# Patient Record
Sex: Female | Born: 1945
Health system: Southern US, Community
[De-identification: ages and names within clinical notes are randomized; demographics above are authoritative.]

## PROBLEM LIST (undated history)

## (undated) DIAGNOSIS — M5126 Other intervertebral disc displacement, lumbar region: Secondary | ICD-10-CM

## (undated) DIAGNOSIS — I10 Essential (primary) hypertension: Secondary | ICD-10-CM

## (undated) DIAGNOSIS — Z860100 Personal history of colon polyps, unspecified: Secondary | ICD-10-CM

## (undated) DIAGNOSIS — F419 Anxiety disorder, unspecified: Secondary | ICD-10-CM

## (undated) DIAGNOSIS — D739 Disease of spleen, unspecified: Secondary | ICD-10-CM

## (undated) DIAGNOSIS — J189 Pneumonia, unspecified organism: Secondary | ICD-10-CM

## (undated) DIAGNOSIS — C4371 Malignant melanoma of right lower limb, including hip: Secondary | ICD-10-CM

## (undated) DIAGNOSIS — E785 Hyperlipidemia, unspecified: Secondary | ICD-10-CM

## (undated) DIAGNOSIS — K635 Polyp of colon: Secondary | ICD-10-CM

## (undated) DIAGNOSIS — Z85820 Personal history of malignant melanoma of skin: Secondary | ICD-10-CM

## (undated) DIAGNOSIS — J309 Allergic rhinitis, unspecified: Secondary | ICD-10-CM

## (undated) DIAGNOSIS — B0050 Herpesviral ocular disease, unspecified: Secondary | ICD-10-CM

## (undated) DIAGNOSIS — K219 Gastro-esophageal reflux disease without esophagitis: Secondary | ICD-10-CM

## (undated) DIAGNOSIS — M8580 Other specified disorders of bone density and structure, unspecified site: Secondary | ICD-10-CM

## (undated) DIAGNOSIS — M1990 Unspecified osteoarthritis, unspecified site: Secondary | ICD-10-CM

## (undated) DIAGNOSIS — I6529 Occlusion and stenosis of unspecified carotid artery: Secondary | ICD-10-CM

## (undated) DIAGNOSIS — K760 Fatty (change of) liver, not elsewhere classified: Secondary | ICD-10-CM

## (undated) DIAGNOSIS — R739 Hyperglycemia, unspecified: Secondary | ICD-10-CM

## (undated) DIAGNOSIS — Z8719 Personal history of other diseases of the digestive system: Secondary | ICD-10-CM

## (undated) HISTORY — DX: Personal history of colonic polyps: Z86.010

## (undated) HISTORY — DX: Allergic rhinitis, unspecified: J30.9

## (undated) HISTORY — DX: Hyperglycemia, unspecified: R73.9

## (undated) HISTORY — DX: Polyp of colon: K63.5

## (undated) HISTORY — DX: Disease of spleen, unspecified: D73.9

## (undated) HISTORY — PX: CARPAL TUNNEL RELEASE: SHX101

## (undated) HISTORY — DX: Malignant melanoma of right lower limb, including hip: C43.71

## (undated) HISTORY — DX: Gastro-esophageal reflux disease without esophagitis: K21.9

## (undated) HISTORY — DX: Other intervertebral disc displacement, lumbar region: M51.26

## (undated) HISTORY — DX: Other specified disorders of bone density and structure, unspecified site: M85.80

## (undated) HISTORY — DX: Personal history of colon polyps, unspecified: Z86.0100

## (undated) HISTORY — PX: NECK SURGERY: SHX720

## (undated) HISTORY — DX: Occlusion and stenosis of unspecified carotid artery: I65.29

## (undated) HISTORY — DX: Fatty (change of) liver, not elsewhere classified: K76.0

## (undated) HISTORY — DX: Personal history of malignant melanoma of skin: Z85.820

## (undated) HISTORY — DX: Hyperlipidemia, unspecified: E78.5

## (undated) HISTORY — PX: TONSILLECTOMY: SUR1361

---

## 1983-10-12 HISTORY — PX: ABDOMINAL HYSTERECTOMY: SHX81

## 1998-03-26 ENCOUNTER — Other Ambulatory Visit: Admission: RE | Admit: 1998-03-26 | Discharge: 1998-03-26 | Payer: Self-pay | Admitting: Obstetrics and Gynecology

## 1999-05-28 ENCOUNTER — Other Ambulatory Visit: Admission: RE | Admit: 1999-05-28 | Discharge: 1999-05-28 | Payer: Self-pay | Admitting: Obstetrics and Gynecology

## 1999-09-18 ENCOUNTER — Encounter: Admission: RE | Admit: 1999-09-18 | Discharge: 1999-09-18 | Payer: Self-pay | Admitting: Obstetrics and Gynecology

## 1999-09-18 ENCOUNTER — Encounter: Payer: Self-pay | Admitting: Obstetrics and Gynecology

## 2000-07-22 ENCOUNTER — Encounter (INDEPENDENT_AMBULATORY_CARE_PROVIDER_SITE_OTHER): Payer: Self-pay | Admitting: Specialist

## 2000-07-22 ENCOUNTER — Other Ambulatory Visit: Admission: RE | Admit: 2000-07-22 | Discharge: 2000-07-22 | Payer: Self-pay | Admitting: Gastroenterology

## 2000-09-20 ENCOUNTER — Encounter: Payer: Self-pay | Admitting: Obstetrics and Gynecology

## 2000-09-20 ENCOUNTER — Encounter: Admission: RE | Admit: 2000-09-20 | Discharge: 2000-09-20 | Payer: Self-pay | Admitting: Obstetrics and Gynecology

## 2000-09-22 ENCOUNTER — Other Ambulatory Visit: Admission: RE | Admit: 2000-09-22 | Discharge: 2000-09-22 | Payer: Self-pay | Admitting: Family Medicine

## 2001-09-21 ENCOUNTER — Encounter: Admission: RE | Admit: 2001-09-21 | Discharge: 2001-09-21 | Payer: Self-pay | Admitting: Family Medicine

## 2001-09-21 ENCOUNTER — Encounter: Payer: Self-pay | Admitting: Family Medicine

## 2001-09-26 ENCOUNTER — Other Ambulatory Visit: Admission: RE | Admit: 2001-09-26 | Discharge: 2001-09-26 | Payer: Self-pay | Admitting: Family Medicine

## 2002-09-25 ENCOUNTER — Encounter: Admission: RE | Admit: 2002-09-25 | Discharge: 2002-09-25 | Payer: Self-pay | Admitting: Family Medicine

## 2002-09-25 ENCOUNTER — Encounter: Payer: Self-pay | Admitting: Family Medicine

## 2003-10-01 ENCOUNTER — Encounter: Admission: RE | Admit: 2003-10-01 | Discharge: 2003-10-01 | Payer: Self-pay | Admitting: Obstetrics and Gynecology

## 2003-11-21 ENCOUNTER — Encounter: Admission: RE | Admit: 2003-11-21 | Discharge: 2003-11-21 | Payer: Self-pay | Admitting: Gastroenterology

## 2004-09-11 ENCOUNTER — Ambulatory Visit: Payer: Self-pay | Admitting: Family Medicine

## 2004-09-21 ENCOUNTER — Ambulatory Visit: Payer: Self-pay | Admitting: Family Medicine

## 2004-09-28 ENCOUNTER — Ambulatory Visit: Payer: Self-pay | Admitting: Family Medicine

## 2004-12-16 ENCOUNTER — Ambulatory Visit: Payer: Self-pay | Admitting: Family Medicine

## 2005-01-06 ENCOUNTER — Ambulatory Visit (HOSPITAL_COMMUNITY): Admission: RE | Admit: 2005-01-06 | Discharge: 2005-01-07 | Payer: Self-pay | Admitting: Orthopaedic Surgery

## 2005-03-09 ENCOUNTER — Ambulatory Visit: Payer: Self-pay | Admitting: Gastroenterology

## 2005-03-11 ENCOUNTER — Ambulatory Visit: Payer: Self-pay | Admitting: Gastroenterology

## 2005-10-11 HISTORY — PX: CHOLECYSTECTOMY: SHX55

## 2007-01-06 ENCOUNTER — Ambulatory Visit: Payer: Self-pay | Admitting: Family Medicine

## 2007-01-06 LAB — CONVERTED CEMR LAB
Albumin: 4.7 g/dL (ref 3.5–5.2)
Alkaline Phosphatase: 57 units/L (ref 39–117)
Calcium: 10.1 mg/dL (ref 8.4–10.5)
Chloride: 100 meq/L (ref 96–112)
Eosinophils Absolute: 0.1 10*3/uL (ref 0.0–0.7)
HCT: 40.6 % (ref 36.0–46.0)
HDL: 56 mg/dL (ref >39)
Hemoglobin: 14.1 g/dL (ref 12.0–15.0)
LDL Cholesterol: 213 mg/dL — ABNORMAL HIGH (ref 0–99)
Lymphocytes Relative: 42 % (ref 12–46)
Lymphs Abs: 2.6 10*3/uL (ref 0.7–3.3)
Monocytes Absolute: 0.4 10*3/uL (ref 0.2–0.7)
Monocytes Relative: 7 % (ref 3–11)
Neutrophils Relative %: 49 % (ref 43–77)
TSH: 2.114 microintl units/mL (ref 0.350–5.50)
Total Bilirubin: 0.4 mg/dL (ref 0.3–1.2)
Total Protein: 7.7 g/dL (ref 6.0–8.3)
Triglycerides: 224 mg/dL — ABNORMAL HIGH (ref <150)
VLDL: 45 mg/dL — ABNORMAL HIGH (ref 0–40)

## 2007-01-14 ENCOUNTER — Emergency Department (HOSPITAL_COMMUNITY): Admission: EM | Admit: 2007-01-14 | Discharge: 2007-01-14 | Payer: Self-pay | Admitting: Emergency Medicine

## 2007-01-16 ENCOUNTER — Ambulatory Visit: Payer: Self-pay | Admitting: Family Medicine

## 2007-01-19 ENCOUNTER — Encounter: Admission: RE | Admit: 2007-01-19 | Discharge: 2007-01-19 | Payer: Self-pay | Admitting: Family Medicine

## 2007-01-20 ENCOUNTER — Ambulatory Visit: Payer: Self-pay | Admitting: Family Medicine

## 2007-02-07 ENCOUNTER — Encounter: Payer: Self-pay | Admitting: Family Medicine

## 2007-02-20 ENCOUNTER — Encounter: Admission: RE | Admit: 2007-02-20 | Discharge: 2007-02-20 | Payer: Self-pay | Admitting: General Surgery

## 2007-02-23 ENCOUNTER — Encounter: Payer: Self-pay | Admitting: Family Medicine

## 2007-02-23 DIAGNOSIS — K219 Gastro-esophageal reflux disease without esophagitis: Secondary | ICD-10-CM | POA: Insufficient documentation

## 2007-02-23 DIAGNOSIS — I10 Essential (primary) hypertension: Secondary | ICD-10-CM | POA: Insufficient documentation

## 2007-02-23 DIAGNOSIS — M1990 Unspecified osteoarthritis, unspecified site: Secondary | ICD-10-CM | POA: Insufficient documentation

## 2007-03-03 ENCOUNTER — Telehealth (INDEPENDENT_AMBULATORY_CARE_PROVIDER_SITE_OTHER): Payer: Self-pay | Admitting: *Deleted

## 2007-03-08 ENCOUNTER — Ambulatory Visit: Payer: Self-pay | Admitting: Gastroenterology

## 2007-05-01 ENCOUNTER — Emergency Department (HOSPITAL_COMMUNITY): Admission: EM | Admit: 2007-05-01 | Discharge: 2007-05-01 | Payer: Self-pay | Admitting: Family Medicine

## 2007-06-06 ENCOUNTER — Ambulatory Visit (HOSPITAL_COMMUNITY): Admission: RE | Admit: 2007-06-06 | Discharge: 2007-06-06 | Payer: Self-pay | Admitting: General Surgery

## 2007-06-06 ENCOUNTER — Encounter: Payer: Self-pay | Admitting: Family Medicine

## 2007-06-06 ENCOUNTER — Encounter (INDEPENDENT_AMBULATORY_CARE_PROVIDER_SITE_OTHER): Payer: Self-pay | Admitting: General Surgery

## 2007-07-11 ENCOUNTER — Encounter: Payer: Self-pay | Admitting: Family Medicine

## 2007-11-02 ENCOUNTER — Ambulatory Visit: Payer: Self-pay | Admitting: Family Medicine

## 2007-11-02 DIAGNOSIS — R519 Headache, unspecified: Secondary | ICD-10-CM | POA: Insufficient documentation

## 2007-11-02 DIAGNOSIS — R51 Headache: Secondary | ICD-10-CM | POA: Insufficient documentation

## 2007-11-08 ENCOUNTER — Ambulatory Visit: Payer: Self-pay | Admitting: Family Medicine

## 2007-11-14 LAB — CONVERTED CEMR LAB
BUN: 12 mg/dL (ref 6–23)
Chloride: 104 meq/L (ref 96–112)
Creatinine, Ser: 0.5 mg/dL (ref 0.4–1.2)
GFR calc non Af Amer: 133 mL/min
Glucose, Bld: 101 mg/dL — ABNORMAL HIGH (ref 70–99)
Triglycerides: 153 mg/dL — ABNORMAL HIGH (ref 0–149)
VLDL: 31 mg/dL (ref 0–40)

## 2007-12-04 ENCOUNTER — Ambulatory Visit: Payer: Self-pay | Admitting: Family Medicine

## 2008-01-02 ENCOUNTER — Ambulatory Visit: Payer: Self-pay | Admitting: Family Medicine

## 2008-01-02 DIAGNOSIS — R300 Dysuria: Secondary | ICD-10-CM | POA: Insufficient documentation

## 2008-01-02 LAB — CONVERTED CEMR LAB
Bacteria, UA: 0
Bilirubin Urine: NEGATIVE
Blood in Urine, dipstick: NEGATIVE
Casts: 0 /lpf
Epithelial cells, urine: 1 /lpf
Glucose, Urine, Semiquant: NEGATIVE
Ketones, urine, test strip: NEGATIVE
Mucus, UA: 0
Specific Gravity, Urine: 1.02
Urine crystals, microscopic: 0 /hpf

## 2008-02-06 ENCOUNTER — Telehealth: Payer: Self-pay | Admitting: Family Medicine

## 2008-03-06 ENCOUNTER — Ambulatory Visit: Payer: Self-pay | Admitting: Family Medicine

## 2008-03-06 ENCOUNTER — Telehealth (INDEPENDENT_AMBULATORY_CARE_PROVIDER_SITE_OTHER): Payer: Self-pay | Admitting: *Deleted

## 2008-03-11 ENCOUNTER — Encounter: Payer: Self-pay | Admitting: Family Medicine

## 2008-03-11 LAB — CONVERTED CEMR LAB
AST: 33 units/L (ref 0–37)
Albumin: 4 g/dL (ref 3.5–5.2)
BUN: 15 mg/dL (ref 6–23)
Basophils Absolute: 0.1 10*3/uL (ref 0.0–0.1)
Basophils Relative: 0.8 % (ref 0.0–1.0)
CO2: 30 meq/L (ref 19–32)
Chloride: 105 meq/L (ref 96–112)
Direct LDL: 192.4 mg/dL
Eosinophils Absolute: 0.2 10*3/uL (ref 0.0–0.7)
GFR calc Af Amer: 109 mL/min
HCT: 39.3 % (ref 36.0–46.0)
HDL: 43.7 mg/dL (ref >39.0)
Hemoglobin: 13.3 g/dL (ref 12.0–15.0)
MCV: 92.3 fL (ref 78.0–100.0)
Monocytes Relative: 8.6 % (ref 3.0–12.0)
Phosphorus: 4.4 mg/dL (ref 2.3–4.6)
Platelets: 180 10*3/uL (ref 150–400)
Potassium: 4.3 meq/L (ref 3.5–5.1)
RBC: 4.25 M/uL (ref 3.87–5.11)
RDW: 12.5 % (ref 11.5–14.6)
TSH: 1.54 microintl units/mL (ref 0.35–5.50)
Triglycerides: 245 mg/dL (ref 0–149)
WBC: 6.3 10*3/uL (ref 4.5–10.5)

## 2008-05-03 ENCOUNTER — Ambulatory Visit: Payer: Self-pay

## 2008-05-07 ENCOUNTER — Ambulatory Visit: Payer: Self-pay | Admitting: Family Medicine

## 2008-06-13 ENCOUNTER — Ambulatory Visit: Payer: Self-pay | Admitting: Family Medicine

## 2008-06-18 LAB — CONVERTED CEMR LAB
AST: 24 units/L (ref 0–37)
Albumin: 4.1 g/dL (ref 3.5–5.2)
CO2: 32 meq/L (ref 19–32)
Calcium: 9.5 mg/dL (ref 8.4–10.5)
Chloride: 106 meq/L (ref 96–112)
Cholesterol: 293 mg/dL (ref 0–200)
Creatinine, Ser: 0.8 mg/dL (ref 0.4–1.2)
HDL: 54.1 mg/dL (ref >39.0)
Hgb A1c MFr Bld: 6.6 % — ABNORMAL HIGH (ref 4.6–6.0)
Phosphorus: 4.7 mg/dL — ABNORMAL HIGH (ref 2.3–4.6)
Sodium: 143 meq/L (ref 135–145)
Triglycerides: 94 mg/dL (ref 0–149)
VLDL: 19 mg/dL (ref 0–40)

## 2008-06-21 ENCOUNTER — Ambulatory Visit: Payer: Self-pay | Admitting: Family Medicine

## 2008-06-21 DIAGNOSIS — E118 Type 2 diabetes mellitus with unspecified complications: Secondary | ICD-10-CM | POA: Insufficient documentation

## 2008-06-21 DIAGNOSIS — E1169 Type 2 diabetes mellitus with other specified complication: Secondary | ICD-10-CM | POA: Insufficient documentation

## 2008-06-21 DIAGNOSIS — E119 Type 2 diabetes mellitus without complications: Secondary | ICD-10-CM | POA: Insufficient documentation

## 2008-06-25 ENCOUNTER — Ambulatory Visit: Payer: Self-pay | Admitting: Family Medicine

## 2008-06-26 LAB — CONVERTED CEMR LAB
Albumin: 4.1 g/dL (ref 3.5–5.2)
BUN: 19 mg/dL (ref 6–23)
CO2: 30 meq/L (ref 19–32)
Glucose, Bld: 94 mg/dL (ref 70–99)
Sodium: 142 meq/L (ref 135–145)

## 2008-07-22 ENCOUNTER — Encounter: Admission: RE | Admit: 2008-07-22 | Discharge: 2008-07-22 | Payer: Self-pay | Admitting: Family Medicine

## 2008-08-02 ENCOUNTER — Ambulatory Visit: Payer: Self-pay | Admitting: Family Medicine

## 2008-08-06 LAB — CONVERTED CEMR LAB
AST: 37 units/L (ref 0–37)
HDL: 54.5 mg/dL (ref >39.0)
Potassium: 5.1 meq/L (ref 3.5–5.1)
Total CHOL/HDL Ratio: 4
Triglycerides: 89 mg/dL (ref 0–149)

## 2009-07-08 ENCOUNTER — Ambulatory Visit: Payer: Self-pay | Admitting: Family Medicine

## 2009-07-11 LAB — CONVERTED CEMR LAB
Albumin: 4.3 g/dL (ref 3.5–5.2)
Basophils Relative: 0.3 % (ref 0.0–3.0)
CO2: 30 meq/L (ref 19–32)
Calcium: 9.5 mg/dL (ref 8.4–10.5)
Chloride: 110 meq/L (ref 96–112)
Creatinine, Ser: 0.7 mg/dL (ref 0.4–1.2)
Direct LDL: 141.9 mg/dL
Eosinophils Absolute: 0.2 10*3/uL (ref 0.0–0.7)
Eosinophils Relative: 4 % (ref 0.0–5.0)
GFR calc non Af Amer: 89.66 mL/min (ref >60)
Glucose, Bld: 111 mg/dL — ABNORMAL HIGH (ref 70–99)
Hgb A1c MFr Bld: 6.4 % (ref 4.6–6.5)
Lymphs Abs: 2.7 10*3/uL (ref 0.7–4.0)
MCV: 90.9 fL (ref 78.0–100.0)
Monocytes Absolute: 0.5 10*3/uL (ref 0.1–1.0)
Monocytes Relative: 8.1 % (ref 3.0–12.0)
Neutro Abs: 2.6 10*3/uL (ref 1.4–7.7)
RBC: 4.46 M/uL (ref 3.87–5.11)
Sodium: 145 meq/L (ref 135–145)
TSH: 2.33 microintl units/mL (ref 0.35–5.50)
Total CHOL/HDL Ratio: 4
WBC: 6 10*3/uL (ref 4.5–10.5)

## 2009-07-23 ENCOUNTER — Ambulatory Visit: Payer: Self-pay | Admitting: Family Medicine

## 2009-07-24 ENCOUNTER — Encounter: Admission: RE | Admit: 2009-07-24 | Discharge: 2009-07-24 | Payer: Self-pay | Admitting: Family Medicine

## 2009-07-28 ENCOUNTER — Encounter (INDEPENDENT_AMBULATORY_CARE_PROVIDER_SITE_OTHER): Payer: Self-pay | Admitting: *Deleted

## 2009-07-30 ENCOUNTER — Encounter: Admission: RE | Admit: 2009-07-30 | Discharge: 2009-07-30 | Payer: Self-pay | Admitting: Family Medicine

## 2009-08-06 ENCOUNTER — Ambulatory Visit: Payer: Self-pay | Admitting: Family Medicine

## 2009-08-06 ENCOUNTER — Encounter: Payer: Self-pay | Admitting: Family Medicine

## 2009-08-06 ENCOUNTER — Other Ambulatory Visit: Admission: RE | Admit: 2009-08-06 | Discharge: 2009-08-06 | Payer: Self-pay | Admitting: Family Medicine

## 2009-08-06 DIAGNOSIS — L259 Unspecified contact dermatitis, unspecified cause: Secondary | ICD-10-CM | POA: Insufficient documentation

## 2009-08-06 LAB — CONVERTED CEMR LAB: KOH Prep: NEGATIVE

## 2009-08-12 ENCOUNTER — Encounter (INDEPENDENT_AMBULATORY_CARE_PROVIDER_SITE_OTHER): Payer: Self-pay | Admitting: *Deleted

## 2009-09-17 ENCOUNTER — Encounter (INDEPENDENT_AMBULATORY_CARE_PROVIDER_SITE_OTHER): Payer: Self-pay | Admitting: *Deleted

## 2010-07-27 ENCOUNTER — Encounter: Admission: RE | Admit: 2010-07-27 | Discharge: 2010-07-27 | Payer: Self-pay | Admitting: Family Medicine

## 2010-09-08 ENCOUNTER — Ambulatory Visit (HOSPITAL_BASED_OUTPATIENT_CLINIC_OR_DEPARTMENT_OTHER): Admission: RE | Admit: 2010-09-08 | Discharge: 2010-09-08 | Payer: Self-pay | Admitting: Orthopaedic Surgery

## 2010-09-25 ENCOUNTER — Encounter (INDEPENDENT_AMBULATORY_CARE_PROVIDER_SITE_OTHER): Payer: Self-pay | Admitting: *Deleted

## 2010-09-29 ENCOUNTER — Encounter (INDEPENDENT_AMBULATORY_CARE_PROVIDER_SITE_OTHER): Payer: Self-pay | Admitting: *Deleted

## 2010-10-02 ENCOUNTER — Ambulatory Visit: Payer: Self-pay | Admitting: Internal Medicine

## 2010-10-11 HISTORY — PX: CORNEAL TRANSPLANT: SHX108

## 2010-10-11 HISTORY — PX: COLONOSCOPY: SHX174

## 2010-10-15 ENCOUNTER — Encounter: Payer: Self-pay | Admitting: Internal Medicine

## 2010-10-15 ENCOUNTER — Ambulatory Visit
Admission: RE | Admit: 2010-10-15 | Discharge: 2010-10-15 | Payer: Self-pay | Source: Home / Self Care | Attending: Internal Medicine | Admitting: Internal Medicine

## 2010-10-20 ENCOUNTER — Encounter: Payer: Self-pay | Admitting: Internal Medicine

## 2010-11-12 NOTE — Miscellaneous (Signed)
Summary: previsit prep/rm  Clinical Lists Changes  Medications: Added new medication of MOVIPREP 100 GM  SOLR (PEG-KCL-NACL-NASULF-NA ASC-C) As per prep instructions. - Signed Rx of MOVIPREP 100 GM  SOLR (PEG-KCL-NACL-NASULF-NA ASC-C) As per prep instructions.;  #1 x 0;  Signed;  Entered by: Sherren Kerns RN;  Authorized by: Iva Boop MD, [facility];  Method used: Electronically to [facility]*, [address], [place], [place]  [address], Ph: [phone], Fax: [phone] Observations: Added new observation of ALLERGY REV: Done (10/02/2010 9:55)    Prescriptions: MOVIPREP 100 GM  SOLR (PEG-KCL-NACL-NASULF-NA ASC-C) As per prep instructions.  #1 x 0   Entered by:   Sherren Kerns RN   Authorized by:   Iva Boop MD, [facility]   Signed by:   Sherren Kerns RN on 10/02/2010   Method used:   Electronically to        [facility]* (retail)       [address]       [place], [place]  [address]       Ph: [phone]       Fax: [phone]   RxID:   [id number]

## 2010-11-12 NOTE — Letter (Signed)
Summary: Patient Notice- Polyp Results  [facility]  [address] [place], [place] [address]   Phone: [phone]  Fax: [phone]        October 20, 2010 MRN: [id number]    KATELIN KUTSCH [address] [place], [place]  [address]    Dear Ms. Virgia Land,  The polyp removed from your colon was adenomatous. This means that it was pre-cancerous or that  it had the potential to change into cancer over time.  I recommend that you have a repeat colonoscopy in 5 years to determine if you have developed any new polyps over time and screen for colorectal cancer. If you develop any new rectal bleeding, abdominal pain or significant bowel habit changes, please contact us before then.  In addition to repeating colonoscopy, changing health habits may reduce your risk of having more colon or rectal  polyps and possibly, colorectal cancer. You may lower your risk of future polyps and colorectal cancer by adopting healthy habits such as not smoking or using tobacco (if you do), being physically active, losing weight (if overweight), and eating a diet which includes fruits and vegetables and limits red meat.  Please call us if you are having persistent problems or have questions about your condition that have not been fully answered at this time.  Sincerely,  Iva Boop MD, [facility]  This letter has been electronically signed by your physician.  Appended Document: Patient Notice- Polyp Results Letter mailed

## 2010-11-12 NOTE — Letter (Signed)
Summary: [facility] Instructions  [facility]  [address] [place], [place] [address]   Phone: [phone]  Fax: [phone]       Beth Bailey    October 05, 1946    MRN: [id number]        Procedure Day Dorna Bloom:  Lenor Coffin  10/15/10     Arrival Time:  3:00PM     Procedure Time:  4:00PM     Location of Procedure:                    _ X_  [facility] (4th Floor)  PREPARATION FOR COLONOSCOPY WITH MOVIPREP   Starting 5 days prior to your procedure 10/10/10 do not eat nuts, seeds, popcorn, corn, beans, peas,  salads, or any raw vegetables.  Do not take any fiber supplements (e.g. Metamucil, Citrucel, and Benefiber).  THE DAY BEFORE YOUR PROCEDURE         DATE: 10/14/10   DAY: WEDNESDAY  1.  Drink clear liquids the entire day-NO SOLID FOOD  2.  Do not drink anything colored red or purple.  Avoid juices with pulp.  No orange juice.  3.  Drink at least 64 oz. (8 glasses) of fluid/clear liquids during the day to prevent dehydration and help the prep work efficiently.  CLEAR LIQUIDS INCLUDE: Water Jello Ice Popsicles Tea (sugar ok, no milk/cream) Powdered fruit flavored drinks Coffee (sugar ok, no milk/cream) Gatorade Juice: apple, white grape, white cranberry  Lemonade Clear bullion, consomm, broth Carbonated beverages (any kind) Strained chicken noodle soup Hard Candy                             4.  In the morning, mix first dose of MoviPrep solution:    Empty 1 Pouch A and 1 Pouch B into the disposable container    Add lukewarm drinking water to the top line of the container. Mix to dissolve    Refrigerate (mixed solution should be used within 24 hrs)  5.  Begin drinking the prep at 5:00 p.m. The MoviPrep container is divided by 4 marks.   Every 15 minutes drink the solution down to the next mark (approximately 8 oz) until the full liter is complete.   6.  Follow completed prep with 16 oz of clear liquid of your choice (Nothing red or purple).   Continue to drink clear liquids until bedtime.  7.  Before going to bed, mix second dose of MoviPrep solution:    Empty 1 Pouch A and 1 Pouch B into the disposable container    Add lukewarm drinking water to the top line of the container. Mix to dissolve    Refrigerate  THE DAY OF YOUR PROCEDURE      DATE: 10/15/10  DAY: THURSDAY  Beginning at 11:00AM (5 hours before procedure):         1. Every 15 minutes, drink the solution down to the next mark (approx 8 oz) until the full liter is complete.  2. Follow completed prep with 16 oz. of clear liquid of your choice.    3. You may drink clear liquids until 2:00PM (2 HOURS BEFORE PROCEDURE).   MEDICATION INSTRUCTIONS  Unless otherwise instructed, you should take regular prescription medications with a small sip of water   as early as possible the morning of your procedure.        OTHER INSTRUCTIONS  You will need a responsible adult at least  65 years of age to accompany you and drive you home.   This person must remain in the waiting room during your procedure.  Wear loose fitting clothing that is easily removed.  Leave jewelry and other valuables at home.  However, you may wish to bring a book to read or  an iPod/MP3 player to listen to music as you wait for your procedure to start.  Remove all body piercing jewelry and leave at home.  Total time from sign-in until discharge is approximately 2-3 hours.  You should go home directly after your procedure and rest.  You can resume normal activities the  day after your procedure.  The day of your procedure you should not:   Drive   Make legal decisions   Operate machinery   Drink alcohol   Return to work  You will receive specific instructions about eating, activities and medications before you leave.    The above instructions have been reviewed and explained to me by  Sherren Kerns RN  October 02, 2010 10:33 AM     I fully understand and can verbalize these  instructions _____________________________ Date _________

## 2010-11-12 NOTE — Procedures (Signed)
Summary: Colonoscopy  Patient: Beth Bailey Note: All result statuses are Final unless otherwise noted.  Tests: (1) Colonoscopy (COL)   COL Colonoscopy           DONE     [facility]     [address]     [place], [place]  [address]           COLONOSCOPY PROCEDURE REPORT           PATIENT:  Beth, Bailey  MR#:  [id number]     BIRTHDATE:  [birth year], 65 yrs. old  GENDER:  female     ENDOSCOPIST:  Iva Boop, MD, [facility]           PROCEDURE DATE:  10/15/2010     PROCEDURE:  Colonoscopy with snare polypectomy     ASA CLASS:  Class II     INDICATIONS:  surveillance and high-risk screening, history of     pre-cancerous (adenomatous) colon polyps, family history of colon     cancer two prior adenomas (max 12mm) in 2001 and a 3 mm polyp     destroyed in 2006           sister diagnosed with colon cancer at age 56     MEDICATIONS:   Fentanyl 75 mcg IV, Versed 8 mg           DESCRIPTION OF PROCEDURE:   After the risks benefits and     alternatives of the procedure were thoroughly explained, informed     consent was obtained.  Digital rectal exam was performed and     revealed no abnormalities.   The LB [id number]-Q180AL [id number] endoscope     was introduced through the anus and advanced to the cecum, which     was identified by both the appendix and ileocecal valve, without     limitations.  The quality of the prep was excellent, using     MoviPrep.  The instrument was then slowly withdrawn as the colon     was fully examined. Insertion: 5:42 minutes Withdrawal: 10:05     minutes     <<PROCEDUREIMAGES>>           FINDINGS:  A diminutive polyp was found in the ascending colon.     Polyp was snared without cautery. Retrieval was successful. Mild     diverticulosis was found in the sigmoid colon.  This was otherwise     a normal examination of the colon including right colon     retroflexion.   Retroflexed views in the rectum revealed internal     hemorrhoids.    The scope was  then withdrawn from the patient and     the procedure completed.           COMPLICATIONS:  None     ENDOSCOPIC IMPRESSION:     1) Diminutive polyp in the ascending colon - removed     2) Mild diverticulosis in the sigmoid colon     3) Otherwise normal examination of colon with excellent prep     4) Internal hemorrhoids in the rectum     5) Personal history of adenomatous polyps     6) Family history of colon cancer           REPEAT EXAM:  In for Colonoscopy, pending biopsy results.           Iva Boop, MD, [facility]           CC:  Laurann Montana, MD     The Patient           n.     eSIGNED:   Iva Boop at 10/15/2010 04:28 PM           Heath Gold, [id number]  Note: An exclamation mark (!) indicates a result that was not dispersed into the flowsheet. Document Creation Date: 10/15/2010 4:28 PM _______________________________________________________________________  (1) Order result status: Final Collection or observation date-time: 10/15/2010 16:18 Requested date-time:  Receipt date-time:  Reported date-time:  Referring Physician:   Ordering Physician: Stan Head [phone]) Specimen Source:  Source: Launa Grill Order Number: [id number] Lab site:   Appended Document: Colonoscopy     Procedures Next Due Date:    Colonoscopy: 10/2015

## 2010-11-12 NOTE — Letter (Signed)
Summary: Pre Visit Letter Revised  [facility]  [address] [place], [place] [address]   Phone: [phone]  Fax: [phone]        09/25/2010 MRN: [id number] [facility] [address] [place], [place]  [address]             Procedure Date:  10/15/2010   Welcome to the Gastroenterology Division at [facility].    You are scheduled to see a nurse for your pre-procedure visit on 10/02/2010 at 10:30 on the 3rd floor at [facility], [address]  We ask that you try to arrive at our office 15 minutes prior to your appointment time to allow for check-in.  Please take a minute to review the attached form.  If you answer "Yes" to one or more of the questions on the first page, we ask that you call the person listed at your earliest opportunity.  If you answer "No" to all of the questions, please complete the rest of the form and bring it to your appointment.    Your nurse visit will consist of discussing your medical and surgical history, your immediate family medical history, and your medications.   If you are unable to list all of your medications on the form, please bring the medication bottles to your appointment and we will list them.  We will need to be aware of both prescribed and over the counter drugs.  We will need to know exact dosage information as well.    Please be prepared to read and sign documents such as consent forms, a financial agreement, and acknowledgement forms.  If necessary, and with your consent, a friend or relative is welcome to sit-in on the nurse visit with you.  Please bring your insurance card so that we may make a copy of it.  If your insurance requires a referral to see a specialist, please bring your referral form from your primary care physician.  No co-pay is required for this nurse visit.     If you cannot keep your appointment, please call [phone] to cancel or reschedule prior to your appointment date.  This allows  [place] the opportunity to schedule an appointment for another patient in need of care.    Thank you for choosing [facility] for your medical needs.  We appreciate the opportunity to care for you.  Please visit [place] at our website  to learn more about our practice.  Sincerely, The Gastroenterology Division

## 2010-12-22 LAB — POCT HEMOGLOBIN-HEMACUE: Hemoglobin: 15.7 g/dL — ABNORMAL HIGH (ref 12.0–15.0)

## 2011-02-23 NOTE — Letter (Signed)
Mar 08, 2007    Adolph Pollack, M.D.  [address], [address]  [place], [place] [address]   RE:  KATENA, PETITJEAN  MRN:  [id number]  /  DOB:  March 29, 1946   Dear Tawanna Cooler,   I saw this very nice patient that we share, and I agree with you about  her gallbladder.  I am not convinced that doing the surgical procedure  will necessarily relieve all of her symptoms, and I indicated that to  her, but I think that it is probably the best thing to do at this point  in time.  I really do not think we would gain a great deal from doing  the upper endoscopy, since I did see the same polypoid-like lesion in  her stomach a number of years previous, which had evidence of a benign  gastric polyp.   I did double up on her proton pump inhibitor.  Hopefully this will be  helpful to her.   I think that she also should be seen because of the chest discomfort,  although atypical, by a cardiologist and probably have a stress test  prior to her surgical procedure.   I hope the above will be helpful to you if an upper endoscopy needs to  be done, in your mind, then I think we could always proceed with Dr.  Leone Payor or one of my other associates, doing this for her.    Sincerely,      Ulyess Mort, MD  Electronically Signed   SML/MedQ  DD: 03/08/2007  DT: 03/09/2007  Job #: [id number]

## 2011-02-23 NOTE — Assessment & Plan Note (Signed)
[facility]                         GASTROENTEROLOGY OFFICE NOTE   NAME:[name], [name]                     MRN:          [id number]  DATE:03/08/2007                            DOB:          September 13, 1946    Beth Bailey comes in with her doctor's notes that she has recently seen.  There  was an upper GI series that revealed a small hiatal hernia with some  reflux and a small polypoid lesion of the gastric body, which was  suspected to be a very small gastric polyp, which I had seen several  years before on upper endoscopy.  She was seen by Dr. Avel Peace  recently on February 07, 2007.  He described her as a 65 year old who had  some left chest discomfort that was exacerbated with deep breaths.  She  presented to the emergency department, and a myocardial infarction was  ruled out.  She presented subsequently to Dr. Royden Purl office, as she had  some chest wall pain to palpation.  She was complaining of some upper  abdominal pain and was noted to have some right upper quadrant  tenderness as well.  There was no Murphy's sign.  She was sent for an  abdominal ultrasound.  This demonstrated some cholelithiasis.  No  gallbladder wall thickening and normal gallbladder.  She did have some  hepatic cysts.  She also had some cholelithiasis with no gallbladder  wall thickening with a normal common bile duct.   She does state that she has had some epigastric pain, some nausea after  eating, GERD, and does have a history of hiatal hernia, has intermittent  diarrhea.   Her past medical history reveals she has had hypertension, hiatal  hernia, GERD, herpes, arthritis, hypercholesterolemia.  She is status  post hysterectomy and neck surgery.  Status post allergies.  Status post  arthritis.   She is allergic to PROTONIX and ZYRTEC as well as ANTIHYPERTENSIVE.   SOCIAL HISTORY:  Noncontributory.   REVIEW OF SYSTEMS:  Noncontributory except for back pain.   PHYSICAL  EXAMINATION:  VITAL SIGNS:  She weighed 133.  Blood pressure  130/72, pulse 78 and regular.  NECK:  Unremarkable.  HEART:  Unremarkable.  ABDOMEN:  Some tenderness on palpation of the epigastric region, but it  was not classic for gallbladder disease nor secondary to what I would  consider PT syndrome.  EXTREMITIES:  Unremarkable.   IMPRESSION:  1. Cholelithiasis.  2. Chest pain and epigastric pain.  Discussed radiology.  3. Questionable gastric polyp that was seen previously on endoscopy      and on recent upper gastrointestinal series, which really had not      changed much in size over the years.  4. Chest discomfort of questionable radiology, rule out      arteriosclerotic coronary vascular disease with ischemia changes.   RECOMMENDATIONS:  My recommendation is that she not have an upper  endoscopy at this time, take two PPIs daily, one in the morning and one  at dinnertime, to see if this makes any difference in her symptoms, and  consider Dr. Abbey Chatters doing a laparoscopic cholecystectomy  because of  her gallstones, even though the walls of her gallbladder are not  thickened.  I told her that there was a good chance that her symptoms  could be relieved with the laparoscopic cholecystectomy that currently  was not 100% but hopefully she might get some relief, and Dr. Abbey Chatters  was an Librarian, academic and could assess some of her abdominal  contents at the time of her laparoscopic procedure.  I did also suggest  to her that she possible see a cardiologist and get a stress test prior  to having surgery.     Ulyess Mort, MD  Electronically Signed    SML/MedQ  DD: 03/08/2007  DT: 03/09/2007  Job #: [id number]   cc:   Adolph Pollack, M.D.

## 2011-02-23 NOTE — Op Note (Signed)
NAMEHENDEL, GATLIFF NO.:  [id number]   MEDICAL RECORD NO.:  [id number]          PATIENT TYPE:  AMB   LOCATION:  DAY                          FACILITY[facility]  [facility]   PHYSICIAN:  Adolph Pollack, M.D.DATE OF BIRTH:  February 03, 1946   DATE OF PROCEDURE:  06/06/2007  DATE OF DISCHARGE:                               OPERATIVE REPORT   PREOPERATIVE DIAGNOSIS:  Symptomatic cholelithiasis.   POSTOPERATIVE DIAGNOSIS:  Symptomatic cholelithiasis.   PROCEDURE:  Laparoscopic cholecystectomy intraoperative cholangiogram,   SURGEON:  Adolph Pollack, M.D.   ASSISTANT:  Sandria Bales. Ezzard Standing, M.D.   ANESTHESIA:  General endotracheal anesthesia.   INDICATIONS:  Mrs. Montanaro is a 65 year old female who has had some  left chest discomfort, had myocardial infarction ruled out.  She is also  noted to have right upper quadrant pain and tenderness and abdominal  ultrasound demonstrated cholelithiasis.  Liver function tests were  normal.  She has some gastroesophageal reflux and history of hiatal  hernia.  Upper GI series demonstrated a small hiatal hernia with a  little bit of reflux and it suggested a possible gastric polyp.  I sent  her to DrCorinda Gubler and she was symptomatic with gallbladder disease who  wished to do the laparoscopic cholecystectomy and investigate the  gastric polyp at a later time.  Thus, she  presents for this.  We  discussed the procedure and risks preoperatively.   DESCRIPTION OF PROCEDURE:  She is seen in the holding area, brought to  the operating room, placed supine on the operating table and general  anesthetic was administered.  Her abdominal wall was then sterilely  prepped and draped.  Dilute Marcaine was infiltrated in the subumbilical  region and a small subumbilical incision was made through skin,  subcutaneous tissue, fascia and peritoneum, entering the peritoneal  cavity under direct vision.  A pursestring suture of 0 Vicryl placed  around the  fascial edges.  A Hassan trocar was introduced into the  peritoneal cavity and pneumoperitoneum was created by insufflation of  CO2 gas.   Next, laparoscope was introduced and no underlying visceral injury was  noted.  She was placed in reverse Trendelenburg position, the right side  tilted slightly up.  An 11 mm trocar was placed in the epigastric  incision and two 5 mm trocars placed in the right upper quadrant.  The  fundus of the gallbladder was grasped and retracted in the right  shoulder.  No significant inflammatory changes or adhesions were noted.  The infundibulum was grasped using dissection carefully on the  gallbladder, I mobilized the infundibulum.  I isolated the cystic duct  and anterior branch of the cystic artery.  I created a window around  both these.  The anterior branch of the cystic artery was clipped and  divided.  I then placed a clip at the cystic duct/gallbladder junction  and made a small incision in the cystic duct.  A cholangiocatheter was  passed through the anterior abdominal wall, placed into the cystic duct  and cholangiogram performed.   Under real time fluoroscopy, dilute contrast was injected into the  cystic duct which was of moderate length. The common hepatic, right and  left hepatic, and common bile ducts filled promptly.  Contrast drained  into the duodenum without obvious evidence of obstruction.  Final  reports pending the radiologist interpretation.   The cholangiocath was removed, the cystic duct was clipped three times  proximally and then divided.  The gallbladder was partially dissected  from the liver and the posterior branch of the cystic artery was  identified, clipped and divided.  The gallbladder dissected the rest of  the way from the liver using electrocautery with minimal bile spillage.  It was placed in an Endopouch bag.   I irrigated out the gallbladder fossa and bleeding was controlled with  electrocautery.  Once hemostasis  was adequate, I reinspected the area  and no bile leak or bleeding was noted.  Surgicel was placed in the  gallbladder fossa.   I copiously irrigated out perihepatic area and evacuated fluid.  The  gallbladder was removed through the subumbilical port in the Endopouch  bag and sent to pathology.  The subumbilical fascial defect was closed  under laparoscopic vision by tightening up and tying down the  pursestring suture.  The remaining trocars were removed and  pneumoperitoneum released.   The skin incisions were closed with 4-0 Monocryl subcuticular stitches  followed by Steri-Strips.  Sterile dressings.  She tolerated the  procedure well without any apparent complications and was taken to  recovery in satisfactory condition.      Adolph Pollack, M.D.  Electronically Signed     TJR/MEDQ  D:  06/06/2007  T:  06/07/2007  Job:  [id number]   cc:   Marne A. Tower, MD  [address] [place], [place] [address]   Ulyess Mort, MD  [address]  [place]  [place] [address]

## 2011-02-26 NOTE — Op Note (Signed)
[name], Bailey NO.:  [id number]   MEDICAL RECORD NO.:  [id number]          PATIENT TYPE:  OIB   LOCATION:  2899                         FACILITY:  [facility]   PHYSICIAN:  Sharolyn Douglas, M.D.        DATE OF BIRTH:  1946-05-07   DATE OF PROCEDURE:  01/06/2005  DATE OF DISCHARGE:                                 OPERATIVE REPORT   DIAGNOSIS:  Cervical spondylotic radiculopathy C5-6 and C6-7.   PROCEDURE:  1.  Anterior cervical diskectomy C5-6, C6-7.  2.  Anterior cervical arthrodesis C5-6, C6-7 and placement of two allograft      prosthesis spacers packed with local autogenous bone graft.  3.  Anterior cervical plating C5 through C7 using the spinal concepts      system.   SURGEON:  Sharolyn Douglas, M.D.   ASSISTANT:  Verlin Fester, P.A.   ANESTHESIA:  General endotracheal.   COMPLICATIONS:  None.   ESTIMATED BLOOD LOSS:  Minimal.   INDICATIONS:  The patient is a pleasant 65 year old female with persistent  severe neck and bilateral upper extremity pain felt to be secondary to  cervical spondylosis at C5-6 and C6-7. She has elected to undergo ACDF at  these levels in hopes of improving her symptoms. We reviewed the risks and  benefits, she elected to proceed.   PROCEDURE:  The patient was properly identified in the holding area, taken  to the operating room and underwent general endotracheal anesthesia without  difficulty, given prophylactic IV antibiotics. Carefully positioned with her  neck in slight extension using the [place]field head rest. 5 pounds of halter  traction applied. Neck was prepped, draped usual sterile fashion. A  transverse incision was made left side level of the cricoid cartilage.  Dissection was carried sharply through platysma. The interval between the  SCM and strap muscles medially was developed down to the prevertebral space.  The esophagus, trachea, carotid sheath were identified and protected at all  times. Intraoperative x-ray taken  confirming our location. We then elevated  the longus coli muscle out over the C5-6 and C6-7 disk spaces. There were  large anterior osteophytes which were removed with a Leksell rongeur. Deep  shadowline retractor was placed. Caspar distraction pins placed in C5, C6  and C7 vertebral bodies. Gentle distraction applied. Diskectomy was carried  back to the posterior longitudinal ligament starting at C6-7. The disk space  was severely narrowed. We were able to use the high-speed bur to take down  the osteophytes and drill out the uncovertebral joints. The posterior  longitudinal ligament was taken down and wide foraminotomies were carefully  created bilaterally. We then placed a 6 mm allograft prosthesis spacer which  was packed with local bone graft from the drill shavings. This was  countersunk 1 mm. We then performed a similar procedure at C5-6. Again the  disk space was narrowed, the disk material was degenerative. The  uncovertebral joints were hypertrophied and completely drilled out and wide  foraminotomies completed. We then placed another 6 mm spacer which had been  packed with the bone graft into the interspace and  countersunk at 1 mm. We  then placed a 38 mm anterior cervical plate from M5-H8 using six 12 mm  screws. We had good screw purchase. We ensured that the locking mechanism  engaged. The wound was irrigated. The esophagus, trachea, carotid sheath  examined. There were no apparent injuries. We placed a deep Penrose drain.  Bleeding was controlled with bipolar electrocautery and FloSeal. The  platysma closed with interrupted 2-0 Vicryl, subcutaneous layer closed with  interrupted 3-0 Vicryl, followed by a running 4-0 subcuticular Vicryl suture  on the skin edges. Benzoin, Steri-Strips placed. Sterile dressing applied.  The patient was placed into a soft cervical collar and transferred to  recovery  in stable condition.      MC/MEDQ  D:  01/06/2005  T:  01/07/2005  Job:   [id number]

## 2011-02-26 NOTE — H&P (Signed)
[name], Beth Bailey NO.:  [id number]   MEDICAL RECORD NO.:  [id number]          PATIENT TYPE:  OIB   LOCATION:                               FACILITY:  [facility]   PHYSICIAN:  Beth Bailey, M.D.        DATE OF BIRTH:  05/01/1946   DATE OF ADMISSION:  01/06/2005  DATE OF DISCHARGE:                                HISTORY & PHYSICAL   This history and physical was performed in our office on December 23, 2004.   CHIEF COMPLAINT:  Pain in my neck and left arm.   HISTORY OF PRESENT ILLNESS:  This is a 65 year old female seen by [place] for  continuing and progressive problems concerning severe neck pain and pain in  the left upper extremity. She also has numbness into her right hand as well.  She had previous carpal tunnel surgery to the left hand which was  unsuccessful. We tried her on a course of physical therapy which actually  increased her discomfort. MRI has shown degenerative changes at C4-5, C5-6,  and C6-7. Some very advanced degenerative changes are seen at C5-6 and C6-7.  As this patient is quite miserable with her current state of affairs and she  has positive findings on MRI as well as examination with her sensory  changes, it was felt she would benefit from surgical intervention and  discussing the risks and benefits of surgery it was decided to go ahead with  cervical fusion at two levels.   The patient's physician is Dr. Lucretia Bailey of the [facility]. Dr. Milinda Bailey has  cleared this patient for surgery.   ALLERGIES:  PENICILLIN.   CURRENT MEDICATIONS:  1.  Zyrtec allergy daily.  2.  Lodine 400 mg daily (will stop prior to surgery).  3.  One hormone pill.  4.  Lisinopril for her blood pressure.  5.  Protonix 40 mg one daily.   She has a history of pneumonia some 20 years ago.  Currently treated as  mentioned for hypertension. She has mild reflux as well.   PAST SURGICAL HISTORY:  Carpal tunnel release on the left and hysterectomy.   FAMILY HISTORY:  Positive  for diabetes, cancer, and arthritis.   SOCIAL HISTORY:  The patient is married. 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 She has no intake of alcohol or tobacco  products. She has one child and the caregiver after surgery will be her  husband.   REVIEW OF SYSTEMS:  CNS: No seizure disorder, paralysis, or double vision.  The patient does have radiculopathy and radiculitis in the upper extremities  as mentioned above. RESPIRATORY:  No shortness of breath, productive cough,  or hemoptysis. CARDIOVASCULAR: No chest pain, angina, or orthopnea.  GASTROINTESTINAL: No nausea, vomiting, melena, or bloody stool.  GENITOURINARY: No discharge, dysuria, or hematuria. MUSCULOSKELETAL:  Primarily in present illness.   PHYSICAL EXAMINATION:  VITAL SIGNS: Pulse 78, respirations 12, blood  pressure 122/78.  GENERAL: Alert, cooperative, and friendly 65 year old white female looking  somewhat younger than her stated age.  HEENT:  Normocephalic. PERRLA. Oropharynx is clear.  EOMs are intact.  CHEST: Clear to auscultation. No rales or rhonchi.  HEART: Regular rate and rhythm.  No murmurs are heard.  ABDOMEN: Soft, nontender. Liver and spleen not felt.  RECTAL/PELVIC/BREASTS/GENITALIA: Not done; not pertinent to the present  illness.  EXTREMITIES: Upper extremities as in present illness above and crepitus and  pain with range of motion specifically on rotation and extension.   ADMISSION DIAGNOSES:  1.  Cervical spondylitic radiculopathy.  2.  Hypertension.  3.  Reflux.   PLAN:  The patient will undergo anterior cervical diskectomy and fusion of  C5-6, C6-7 with allograft and plate.  Should she have any medical problems,  specifically with an blood pressure problems, will certainly call Dr. Roxy Bailey or one of her associates.      DLU/MEDQ  D:  12/24/2004  T:  12/24/2004  Job:  [id number]   cc:   Beth Bailey, M.D. [facility]

## 2011-07-07 ENCOUNTER — Other Ambulatory Visit: Payer: Self-pay | Admitting: Family Medicine

## 2011-07-07 DIAGNOSIS — Z1231 Encounter for screening mammogram for malignant neoplasm of breast: Secondary | ICD-10-CM

## 2011-07-23 LAB — CBC
Platelets: 192
RDW: 14.1 — ABNORMAL HIGH

## 2011-07-23 LAB — DIFFERENTIAL
Basophils Relative: 1
Eosinophils Absolute: 0.2
Monocytes Absolute: 0.4
Monocytes Relative: 8
Neutro Abs: 2.4

## 2011-07-23 LAB — COMPREHENSIVE METABOLIC PANEL
ALT: 33
Albumin: 4.2
Alkaline Phosphatase: 58
Potassium: 3.8
Sodium: 142
Total Protein: 7.1

## 2011-07-23 LAB — APTT: aPTT: 26

## 2011-07-26 LAB — CBC
HCT: 42.2
Hemoglobin: 14.2
RDW: 12.6

## 2011-07-26 LAB — DIFFERENTIAL
Basophils Absolute: 0
Eosinophils Relative: 1
Lymphocytes Relative: 25
Lymphs Abs: 1.8
Monocytes Absolute: 0.7
Monocytes Relative: 10

## 2011-08-02 ENCOUNTER — Ambulatory Visit
Admission: RE | Admit: 2011-08-02 | Discharge: 2011-08-02 | Disposition: A | Discharge status: Home / Self Care | Payer: Medicare HMO | Source: Ambulatory Visit | Attending: Family Medicine | Admitting: Family Medicine

## 2011-08-02 DIAGNOSIS — Z1231 Encounter for screening mammogram for malignant neoplasm of breast: Secondary | ICD-10-CM

## 2011-10-12 DIAGNOSIS — D7389 Other diseases of spleen: Secondary | ICD-10-CM

## 2011-10-12 HISTORY — DX: Other diseases of spleen: D73.89

## 2011-10-12 HISTORY — PX: CATARACT EXTRACTION: SUR2

## 2012-05-14 ENCOUNTER — Ambulatory Visit (INDEPENDENT_AMBULATORY_CARE_PROVIDER_SITE_OTHER): Payer: Medicare Other | Admitting: Family Medicine

## 2012-05-14 VITALS — BP 185/90 | HR 79 | Temp 98.1°F | Resp 16 | Ht 59.0 in | Wt 134.0 lb

## 2012-05-14 DIAGNOSIS — S61409A Unspecified open wound of unspecified hand, initial encounter: Secondary | ICD-10-CM

## 2012-05-14 DIAGNOSIS — W5501XA Bitten by cat, initial encounter: Secondary | ICD-10-CM

## 2012-05-14 DIAGNOSIS — S61459A Open bite of unspecified hand, initial encounter: Secondary | ICD-10-CM

## 2012-05-14 MED ORDER — CLINDAMYCIN HCL 150 MG PO CAPS: 150.0000 mg | ORAL_CAPSULE | Freq: Three times a day (TID) | ORAL | Status: AC

## 2012-05-14 NOTE — Progress Notes (Signed)
66 yo woman who was bitten by stray cat on Friday afternoon about 48 hours ago.  Her left hand has subsequently swelled, become red and hot, and is slightly painful to move.  No fever.  Left-handed.  Objective:  Red, swollen dorsal hand with 1 mm puncture site over 4th metacarpal  Assessment:  Cat scratch infection  Plan: clindamycin 1. Cat bite of hand  clindamycin (CLEOCIN) 150 MG capsule   Animal control report

## 2012-05-14 NOTE — Patient Instructions (Addendum)
Animal Bite  An animal bite can result in a scratch on the skin, deep open cut, puncture of the skin, crush injury, or tearing away of the skin or a body part. Dogs are responsible for most animal bites. Children are bitten more often than adults. An animal bite can range from very mild to more serious. A small bite from your house pet is no cause for alarm. However, some animal bites can become infected or injure a bone or other tissue. You must seek medical care if:  · The skin is broken and bleeding does not slow down or stop after 15 minutes.  · The puncture is deep and difficult to clean (such as a cat bite).  · Pain, warmth, redness, or pus develops around the wound.  · The bite is from a stray animal or rodent. There may be a risk of rabies infection.  · The bite is from a snake, raccoon, skunk, fox, coyote, or bat. There may be a risk of rabies infection.  · The person bitten has a chronic illness such as diabetes, liver disease, or cancer, or the person takes medicine that lowers the immune system.  · There is concern about the location and severity of the bite.  It is important to clean and protect an animal bite wound right away to prevent infection. Follow these steps:  · Clean the wound with plenty of water and soap.  · Apply an antibiotic cream.  · Apply gentle pressure over the wound with a clean towel or gauze to slow or stop bleeding.  · Elevate the affected area above the heart to help stop any bleeding.  · Seek medical care. Getting medical care within 8 hours of the animal bite leads to the best possible outcome.  DIAGNOSIS   Your caregiver will most likely:  · Take a detailed history of the animal and the bite injury.  · Perform a wound exam.  · Take your medical history.  Blood tests or X-rays may be performed. Sometimes, infected bite wounds are cultured and sent to a lab to identify the infectious bacteria.   TREATMENT   Medical treatment will depend on the location and type of animal bite as  well as the patient's medical history. Treatment may include:  · Wound care, such as cleaning and flushing the wound with saline solution, bandaging, and elevating the affected area.  · Antibiotics.  · Tetanus immunization.  · Rabies immunization.  · Leaving the wound open to heal. This is often done with animal bites, due to the high risk of infection. However, in certain cases, wound closure with stitches, wound adhesive, skin adhesive strips, or staples may be used.   Infected bites that are left untreated may require intravenous (IV) antibiotics and surgical treatment in the hospital.  HOME CARE INSTRUCTIONS  · Follow your caregiver's instructions for wound care.  · Take all medicines as directed.  · If your caregiver prescribes antibiotics, take them as directed. Finish them even if you start to feel better.  · Follow up with your caregiver for further exams or immunizations as directed.  You may need a tetanus shot if:  · You cannot remember when you had your last tetanus shot.  · You have never had a tetanus shot.  · The injury broke your skin.  If you get a tetanus shot, your arm may swell, get red, and feel warm to the touch. This is common and not a problem. If you need a tetanus   shot and you choose not to have one, there is a rare chance of getting tetanus. Sickness from tetanus can be serious.  SEEK MEDICAL CARE IF:  · You notice warmth, redness, soreness, swelling, pus discharge, or a bad smell coming from the wound.  · You have a red line on the skin coming from the wound.  · You have a fever, chills, or a general ill feeling.  · You have nausea or vomiting.  · You have continued or worsening pain.  · You have trouble moving the injured part.  · You have other questions or concerns.  MAKE SURE YOU:  · Understand these instructions.  · Will watch your condition.  · Will get help right away if you are not doing well or get worse.  Document Released: 06/15/2011 Document Revised: 09/16/2011 Document  Reviewed: 06/15/2011  ExitCare® Patient Information ©2012 [employer].

## 2012-06-01 ENCOUNTER — Ambulatory Visit (INDEPENDENT_AMBULATORY_CARE_PROVIDER_SITE_OTHER): Payer: Medicare Other | Admitting: Family Medicine

## 2012-06-01 VITALS — BP 128/80 | HR 67 | Temp 98.5°F | Resp 16 | Ht 59.0 in | Wt 133.0 lb

## 2012-06-01 DIAGNOSIS — M79609 Pain in unspecified limb: Secondary | ICD-10-CM

## 2012-06-01 DIAGNOSIS — IMO0002 Reserved for concepts with insufficient information to code with codable children: Secondary | ICD-10-CM

## 2012-06-01 NOTE — Progress Notes (Signed)
[facility]:  Office Visit  Chief Complaint:  Chief Complaint  Patient presents with  . Animal Bite    recheck cat bite on L hand    HPI: Beth Bailey is a 66 y.o. female who complains of  Left nail pain. Went to salon and got nail clipped and nail technician might have clipped too much. Was able to express some pus out at home, now feels better but wanted to make sure. Denies Diabetes.   Past Medical History  Diagnosis Date  . GERD (gastroesophageal reflux disease)   . Allergy    No past surgical history on file. History   Social History  . Marital Status: Married    Spouse Name: N/A    Number of Children: N/A  . Years of Education: N/A   Social History Main Topics  . Smoking status: Never Smoker   . Smokeless tobacco: None  . Alcohol Use: Yes  . Drug Use: No  . Sexually Active: None   Other Topics Concern  . None   Social History Narrative  . None   No family history on file. Allergies  Allergen Reactions  . Amoxicillin   . Atorvastatin     REACTION: myalgia  . Diclofenac Sodium     REACTION: swelling  . Erythromycin     REACTION: swelling  . Lisinopril     REACTION: high K level  . Penicillins     REACTION: hives  . Simvastatin     REACTION: muscle pain   Prior to Admission medications   Medication Sig Start Date End Date Taking? Authorizing Provider  cetirizine (ZYRTEC) 10 MG tablet Take 10 mg by mouth daily.   Yes Historical Provider, MD  Flaxseed, Linseed, (FLAX SEED OIL) 1000 MG CAPS Take by mouth.   Yes Historical Provider, MD  omeprazole (PRILOSEC) 20 MG capsule Take 20 mg by mouth daily.   Yes Historical Provider, MD  Prenatal Vit-Fe Fumarate-FA (PRENATAL MULTIVITAMIN) TABS Take 1 tablet by mouth daily.   Yes Historical Provider, MD  valACYclovir (VALTREX) 500 MG tablet Take 500 mg by mouth 2 (two) times daily.   Yes Historical Provider, MD     ROS: The patient denies fevers, chills, night sweats, unintentional  weight loss, chest pain, palpitations, wheezing, dyspnea on exertion, nausea, vomiting, abdominal pain, dysuria, hematuria, melena, numbness, weakness, or tingling.   All other systems have been reviewed and were otherwise negative with the exception of those mentioned in the HPI and as above.    PHYSICAL EXAM: Filed Vitals:   06/01/12 1206  BP: 128/80  Pulse: 67  Temp: 98.5 F (36.9 C)  Resp: 16   Filed Vitals:   06/01/12 1206  Height: 4\' 11"  (1.499 m)  Weight: 133 lb (60.328 kg)   Body mass index is 26.86 kg/(m^2).  General: Alert, no acute distress HEENT:  Normocephalic, atraumatic, oropharynx patent.  Cardiovascular:  Regular rate and rhythm, no rubs murmurs or gallops.  No Carotid bruits, radial pulse intact. No pedal edema.  Respiratory: Clear to auscultation bilaterally.  No wheezes, rales, or rhonchi.  No cyanosis, no use of accessory musculature GI: No organomegaly, abdomen is soft and non-tender, positive bowel sounds.  No masses. Skin: No rashes. Neurologic: Facial musculature symmetric. Psychiatric: Patient is appropriate throughout our interaction. Lymphatic: No cervical lymphadenopathy Musculoskeletal: Gait intact. Left finger: mild tenderness left lateral thumb edge ROM intact, sensation intact, no erythema. + radial pulse.  No pus/drainage  LABS: Results for orders placed during the hospital encounter of 09/08/10  POCT HEMOGLOBIN-HEMACUE      Component Value Range   Hemoglobin 15.7 (*) 12.0 - 15.0 g/dL     EKG/XRAY:   Primary read interpreted by Dr. Conley Rolls at [facility]   ASSESSMENT/PLAN: Encounter Diagnoses  Name Primary?  . Paronychia Yes  . Pain around nail    Left thumb nail  Medial edge mild irritation s/p going to nail salon. Improving No abx needed currently, improving  paronychia Continue with warm soaks at home 50:50 water and antibacterial soap TID-QID IF signs sxs of [place] or worsening paint hen  return for abx/draiange    Keita Demarco PHUONG, DO 06/01/2012 12:33 PM

## 2012-06-27 ENCOUNTER — Other Ambulatory Visit: Payer: Self-pay | Admitting: Family Medicine

## 2012-06-27 ENCOUNTER — Other Ambulatory Visit: Payer: Self-pay | Admitting: *Deleted

## 2012-06-27 DIAGNOSIS — Z1231 Encounter for screening mammogram for malignant neoplasm of breast: Secondary | ICD-10-CM

## 2012-08-02 ENCOUNTER — Ambulatory Visit
Admission: RE | Admit: 2012-08-02 | Discharge: 2012-08-02 | Disposition: A | Discharge status: Home / Self Care | Payer: Self-pay | Source: Ambulatory Visit | Attending: Family Medicine | Admitting: Family Medicine

## 2012-08-02 DIAGNOSIS — Z1231 Encounter for screening mammogram for malignant neoplasm of breast: Secondary | ICD-10-CM

## 2012-10-27 ENCOUNTER — Other Ambulatory Visit: Payer: Self-pay | Admitting: Family Medicine

## 2012-10-27 ENCOUNTER — Ambulatory Visit
Admission: RE | Admit: 2012-10-27 | Discharge: 2012-10-27 | Disposition: A | Discharge status: Home / Self Care | Payer: Medicare Other | Source: Ambulatory Visit | Attending: Family Medicine | Admitting: Family Medicine

## 2012-10-27 DIAGNOSIS — M79669 Pain in unspecified lower leg: Secondary | ICD-10-CM

## 2012-11-06 ENCOUNTER — Other Ambulatory Visit: Payer: Self-pay | Admitting: Family Medicine

## 2012-11-06 DIAGNOSIS — M79606 Pain in leg, unspecified: Secondary | ICD-10-CM

## 2012-11-06 DIAGNOSIS — M5410 Radiculopathy, site unspecified: Secondary | ICD-10-CM

## 2012-11-09 ENCOUNTER — Ambulatory Visit
Admission: RE | Admit: 2012-11-09 | Discharge: 2012-11-09 | Disposition: A | Discharge status: Home / Self Care | Payer: Medicare Other | Source: Ambulatory Visit | Attending: Family Medicine | Admitting: Family Medicine

## 2012-11-09 DIAGNOSIS — M79606 Pain in leg, unspecified: Secondary | ICD-10-CM

## 2012-11-09 DIAGNOSIS — M5410 Radiculopathy, site unspecified: Secondary | ICD-10-CM

## 2012-11-14 ENCOUNTER — Ambulatory Visit: Payer: Self-pay | Admitting: Family Medicine

## 2012-12-05 ENCOUNTER — Other Ambulatory Visit: Payer: Self-pay | Admitting: Neurosurgery

## 2012-12-05 ENCOUNTER — Encounter (HOSPITAL_COMMUNITY): Payer: Self-pay | Admitting: Pharmacy Technician

## 2012-12-06 ENCOUNTER — Encounter (HOSPITAL_COMMUNITY): Payer: Self-pay | Admitting: *Deleted

## 2012-12-06 MED ORDER — MUPIROCIN 2 % EX OINT
TOPICAL_OINTMENT | Freq: Once | CUTANEOUS | Status: DC
Start: 2012-12-06 — End: 2012-12-07
  Filled 2012-12-06: qty 22

## 2012-12-07 ENCOUNTER — Ambulatory Visit (HOSPITAL_COMMUNITY): Payer: Medicare Other | Admitting: Certified Registered"

## 2012-12-07 ENCOUNTER — Encounter (HOSPITAL_COMMUNITY): Payer: Self-pay | Admitting: Certified Registered"

## 2012-12-07 ENCOUNTER — Ambulatory Visit (HOSPITAL_COMMUNITY)
Admission: RE | Admit: 2012-12-07 | Discharge: 2012-12-08 | Disposition: A | Discharge status: Home / Self Care | Payer: Medicare Other | Source: Ambulatory Visit | Attending: Neurosurgery | Admitting: Neurosurgery

## 2012-12-07 ENCOUNTER — Encounter (HOSPITAL_COMMUNITY): Payer: Self-pay | Admitting: *Deleted

## 2012-12-07 ENCOUNTER — Encounter (HOSPITAL_COMMUNITY)
Admission: RE | Disposition: A | Discharge status: Home / Self Care | Payer: Self-pay | Source: Ambulatory Visit | Attending: Neurosurgery

## 2012-12-07 ENCOUNTER — Ambulatory Visit (HOSPITAL_COMMUNITY): Payer: Medicare Other

## 2012-12-07 DIAGNOSIS — I10 Essential (primary) hypertension: Secondary | ICD-10-CM | POA: Insufficient documentation

## 2012-12-07 DIAGNOSIS — M5126 Other intervertebral disc displacement, lumbar region: Secondary | ICD-10-CM | POA: Diagnosis present

## 2012-12-07 HISTORY — DX: Other intervertebral disc displacement, lumbar region: M51.26

## 2012-12-07 HISTORY — DX: Unspecified osteoarthritis, unspecified site: M19.90

## 2012-12-07 HISTORY — DX: Herpesviral ocular disease, unspecified: B00.50

## 2012-12-07 HISTORY — PX: LUMBAR LAMINECTOMY/DECOMPRESSION MICRODISCECTOMY: SHX5026

## 2012-12-07 LAB — SURGICAL PCR SCREEN
MRSA, PCR: NEGATIVE
Staphylococcus aureus: POSITIVE — AB

## 2012-12-07 LAB — CBC
HCT: 39.8 % (ref 36.0–46.0)
Hemoglobin: 14.2 g/dL (ref 12.0–15.0)
MCH: 32.3 pg (ref 26.0–34.0)
MCHC: 35.7 g/dL (ref 30.0–36.0)
RBC: 4.39 MIL/uL (ref 3.87–5.11)

## 2012-12-07 SURGERY — LUMBAR LAMINECTOMY/DECOMPRESSION MICRODISCECTOMY 1 LEVEL
Anesthesia: General | Site: Back | Laterality: Right | Wound class: Clean

## 2012-12-07 MED ORDER — POTASSIUM CHLORIDE IN NACL 20-0.9 MEQ/L-% IV SOLN
INTRAVENOUS | Status: DC
  Filled 2012-12-07 (×3): qty 1000

## 2012-12-07 MED ORDER — ZOLPIDEM TARTRATE 5 MG PO TABS: 5.0000 mg | ORAL_TABLET | Freq: Every evening | ORAL | Status: DC | PRN

## 2012-12-07 MED ORDER — NEOSTIGMINE METHYLSULFATE 1 MG/ML IJ SOLN
INTRAMUSCULAR | Status: DC | PRN
  Administered 2012-12-07: 3 mg via INTRAVENOUS

## 2012-12-07 MED ORDER — 0.9 % SODIUM CHLORIDE (POUR BTL) OPTIME
TOPICAL | Status: DC | PRN
  Administered 2012-12-07: 1000 mL

## 2012-12-07 MED ORDER — LACTATED RINGERS IV SOLN
INTRAVENOUS | Status: DC | PRN
  Administered 2012-12-07: 15:00:00 via INTRAVENOUS

## 2012-12-07 MED ORDER — SODIUM CHLORIDE 0.9 % IJ SOLN
3.0000 mL | Freq: Two times a day (BID) | INTRAMUSCULAR | Status: DC
  Administered 2012-12-07 – 2012-12-08 (×2): 3 mL via INTRAVENOUS

## 2012-12-07 MED ORDER — ROCURONIUM BROMIDE 100 MG/10ML IV SOLN
INTRAVENOUS | Status: DC | PRN
  Administered 2012-12-07: 50 mg via INTRAVENOUS

## 2012-12-07 MED ORDER — HYDROMORPHONE HCL PF 1 MG/ML IJ SOLN
INTRAMUSCULAR | Status: AC
  Filled 2012-12-07: qty 1

## 2012-12-07 MED ORDER — VALACYCLOVIR HCL 500 MG PO TABS
1000.0000 mg | ORAL_TABLET | Freq: Every day | ORAL | Status: DC
Start: 2012-12-08 — End: 2012-12-08
  Administered 2012-12-08: 1000 mg via ORAL
  Filled 2012-12-07: qty 2

## 2012-12-07 MED ORDER — LIDOCAINE HCL (CARDIAC) 20 MG/ML IV SOLN
INTRAVENOUS | Status: DC | PRN
  Administered 2012-12-07: 80 mg via INTRAVENOUS

## 2012-12-07 MED ORDER — HYPROMELLOSE (GONIOSCOPIC) 2.5 % OP SOLN
1.0000 [drp] | Freq: Three times a day (TID) | OPHTHALMIC | Status: DC | PRN
  Filled 2012-12-07: qty 15

## 2012-12-07 MED ORDER — LORATADINE 10 MG PO TABS
10.0000 mg | ORAL_TABLET | Freq: Every day | ORAL | Status: DC
  Administered 2012-12-08: 10 mg via ORAL
  Filled 2012-12-07: qty 1

## 2012-12-07 MED ORDER — THROMBIN 5000 UNITS EX SOLR
CUTANEOUS | Status: DC | PRN
  Administered 2012-12-07 (×2): 5000 [IU] via TOPICAL

## 2012-12-07 MED ORDER — PROPOFOL 10 MG/ML IV BOLUS
INTRAVENOUS | Status: DC | PRN
  Administered 2012-12-07: 130 mg via INTRAVENOUS

## 2012-12-07 MED ORDER — HEMOSTATIC AGENTS (NO CHARGE) OPTIME
TOPICAL | Status: DC | PRN
  Administered 2012-12-07: 1 via TOPICAL

## 2012-12-07 MED ORDER — LIDOCAINE-EPINEPHRINE 0.5 %-1:200000 IJ SOLN
INTRAMUSCULAR | Status: DC | PRN
  Administered 2012-12-07: 10 mL

## 2012-12-07 MED ORDER — OXYCODONE HCL 5 MG PO TABS
ORAL_TABLET | ORAL | Status: AC
  Filled 2012-12-07: qty 1

## 2012-12-07 MED ORDER — PANTOPRAZOLE SODIUM 40 MG PO TBEC
40.0000 mg | DELAYED_RELEASE_TABLET | Freq: Every day | ORAL | Status: DC
  Administered 2012-12-08: 40 mg via ORAL

## 2012-12-07 MED ORDER — SODIUM CHLORIDE 0.9 % IV SOLN
10.0000 mg | INTRAVENOUS | Status: DC | PRN
  Administered 2012-12-07: 40 ug/min via INTRAVENOUS

## 2012-12-07 MED ORDER — ARTIFICIAL TEARS OP OINT
TOPICAL_OINTMENT | OPHTHALMIC | Status: DC | PRN
  Administered 2012-12-07: 1 via OPHTHALMIC

## 2012-12-07 MED ORDER — MUPIROCIN 2 % EX OINT
TOPICAL_OINTMENT | Freq: Two times a day (BID) | CUTANEOUS | Status: DC
  Administered 2012-12-07 – 2012-12-08 (×2): via NASAL

## 2012-12-07 MED ORDER — MEPERIDINE HCL 25 MG/ML IJ SOLN: 6.2500 mg | INTRAMUSCULAR | Status: DC | PRN

## 2012-12-07 MED ORDER — HYDROMORPHONE HCL PF 1 MG/ML IJ SOLN
0.2500 mg | INTRAMUSCULAR | Status: DC | PRN
  Administered 2012-12-07 (×4): 0.5 mg via INTRAVENOUS

## 2012-12-07 MED ORDER — ACETAMINOPHEN 10 MG/ML IV SOLN
1000.0000 mg | Freq: Four times a day (QID) | INTRAVENOUS | Status: DC
  Administered 2012-12-07 – 2012-12-08 (×3): 1000 mg via INTRAVENOUS
  Filled 2012-12-07 (×4): qty 100

## 2012-12-07 MED ORDER — SENNA 8.6 MG PO TABS
1.0000 | ORAL_TABLET | Freq: Two times a day (BID) | ORAL | Status: DC
  Administered 2012-12-08: 8.6 mg via ORAL
  Filled 2012-12-07 (×3): qty 1

## 2012-12-07 MED ORDER — VANCOMYCIN HCL IN DEXTROSE 1-5 GM/200ML-% IV SOLN
INTRAVENOUS | Status: AC
  Administered 2012-12-07: 1000 mg via INTRAVENOUS
  Filled 2012-12-07: qty 200

## 2012-12-07 MED ORDER — OXYCODONE HCL 5 MG PO TABS
5.0000 mg | ORAL_TABLET | Freq: Once | ORAL | Status: AC | PRN
  Administered 2012-12-07: 5 mg via ORAL

## 2012-12-07 MED ORDER — FENTANYL CITRATE 0.05 MG/ML IJ SOLN
INTRAMUSCULAR | Status: DC | PRN
  Administered 2012-12-07 (×2): 100 ug via INTRAVENOUS
  Administered 2012-12-07: 50 ug via INTRAVENOUS

## 2012-12-07 MED ORDER — PREDNISOLONE ACETATE 1 % OP SUSP
1.0000 [drp] | Freq: Every day | OPHTHALMIC | Status: DC
  Administered 2012-12-07: 1 [drp] via OPHTHALMIC
  Filled 2012-12-07: qty 1

## 2012-12-07 MED ORDER — ONDANSETRON HCL 4 MG/2ML IJ SOLN: 4.0000 mg | INTRAMUSCULAR | Status: DC | PRN

## 2012-12-07 MED ORDER — MORPHINE SULFATE 2 MG/ML IJ SOLN
1.0000 mg | INTRAMUSCULAR | Status: DC | PRN
  Administered 2012-12-07: 2 mg via INTRAVENOUS
  Filled 2012-12-07: qty 1

## 2012-12-07 MED ORDER — OXYCODONE HCL 5 MG/5ML PO SOLN: 5.0000 mg | Freq: Once | ORAL | Status: AC | PRN

## 2012-12-07 MED ORDER — MIDAZOLAM HCL 5 MG/5ML IJ SOLN
INTRAMUSCULAR | Status: DC | PRN
  Administered 2012-12-07: 2 mg via INTRAVENOUS

## 2012-12-07 MED ORDER — PHENYLEPHRINE HCL 10 MG/ML IJ SOLN
INTRAMUSCULAR | Status: DC | PRN
  Administered 2012-12-07: 120 ug via INTRAVENOUS
  Administered 2012-12-07 (×2): 80 ug via INTRAVENOUS
  Administered 2012-12-07: 120 ug via INTRAVENOUS

## 2012-12-07 MED ORDER — BUPIVACAINE LIPOSOME 1.3 % IJ SUSP
20.0000 mL | INTRAMUSCULAR | Status: DC
  Filled 2012-12-07: qty 20

## 2012-12-07 MED ORDER — PRENATAL MULTIVITAMIN CH
1.0000 | ORAL_TABLET | Freq: Every day | ORAL | Status: DC
  Administered 2012-12-08: 1 via ORAL
  Filled 2012-12-07: qty 1

## 2012-12-07 MED ORDER — VALACYCLOVIR HCL 500 MG PO TABS: 1000.0000 mg | ORAL_TABLET | Freq: Every day | ORAL | Status: DC

## 2012-12-07 MED ORDER — PHENOL 1.4 % MT LIQD: 1.0000 | OROMUCOSAL | Status: DC | PRN

## 2012-12-07 MED ORDER — DIAZEPAM 5 MG PO TABS: 5.0000 mg | ORAL_TABLET | Freq: Four times a day (QID) | ORAL | Status: DC | PRN

## 2012-12-07 MED ORDER — OXYCODONE HCL 5 MG PO TABS
5.0000 mg | ORAL_TABLET | ORAL | Status: DC | PRN
  Administered 2012-12-08: 10 mg via ORAL
  Filled 2012-12-07: qty 2

## 2012-12-07 MED ORDER — GLYCOPYRROLATE 0.2 MG/ML IJ SOLN
INTRAMUSCULAR | Status: DC | PRN
  Administered 2012-12-07: 0.2 mg via INTRAVENOUS
  Administered 2012-12-07: 0.4 mg via INTRAVENOUS

## 2012-12-07 MED ORDER — ONDANSETRON HCL 4 MG/2ML IJ SOLN
INTRAMUSCULAR | Status: DC | PRN
  Administered 2012-12-07: 4 mg via INTRAVENOUS

## 2012-12-07 MED ORDER — PROMETHAZINE HCL 25 MG/ML IJ SOLN: 6.2500 mg | INTRAMUSCULAR | Status: DC | PRN

## 2012-12-07 MED ORDER — BUPIVACAINE LIPOSOME 1.3 % IJ SUSP
INTRAMUSCULAR | Status: DC | PRN
  Administered 2012-12-07: 20 mL

## 2012-12-07 MED ORDER — MENTHOL 3 MG MT LOZG: 1.0000 | LOZENGE | OROMUCOSAL | Status: DC | PRN

## 2012-12-07 MED ORDER — SODIUM CHLORIDE 0.9 % IJ SOLN: 3.0000 mL | INTRAMUSCULAR | Status: DC | PRN

## 2012-12-07 SURGICAL SUPPLY — 50 items
BAG DECANTER FOR FLEXI CONT (MISCELLANEOUS) ×2 IMPLANT
BENZOIN TINCTURE PRP APPL 2/3 (GAUZE/BANDAGES/DRESSINGS) IMPLANT
BLADE SURG ROTATE 9660 (MISCELLANEOUS) IMPLANT
BUR MATCHSTICK NEURO 3.0 LAGG (BURR) ×2 IMPLANT
CANISTER SUCTION 2500CC (MISCELLANEOUS) ×2 IMPLANT
CLOTH BEACON ORANGE TIMEOUT ST (SAFETY) ×2 IMPLANT
CONT SPEC 4OZ CLIKSEAL STRL BL (MISCELLANEOUS) ×2 IMPLANT
DECANTER SPIKE VIAL GLASS SM (MISCELLANEOUS) ×2 IMPLANT
DERMABOND ADVANCED (GAUZE/BANDAGES/DRESSINGS) ×1
DERMABOND ADVANCED .7 DNX12 (GAUZE/BANDAGES/DRESSINGS) ×1 IMPLANT
DRAPE LAPAROTOMY 100X72X124 (DRAPES) ×2 IMPLANT
DRAPE MICROSCOPE LEICA (MISCELLANEOUS) ×2 IMPLANT
DRAPE POUCH INSTRU U-SHP 10X18 (DRAPES) ×2 IMPLANT
DRAPE SURG 17X23 STRL (DRAPES) ×2 IMPLANT
DURAPREP 26ML APPLICATOR (WOUND CARE) ×2 IMPLANT
ELECT REM PT RETURN 9FT ADLT (ELECTROSURGICAL) ×2
ELECTRODE REM PT RTRN 9FT ADLT (ELECTROSURGICAL) ×1 IMPLANT
GAUZE SPONGE 4X4 16PLY XRAY LF (GAUZE/BANDAGES/DRESSINGS) IMPLANT
GLOVE ECLIPSE 6.5 STRL STRAW (GLOVE) ×2 IMPLANT
GLOVE ECLIPSE 7.5 STRL STRAW (GLOVE) ×6 IMPLANT
GLOVE EXAM NITRILE LRG STRL (GLOVE) IMPLANT
GLOVE EXAM NITRILE MD LF STRL (GLOVE) IMPLANT
GLOVE EXAM NITRILE XL STR (GLOVE) IMPLANT
GLOVE EXAM NITRILE XS STR PU (GLOVE) IMPLANT
GLOVE INDICATOR 7.5 STRL GRN (GLOVE) ×4 IMPLANT
GOWN BRE IMP SLV AUR LG STRL (GOWN DISPOSABLE) ×4 IMPLANT
GOWN BRE IMP SLV AUR XL STRL (GOWN DISPOSABLE) ×2 IMPLANT
GOWN STRL REIN 2XL LVL4 (GOWN DISPOSABLE) IMPLANT
KIT BASIN OR (CUSTOM PROCEDURE TRAY) ×2 IMPLANT
KIT ROOM TURNOVER OR (KITS) ×2 IMPLANT
NEEDLE HYPO 21X1.5 SAFETY (NEEDLE) ×2 IMPLANT
NEEDLE HYPO 25X1 1.5 SAFETY (NEEDLE) ×2 IMPLANT
NEEDLE SPNL 18GX3.5 QUINCKE PK (NEEDLE) ×2 IMPLANT
NS IRRIG 1000ML POUR BTL (IV SOLUTION) ×2 IMPLANT
PACK LAMINECTOMY NEURO (CUSTOM PROCEDURE TRAY) ×2 IMPLANT
PAD ARMBOARD 7.5X6 YLW CONV (MISCELLANEOUS) ×6 IMPLANT
RUBBERBAND STERILE (MISCELLANEOUS) ×4 IMPLANT
SPONGE GAUZE 4X4 12PLY (GAUZE/BANDAGES/DRESSINGS) IMPLANT
SPONGE LAP 4X18 X RAY DECT (DISPOSABLE) IMPLANT
SPONGE SURGIFOAM ABS GEL SZ50 (HEMOSTASIS) ×2 IMPLANT
STRIP CLOSURE SKIN 1/2X4 (GAUZE/BANDAGES/DRESSINGS) IMPLANT
SUT VIC AB 0 CT1 18XCR BRD8 (SUTURE) ×1 IMPLANT
SUT VIC AB 0 CT1 8-18 (SUTURE) ×1
SUT VIC AB 2-0 CT1 18 (SUTURE) ×2 IMPLANT
SUT VIC AB 3-0 SH 8-18 (SUTURE) ×2 IMPLANT
SYR 20CC LL (SYRINGE) ×2 IMPLANT
SYR 20ML ECCENTRIC (SYRINGE) ×2 IMPLANT
TOWEL OR 17X24 6PK STRL BLUE (TOWEL DISPOSABLE) ×2 IMPLANT
TOWEL OR 17X26 10 PK STRL BLUE (TOWEL DISPOSABLE) ×2 IMPLANT
WATER STERILE IRR 1000ML POUR (IV SOLUTION) ×2 IMPLANT

## 2012-12-07 NOTE — Preoperative (Signed)
Beta Blockers   Reason not to administer Beta Blockers:Not Applicable 

## 2012-12-07 NOTE — Anesthesia Postprocedure Evaluation (Signed)
  Anesthesia Post-op Note  Patient: Beth Bailey  Procedure(s) Performed: Procedure(s) with comments: LUMBAR LAMINECTOMY/DECOMPRESSION MICRODISCECTOMY 1 LEVEL (Right) - Right lumbar five-sacral one diskectomy  Patient Location: PACU  Anesthesia Type:General  Level of Consciousness: awake  Airway and Oxygen Therapy: Patient Spontanous Breathing  Post-op Pain: mild  Post-op Assessment: Post-op Vital signs reviewed  Post-op Vital Signs: stable  Complications: No apparent anesthesia complications

## 2012-12-07 NOTE — Anesthesia Preprocedure Evaluation (Addendum)
Anesthesia Evaluation  Patient identified by MRN, date of birth, ID band Patient awake    Reviewed: Allergy & Precautions, H&P , NPO status , Patient's Chart, lab work & pertinent test results  History of Anesthesia Complications Negative for: history of anesthetic complications  Airway Mallampati: I  Neck ROM: Full    Dental  (+) Edentulous Upper and Dental Advisory Given   Pulmonary neg pulmonary ROS,  breath sounds clear to auscultation        Cardiovascular hypertension, Rhythm:Regular Rate:Normal     Neuro/Psych  Headaches,    GI/Hepatic Neg liver ROS, GERD-  ,  Endo/Other    Renal/GU negative Renal ROS     Musculoskeletal negative musculoskeletal ROS (+)   Abdominal   Peds  Hematology negative hematology ROS (+)   Anesthesia Other Findings   Reproductive/Obstetrics                          Anesthesia Physical Anesthesia Plan  ASA: II  Anesthesia Plan: General   Post-op Pain Management:    Induction: Intravenous  Airway Management Planned: Oral ETT  Additional Equipment:   Intra-op Plan:   Post-operative Plan: Extubation in OR  Informed Consent: I have reviewed the patients History and Physical, chart, labs and discussed the procedure including the risks, benefits and alternatives for the proposed anesthesia with the patient or authorized representative who has indicated his/her understanding and acceptance.   Dental advisory given  Plan Discussed with: CRNA and Surgeon  Anesthesia Plan Comments:         Anesthesia Quick Evaluation

## 2012-12-07 NOTE — Transfer of Care (Signed)
Immediate Anesthesia Transfer of Care Note  Patient: Beth Bailey  Procedure(s) Performed: Procedure(s) with comments: LUMBAR LAMINECTOMY/DECOMPRESSION MICRODISCECTOMY 1 LEVEL (Right) - Right lumbar five-sacral one diskectomy  Patient Location: PACU  Anesthesia Type:General  Level of Consciousness: patient cooperative, lethargic and responds to stimulation  Airway & Oxygen Therapy: Patient Spontanous Breathing and Patient connected to nasal cannula oxygen  Post-op Assessment: Report given to PACU RN and Patient moving all extremities X 4  Post vital signs: Reviewed and stable  Complications: No apparent anesthesia complications

## 2012-12-07 NOTE — H&P (Signed)
BP 173/94  Pulse 82  Temp(Src) 99.3 F (37.4 C) (Oral)  Resp 20  Ht 4\' 11"  (1.499 m)  Wt 57.153 kg (126 lb)  BMI 25.44 kg/m2  SpO2 98%  HISTORY:     Beth Bailey is a 67 year old retired woman who presents today for evaluation of pain that she has in her back and right lower extremity.  She has had this pain now for approximately a month.  It occurred just before her daughter was to be married.  She has had a Prednisone taper which took the edge off but did not relieve her of the pain. She subsequently underwent an MRI of the lumbar spine which revealed a fairly large disc herniation at L5-S1.  She has been taking Tramadol which does help but does not help a lot.  She has also been taking Flexeril which again has helped to some slight degree.  She says that the pain is very severe in the right calf.  She felt a knot in the calf in the past.  She has had no bowel or bladder dysfunction.    PAST MEDICAL HISTORY:  Gastroesophageal reflux disease, herpes, osteoarthritis, adenomatous colon polyps, cystocele, anal fissure, and hypercholesterolemia.  SOCIAL HISTORY:     She does not smoke.  She does not use alcohol.  She does not use any illicit drugs.  She weighs 128 pounds and 146 cm. in height.  FAMILY HISTORY:    Mother is 4 in good health.  Father died at the age of 77.  Colon cancer is present in the family history.    PAST SURGICAL HISTORY:  She has undergone a hysterectomy.  She has had neck surgery, carpal tunnel release bilaterally, and cholecystectomy.  ALLERGIES:    SHE HAS AN ALLERGY TO ERYTHRO-MYCIN, VIROPTIC AND PENICILLIN.    REVIEW OF SYSTEMS:   Positive for cataracts, hypercholesterolemia, leg pain at rest, and with walking, arthritis and joint pain, and memory problems. She denies constitutional, respiration, GI, GU, skin, psychiatric, endocrine, hematologic and allergic problems.    MEDICATIONS:    She takes Valacyclovir q.d., Zyrtec 20 mg. q.d., Omeprazole 20 mg. q.d., Flax  seed oil, Prednisone eye drops, multivitamins and fish oil.    PHYSICAL EXAMINATION:  On examination, she is alert and oriented x four and answering all questions appropriately.  Memory, language, attention span, fund of knowledge are normal.  She is well kempt and in moderate distress. She can toe walk and heel walk.  Straight leg raising is positive at about 60 degrees.  Reflexes are intact at the biceps, triceps, brachioradialis, knees and ankles.  PERRL.  Full EOM's.  Full visual fields.  Hearing is intact to finger rub bilaterally.  Uvula elevates in the midline.  Shoulder shrug is normal.  Tongue protrudes in the midline.  She has 5/5 strength in both upper and lower extremities.  Intact proprioception in both upper and lower extremities.  Gait is antalgic favoring the right lower extremity.  Muscle tone, bulk and coordination are otherwise normal.   DIAGNOSTIC STUDIES:   MRI of the lumbar spine shows significant stenosis at L4-5, left side worse  than right due to facet arthropathy.  Fluid in the joints bilaterally at 4-5.  She has an anterolisthesis Grade 1 of 4 on 5.  She also has a large disc herniation at L5-S1 eccentric to the right side compromising the right S1 nerve root.  The conus is normal.  Cauda equina is normal.  The disc at 3-4 is  also degenerated.  DIAGNOSES:      1. Displaced disc right L5-S1. 2. Right S1 radiculopathy. 3. Anterolisthesis L4-5.  SUMMARY:     I do believe she would be best served by undergoing operative decompression.  This would provide immediate relief.  She could get injections but I do not think that is going to be of much use in this situation.  We went over the pros and cons of surgery.  Risks including bleeding, infection, no relief, disc recurrence, damage to the nerve roots causing weakness or bowel or bladder dysfunction.  I also gave her a detailed instruction sheet.  She understands and wishes to proceed.  She wishes to proceed with operative  decompression.

## 2012-12-07 NOTE — Op Note (Signed)
12/07/2012  5:19 PM  PATIENT:  Beth Bailey  67 y.o. female  With severe pain in the right lower extremity. MRI shows a large disc herniation at L5/S1 on the right side which is certainly the   PRE-OPERATIVE DIAGNOSIS:  lumbar herniated disc lumbar radiculopathy  POST-OPERATIVE DIAGNOSIS:  lumbar herniated disc lumbar radiculopathy  PROCEDURE:  Procedure(s): LUMBAR right L5/s1 semihemilaminectomy with discetomy Microdissection   SURGEON:  Surgeon(s): Carmela Hurt, MD Clydene Fake, MD  ASSISTANTS:Hirsch, Fayrene Fearing  ANESTHESIA:   general  EBL:  Total I/O In: 800 [I.V.:800] Out: -   BLOOD ADMINISTERED:none  CELL SAVER GIVEN:none  COUNT:per nursing  DRAINS: none   SPECIMEN:  No Specimen  DICTATION: [name] was taken to the operating room, intubated and placed under a general anesthetic without difficulty. She was positioned prone on a Wilson frame with all pressure points padded. Her back was prepped and draped in a sterile manner. I infiltrated the lumbar region with 10cc lidocaine. I opened the skin with a 10 blade and took this down to the thoracolumbar fascia. I exposed the lamina of L5 and S1 on the right side. I confirmed my location with an intraoperative xray. I used both Kerrison punches and a drill to perform a semihemilaminectomy of L5 and S1 on the right side. I made a semicircular incision in the thoraclumbar fascia basing it medially and retracted this with a vicryl suture. I used the cobb curettes to then expose the lamina in a subperiosteal fashion of L5 and S1.   I brought the microscope into the operative field to allow for microdissection. Dr. Phoebe Perch and I with microdissection retracted the thecal sac medially and exposed the disc space. There was a large lump of material rostral to the disc space lodged underneath the S1 and L5 roots. I opened the disc space first and removed degenerated disc material. We were then able to use a microhook to dissect the  free fragment of disc and endplate and remove that with pituitary rongeurs. I went back into the disc space and removed more disc. We did this until we felt there were no more loose pieces of disc, nor did we appreciate compression of the nerve roots. We irrigated then closed the wound. I did infiltrate 10cc exparel into the right paraspinous musculature before closure. We approximated my fascial flap with vicryls, then the subcuatenous and subcuticular layers with vicryl sutures. I used dermabond for a sterile dressing.   PLAN OF CARE: Admit for overnight observation  PATIENT DISPOSITION:  PACU - hemodynamically stable.   Delay start of Pharmacological VTE agent (>24hrs) due to surgical blood loss or risk of bleeding:  yes

## 2012-12-07 NOTE — Anesthesia Procedure Notes (Signed)
Procedure Name: Intubation Date/Time: 12/07/2012 3:33 PM Performed by: Jefm Miles E Pre-anesthesia Checklist: Patient identified, Timeout performed, Emergency Drugs available, Suction available and Patient being monitored Patient Re-evaluated:Patient Re-evaluated prior to inductionOxygen Delivery Method: Circle system utilized Preoxygenation: Pre-oxygenation with 100% oxygen Intubation Type: IV induction Ventilation: Mask ventilation without difficulty Laryngoscope Size: Mac and 4 Grade View: Grade I Tube type: Oral Tube size: 7.0 mm Number of attempts: 1 Airway Equipment and Method: Stylet Placement Confirmation: ETT inserted through vocal cords under direct vision,  positive ETCO2 and breath sounds checked- equal and bilateral Secured at: 20 cm Tube secured with: Tape Dental Injury: Teeth and Oropharynx as per pre-operative assessment

## 2012-12-08 MED ORDER — HYDROCODONE-ACETAMINOPHEN 5-325 MG PO TABS: 1.0000 | ORAL_TABLET | Freq: Four times a day (QID) | ORAL | Status: DC | PRN

## 2012-12-08 MED ORDER — CYCLOBENZAPRINE HCL 10 MG PO TABS: 10.0000 mg | ORAL_TABLET | Freq: Three times a day (TID) | ORAL | Status: DC | PRN

## 2012-12-08 NOTE — Discharge Summary (Signed)
Physician Discharge Summary  Patient ID: Beth Bailey MRN: [id number] DOB/AGE: 1945-11-20 67 y.o.  Admit date: 12/07/2012 Discharge date: 12/08/2012  Admission Diagnoses:Lumbar HNP right L5/S1, right S1 radiculopathy  Discharge Diagnoses:  Principal Problem:   HNP (herniated nucleus pulposus), lumbar   Discharged Condition: good  Hospital Course: Beth Bailey was taken to the operating room where she underwent a simple laminectomy and discetomy for a very large disc herniation and free fragment underlying the S1 root. Post op she has had greatly improved pain. She has voided, ambulated, and tolerated a regular diet. Wound is clean, dry, and without signs of infection.   Consults: None  Significant Diagnostic Studies: none  Treatments: surgery: right L5/S1 semihemilaminectomy and discetomy  Discharge Exam: Blood pressure 137/79, pulse 69, temperature 98.6 F (37 C), temperature source Oral, resp. rate 18, height 4\' 11"  (1.499 m), weight 57.153 kg (126 lb), SpO2 95.00%. General appearance: alert, cooperative and appears stated age Neurologic: Alert and oriented X 3, normal strength and tone. Normal symmetric reflexes. Normal coordination and gait  Disposition: 01-Home or Self Care   Future Appointments Provider Department Dept Phone   01/04/2013 2:15 PM Eustaquio Boyden, MD [facility] [phone]       Medication List    TAKE these medications       acetaminophen 500 MG tablet  Commonly known as:  TYLENOL  Take 500-1,000 mg by mouth every 6 (six) hours as needed for pain.     cetirizine 10 MG tablet  Commonly known as:  ZYRTEC  Take 10 mg by mouth daily.     cyclobenzaprine 10 MG tablet  Commonly known as:  FLEXERIL  Take 1 tablet (10 mg total) by mouth 3 (three) times daily as needed for muscle spasms.     Flax Seed Oil 1000 MG Caps  Take 1 capsule by mouth daily.     HYDROcodone-acetaminophen 5-325 MG per tablet  Commonly known as:   NORCO/VICODIN  Take 1 tablet by mouth every 6 (six) hours as needed for pain.     hydroxypropyl methylcellulose 2.5 % ophthalmic solution  Commonly known as:  ISOPTO TEARS  Place 1 drop into both eyes 3 (three) times daily as needed (for dry eyes).     omeprazole 20 MG capsule  Commonly known as:  PRILOSEC  Take 20 mg by mouth daily.     oxyCODONE-acetaminophen 5-325 MG per tablet  Commonly known as:  PERCOCET/ROXICET  Take 1 tablet by mouth every 4 (four) hours as needed for pain.     prednisoLONE acetate 1 % ophthalmic suspension  Commonly known as:  PRED FORTE  Place 1 drop into the right eye at bedtime.     prenatal multivitamin Tabs  Take 1 tablet by mouth daily.     valACYclovir 1000 MG tablet  Commonly known as:  VALTREX  Take 1,000 mg by mouth daily.           Follow-up Information   Follow up with Aniceto Kyser L, MD In 4 weeks. (call to make appointment)    Contact information:   [address]                         [address] [place] [address] [phone]       Signed: Winifred Bodiford L 12/08/2012, 3:14 PM

## 2012-12-09 LAB — HM DIABETES EYE EXAM

## 2012-12-10 ENCOUNTER — Encounter (HOSPITAL_COMMUNITY): Payer: Self-pay | Admitting: Neurosurgery

## 2013-01-04 ENCOUNTER — Ambulatory Visit (INDEPENDENT_AMBULATORY_CARE_PROVIDER_SITE_OTHER): Payer: Medicare Other | Admitting: Family Medicine

## 2013-01-04 ENCOUNTER — Ambulatory Visit: Payer: Self-pay | Admitting: Family Medicine

## 2013-01-04 ENCOUNTER — Encounter: Payer: Self-pay | Admitting: Family Medicine

## 2013-01-04 VITALS — BP 142/78 | HR 88 | Temp 98.1°F | Ht 58.25 in | Wt 128.8 lb

## 2013-01-04 DIAGNOSIS — E119 Type 2 diabetes mellitus without complications: Secondary | ICD-10-CM

## 2013-01-04 DIAGNOSIS — M5126 Other intervertebral disc displacement, lumbar region: Secondary | ICD-10-CM

## 2013-01-04 DIAGNOSIS — E7849 Other hyperlipidemia: Secondary | ICD-10-CM | POA: Insufficient documentation

## 2013-01-04 DIAGNOSIS — B0050 Herpesviral ocular disease, unspecified: Secondary | ICD-10-CM | POA: Insufficient documentation

## 2013-01-04 DIAGNOSIS — K219 Gastro-esophageal reflux disease without esophagitis: Secondary | ICD-10-CM

## 2013-01-04 DIAGNOSIS — R7309 Other abnormal glucose: Secondary | ICD-10-CM

## 2013-01-04 DIAGNOSIS — I10 Essential (primary) hypertension: Secondary | ICD-10-CM

## 2013-01-04 DIAGNOSIS — Z86010 Personal history of colonic polyps: Secondary | ICD-10-CM

## 2013-01-04 DIAGNOSIS — R194 Change in bowel habit: Secondary | ICD-10-CM | POA: Insufficient documentation

## 2013-01-04 DIAGNOSIS — E785 Hyperlipidemia, unspecified: Secondary | ICD-10-CM

## 2013-01-04 DIAGNOSIS — R739 Hyperglycemia, unspecified: Secondary | ICD-10-CM | POA: Insufficient documentation

## 2013-01-04 DIAGNOSIS — R198 Other specified symptoms and signs involving the digestive system and abdomen: Secondary | ICD-10-CM

## 2013-01-04 NOTE — Assessment & Plan Note (Signed)
ppi controlled.

## 2013-01-04 NOTE — Progress Notes (Signed)
Subjective:    Patient ID: Beth Bailey, female    DOB: 1946/04/18, 67 y.o.   MRN: [id number]  HPI CC: new pt to re establish  Prior saw Dr. Milinda Antis then Dr. Wynelle Link and Cliffton Asters at [facility] (with ileocecal carcinoma). Takes care of husband with memory loss 2/2 stroke 2009.   Herniated disc on right s/p surgery last year (Dr. Mikal Plane).  Recently released from neurosurgery care.  Continues to use hydrocodone and flexeril intermittently.  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  Takes valtrex, prednisolone drops and isopto tears regularly.  Sees ophtho regularly.  Having some GI issues over last month, started after hydrocodone was started for recent back surgery.Marland Kitchen  Has BM urge after meals.  Some abd discomfort with this.  Good fiber intake.  Could improve water intake. Appetite down, attributes to narcotic.  Wt Readings from Last 3 Encounters:  01/04/13 128 lb 12 oz (58.401 kg)  12/07/12 126 lb (57.153 kg)  12/07/12 126 lb (57.153 kg)   Lives with husband, birds.  Grown dogs. Occupation: retired, worked at [employer] Activity:  Diet: fruits/vegetables daily, some water  Preventative: Last CPE 2013 Mammogram/pap with prior PCP 2013 Last colonoscopy 10/20/2010 - h/o polyps, rec rpt 5 yrs Leone Payor)  Medications and allergies reviewed and updated in chart.  Past histories reviewed and updated if relevant as below. Patient Active Problem List  Diagnosis  . DIABETES MELLITUS, TYPE II  . HYPERCHOLESTEROLEMIA  . HYPERTENSION  . URI  . GERD  . CONTACT DERMATITIS&OTHER ECZEMA DUE UNSPEC CAUSE  . OSTEOARTHRITIS  . HEADACHE  . DYSURIA  . PELVIC  PAIN  . HNP (herniated nucleus pulposus), lumbar   Past Medical History  Diagnosis Date  . GERD (gastroesophageal reflux disease)     controlled with PPI  . Allergic rhinitis     prior on shot  . Arthritis   . 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 HNP  (herniated nucleus pulposus), lumbar 12/07/12    s/p surgery  . History of colon polyps   . HLD (hyperlipidemia)    Past Surgical History  Procedure Laterality Date  . Abdominal hysterectomy  1985  . Corneal transplant Right 2012  . Cataract extraction Right 2013    Cataract  . Carpal tunnel release Bilateral   . Cholecystectomy  2007  . Lumbar laminectomy/decompression microdiscectomy Right 12/07/2012    Procedure: LUMBAR LAMINECTOMY/DECOMPRESSION MICRODISCECTOMY 1 LEVEL;  Surgeon: Carmela Hurt, MD; Laterality: Right;  Right lumbar five-sacral one diskectomy   History  Substance Use Topics  . Smoking status: Never Smoker   . Smokeless tobacco: Never Used  . Alcohol Use: No   Family History  Problem Relation Age of Onset  . Arthritis    . Diabetes Maternal Grandmother   . Dementia Maternal Grandmother   . Parkinson's disease Maternal Grandfather   . Cancer Sister     colon (ileocecal valve) Darel Hong)  . Cancer Maternal Uncle     ?  Marland Kitchen Cancer Paternal Uncle     lungs  . CAD Maternal Uncle     MI  . Stroke Maternal Grandmother    Allergies  Allergen Reactions  . Amoxicillin   . Atorvastatin     REACTION: myalgia  . Diclofenac Sodium     REACTION: swelling  . Erythromycin     REACTION: swelling  . Lisinopril     REACTION:  high K level  . Penicillins     REACTION: hives  . Simvastatin     REACTION: muscle pain   Current Outpatient Prescriptions on File Prior to Visit  Medication Sig Dispense Refill  . acetaminophen (TYLENOL) 500 MG tablet Take 500-1,000 mg by mouth every 6 (six) hours as needed for pain.      . cetirizine (ZYRTEC) 10 MG tablet Take 10 mg by mouth daily.      . cyclobenzaprine (FLEXERIL) 10 MG tablet Take 1 tablet (10 mg total) by mouth 3 (three) times daily as needed for muscle spasms.  30 tablet  0  . Flaxseed, Linseed, (FLAX SEED OIL) 1000 MG CAPS Take 1 capsule by mouth daily.       Marland Kitchen HYDROcodone-acetaminophen (NORCO/VICODIN) 5-325 MG per tablet  Take 1 tablet by mouth every 6 (six) hours as needed for pain.  70 tablet  0  . hydroxypropyl methylcellulose (ISOPTO TEARS) 2.5 % ophthalmic solution Place 1 drop into both eyes 3 (three) times daily as needed (for dry eyes).      Marland Kitchen omeprazole (PRILOSEC) 20 MG capsule Take 20 mg by mouth daily.      . prednisoLONE acetate (PRED FORTE) 1 % ophthalmic suspension Place 1 drop into the right eye at bedtime.      . valACYclovir (VALTREX) 1000 MG tablet Take 1,000 mg by mouth daily.       No current facility-administered medications on file prior to visit.     Review of Systems  Constitutional: Positive for unexpected weight change (mild weight loss). Negative for fever, chills, activity change, appetite change and fatigue.  HENT: Negative for hearing loss and neck pain.   Eyes: Negative for visual disturbance.  Respiratory: Negative for cough, chest tightness, shortness of breath and wheezing.   Cardiovascular: Negative for chest pain, palpitations and leg swelling.  Gastrointestinal: Positive for abdominal pain, diarrhea and constipation. Negative for nausea, vomiting, blood in stool and abdominal distention.  Genitourinary: Negative for hematuria and difficulty urinating.  Musculoskeletal: Negative for myalgias and arthralgias.  Skin: Negative for rash.  Neurological: Negative for dizziness, seizures, syncope and headaches.  Hematological: Negative for adenopathy. Does not bruise/bleed easily.  Psychiatric/Behavioral: Negative for dysphoric mood. The patient is not nervous/anxious.        Objective:   Physical Exam  Nursing note and vitals reviewed. Constitutional: She is oriented to person, place, and time. She appears well-developed and well-nourished. No distress.  HENT:  Head: Normocephalic and atraumatic.  Right Ear: Hearing, tympanic membrane, external ear and ear canal normal.  Left Ear: Hearing, tympanic membrane, external ear and ear canal normal.  Nose: Nose normal.   Mouth/Throat: Oropharynx is clear and moist. No oropharyngeal exudate.  Eyes: Conjunctivae and EOM are normal. Pupils are equal, round, and reactive to light. No scleral icterus.  Neck: Normal range of motion. Neck supple. Carotid bruit is not present.  Cardiovascular: Normal rate, regular rhythm, normal heart sounds and intact distal pulses.   No murmur heard. Pulses:      Radial pulses are 2+ on the right side, and 2+ on the left side.  Pulmonary/Chest: Effort normal and breath sounds normal. No respiratory distress. She has no wheezes. She has no rales.  Abdominal: Soft. Bowel sounds are normal. She exhibits no distension and no mass. There is no tenderness. There is no rebound and no guarding.  Musculoskeletal: Normal range of motion. She exhibits no edema.  Lymphadenopathy:    She has no cervical adenopathy.  Neurological: She is alert and oriented to person, place, and time.  CN grossly intact, station and gait intact  Skin: Skin is warm and dry. No rash noted.  Psychiatric: She has a normal mood and affect. Her behavior is normal. Judgment and thought content normal.       Assessment & Plan:

## 2013-01-04 NOTE — Assessment & Plan Note (Signed)
Very mild today, will continue to monitor.

## 2013-01-04 NOTE — Assessment & Plan Note (Signed)
H/o this in chart but pt denies.  Will check next blood draw.  Lab Results  Component Value Date   HGBA1C 6.4 07/08/2009

## 2013-01-04 NOTE — Patient Instructions (Addendum)
Good to see you today, call us with questions. Return as needed or in 6 months for checkup. For bowels - increase fiber in diet and increase water.  Start daily soluble fiber supplement like metamucil or benefiber. Schedule appointment for whenever next physical is due , come in prior fasting for blood work

## 2013-01-04 NOTE — Assessment & Plan Note (Signed)
S/p surgery Beth Bailey).  Overall doing well from this standpoint.

## 2013-01-04 NOTE — Assessment & Plan Note (Addendum)
Recently (since started narcotic after spine surgery) Anticipate component of this. I did suggest she increase fiber in diet as well as soluble fiber intake in form of dissolved metamucil or benefiber. Reassess next visit. No red flags, colonoscopy up to date.

## 2013-06-05 ENCOUNTER — Other Ambulatory Visit: Payer: Self-pay | Admitting: Dermatology

## 2013-06-05 DIAGNOSIS — Z85820 Personal history of malignant melanoma of skin: Secondary | ICD-10-CM

## 2013-06-05 HISTORY — DX: Personal history of malignant melanoma of skin: Z85.820

## 2013-06-25 ENCOUNTER — Other Ambulatory Visit: Payer: Self-pay

## 2013-06-25 DIAGNOSIS — Z1231 Encounter for screening mammogram for malignant neoplasm of breast: Secondary | ICD-10-CM

## 2013-07-12 ENCOUNTER — Other Ambulatory Visit: Payer: Self-pay | Admitting: Dermatology

## 2013-07-19 ENCOUNTER — Encounter: Payer: Self-pay | Admitting: Internal Medicine

## 2013-08-06 ENCOUNTER — Ambulatory Visit
Admission: RE | Admit: 2013-08-06 | Discharge: 2013-08-06 | Disposition: A | Discharge status: Home / Self Care | Payer: Medicare Other | Source: Ambulatory Visit

## 2013-08-06 DIAGNOSIS — Z1231 Encounter for screening mammogram for malignant neoplasm of breast: Secondary | ICD-10-CM

## 2013-08-09 ENCOUNTER — Encounter: Payer: Self-pay | Admitting: *Deleted

## 2013-08-09 LAB — HM MAMMOGRAPHY: HM Mammogram: NEGATIVE

## 2013-08-22 ENCOUNTER — Ambulatory Visit (INDEPENDENT_AMBULATORY_CARE_PROVIDER_SITE_OTHER): Payer: Medicare Other | Admitting: Family Medicine

## 2013-08-22 ENCOUNTER — Telehealth: Payer: Self-pay

## 2013-08-22 ENCOUNTER — Encounter: Payer: Self-pay | Admitting: Family Medicine

## 2013-08-22 ENCOUNTER — Other Ambulatory Visit: Payer: Self-pay | Admitting: Family Medicine

## 2013-08-22 VITALS — BP 148/96 | HR 84 | Temp 98.4°F | Wt 134.2 lb

## 2013-08-22 DIAGNOSIS — E119 Type 2 diabetes mellitus without complications: Secondary | ICD-10-CM

## 2013-08-22 DIAGNOSIS — R202 Paresthesia of skin: Secondary | ICD-10-CM

## 2013-08-22 DIAGNOSIS — I10 Essential (primary) hypertension: Secondary | ICD-10-CM

## 2013-08-22 DIAGNOSIS — S239XXA Sprain of unspecified parts of thorax, initial encounter: Secondary | ICD-10-CM

## 2013-08-22 DIAGNOSIS — E785 Hyperlipidemia, unspecified: Secondary | ICD-10-CM

## 2013-08-22 DIAGNOSIS — S29012A Strain of muscle and tendon of back wall of thorax, initial encounter: Secondary | ICD-10-CM

## 2013-08-22 DIAGNOSIS — R209 Unspecified disturbances of skin sensation: Secondary | ICD-10-CM

## 2013-08-22 LAB — TSH: TSH: 1.92 u[IU]/mL (ref 0.35–5.50)

## 2013-08-22 LAB — BASIC METABOLIC PANEL
BUN: 15 mg/dL (ref 6–23)
CO2: 29 mEq/L (ref 19–32)
Chloride: 102 mEq/L (ref 96–112)
GFR: 87.08 mL/min (ref >60.00)
Glucose, Bld: 115 mg/dL — ABNORMAL HIGH (ref 70–99)
Potassium: 5.2 mEq/L — ABNORMAL HIGH (ref 3.5–5.1)
Sodium: 140 mEq/L (ref 135–145)

## 2013-08-22 LAB — LDL CHOLESTEROL, DIRECT: Direct LDL: 223.9 mg/dL

## 2013-08-22 MED ORDER — PRAVASTATIN SODIUM 80 MG PO TABS: 80.0000 mg | ORAL_TABLET | Freq: Every day | ORAL | Status: DC

## 2013-08-22 MED ORDER — CYCLOBENZAPRINE HCL 10 MG PO TABS: 10.0000 mg | ORAL_TABLET | Freq: Three times a day (TID) | ORAL | Status: DC | PRN

## 2013-08-22 MED ORDER — COENZYME Q10 30 MG PO CAPS: 30.0000 mg | ORAL_CAPSULE | Freq: Every day | ORAL | Status: DC

## 2013-08-22 NOTE — Assessment & Plan Note (Signed)
Off meds - continue to monitor.

## 2013-08-22 NOTE — Assessment & Plan Note (Signed)
Check FLP today. 

## 2013-08-22 NOTE — Assessment & Plan Note (Signed)
Reassuring exam - monitor for now.  Treat with flexeril prn.  Pt agrees with plan.

## 2013-08-22 NOTE — Assessment & Plan Note (Signed)
?  prediabetes from last readings - check A1c today, change diagnosis accordingly.

## 2013-08-22 NOTE — Telephone Encounter (Signed)
Pt request status of flexeril rx that was prescribed this morning. Spoke with [facility] [address] and rx ready for pickup. Pt voiced understanding.

## 2013-08-22 NOTE — Progress Notes (Signed)
  Subjective:    Patient ID: Beth Bailey, female    DOB: 03/05/1946, 67 y.o.   MRN: [id number]  HPI CC: discuss L back pain  Yesterday morning when she awoke, felt cramping at left posterior ribcage.  Lasted a few minutes.  May have slept awkwardly.  No radiation of pain.  No neck pain.  No chest or abdominal pain with this, no nausea, fevers.  No dyspnea or cough.  Longstanding h/o foot paresthesias R>L.  Even prior to lumbar surgery.  H/o elevated bp in past - doesn't check at home but has cuff at home. BP Readings from Last 3 Encounters:  08/22/13 148/96  01/04/13 142/78  12/08/12 137/79   Requests blood work today.  Cares for husband with alzheimer's.  Cares for mother and sister.  Declines flu shot.  Past Medical History  Diagnosis Date  . GERD (gastroesophageal reflux disease)     controlled with PPI  . Allergic rhinitis     prior on allergy shots  . Arthritis   . 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 HNP (herniated nucleus pulposus), lumbar 12/07/12    s/p surgery  . History of colon polyps   . HLD (hyperlipidemia)     mild  . Hyperglycemia     Past Surgical History  Procedure Laterality Date  . Abdominal hysterectomy  1985  . Corneal transplant Right 2012  . Cataract extraction Right 2013    Cataract  . Carpal tunnel release Bilateral   . Cholecystectomy  2007  . Lumbar laminectomy/decompression microdiscectomy Right 12/07/2012    Procedure: LUMBAR LAMINECTOMY/DECOMPRESSION MICRODISCECTOMY 1 LEVEL;  Surgeon: Carmela Hurt, MD; Laterality: Right;  Right lumbar five-sacral one diskectomy  . Neck surgery  1990s    plate in neck  . Colonoscopy  10/2010    polyp , mild divertic, int hem, rec rpt 5 yrs Leone Payor)   Review of Systems Per HPI    Objective:   Physical Exam  Nursing note and vitals reviewed. Constitutional: She appears well-developed and well-nourished. No distress.  HENT:  Mouth/Throat: Oropharynx is clear and  moist. No oropharyngeal exudate.  Cardiovascular: Normal rate, regular rhythm, normal heart sounds and intact distal pulses.   No murmur heard. Pulmonary/Chest: Effort normal and breath sounds normal. No respiratory distress. She has no wheezes. She has no rales.  Musculoskeletal: She exhibits no edema.  No midline thoracic spine tenderness No paraspinous or rhomboid mm tenderness/tightness to palpation  R dorsal midfoot cyst nontender, no deformity or other tenderness of feet  Skin: Skin is warm and dry. No rash noted.       Assessment & Plan:

## 2013-08-22 NOTE — Patient Instructions (Signed)
Good to see you today, call us with questions. Blood work today.  Return in next 1-2 months for medicare wellness visit. Try flexeril at night time for sleep.

## 2013-08-22 NOTE — Progress Notes (Signed)
Pre-visit discussion using our clinic review tool. No additional management support is needed unless otherwise documented below in the visit note.  

## 2013-08-27 ENCOUNTER — Telehealth: Payer: Self-pay

## 2013-08-27 MED ORDER — METAXALONE 800 MG PO TABS: 400.0000 mg | ORAL_TABLET | Freq: Three times a day (TID) | ORAL | Status: DC | PRN

## 2013-08-27 NOTE — Telephone Encounter (Signed)
Pt left v/m that pt took cyclobenzaprine twice after seen; pt said could not have BM after taking cyclobenzaprine. Pt has been having problems with hemorrhoids on and off for years but last week had severe hemorrhoids (hemorrhoids are better). Pt has not taken any cyclobenzaprine since 08/22/13 and pt is still having leg cramps and lt shoulder cramps on and off. Pt is not going to take any more cyclobenzaprine. Pt wants to know if there is another muscle relaxant she can take. [place].

## 2013-08-27 NOTE — Telephone Encounter (Signed)
Patient notified

## 2013-08-27 NOTE — Telephone Encounter (Signed)
May try skelaxin - sent into pharmacy.

## 2013-08-28 ENCOUNTER — Encounter: Payer: Self-pay | Admitting: Internal Medicine

## 2013-08-28 ENCOUNTER — Ambulatory Visit (INDEPENDENT_AMBULATORY_CARE_PROVIDER_SITE_OTHER): Payer: Medicare Other | Admitting: Internal Medicine

## 2013-08-28 VITALS — BP 172/80 | HR 76 | Ht 58.25 in | Wt 134.4 lb

## 2013-08-28 DIAGNOSIS — K589 Irritable bowel syndrome without diarrhea: Secondary | ICD-10-CM

## 2013-08-28 DIAGNOSIS — K648 Other hemorrhoids: Secondary | ICD-10-CM

## 2013-08-28 MED ORDER — COLESTIPOL HCL 5 G PO GRAN: 5.0000 g | GRANULES | Freq: Every day | ORAL | Status: DC

## 2013-08-28 MED ORDER — COLESTIPOL HCL 1 G PO TABS: ORAL_TABLET | ORAL | Status: DC

## 2013-08-28 NOTE — Progress Notes (Signed)
  Subjective:    Patient ID: Beth Bailey, female    DOB: 09-13-1946, 67 y.o.   MRN: [id number]  HPI Patient is a very pleasant elderly white woman known from prior procedure or evaluations a history of IBS myself or my former partner. She is here because she's had intermittent diarrhea for up to a month at the time, she says ever since she had a cholecystectomy in 2007. She does not take any specific treatment for it but it is disturbing her quality of life in making it difficult to get out of the house when she has this problem. It is generally postprandially more often with spicy or fatty foods. He also complains of swelling from hemorrhoids. They protrusion prolapse. She recently took 2 Flexeril for back pain problems and a muscle strain and got constipated and her hemorrhoids flared again. She was prescribed Skelaxin but it is $130 so she has not talked taking that.  She reports hemorrhoid trouble since pregnancy, she had a 24-hour and difficult labor with her only child, her daughter Medications, allergies, past medical history, past surgical history, family history and social history are reviewed and updated in the EMR.  Review of Systems Positive for upper back pain and lower back pain. She has arthritis. All other review of systems negative or as per history of present illness.    Objective:   Physical Exam General:  NAD Eyes:   anicteric Lungs:  clear Heart:  S1S2 no rubs, murmurs or gallops Abdomen:  soft and nontender, BS+  Rectal: Female staff present Anoderm inspection revealed anal tags all positions, small - medium Anal wink was absent Digital exam revealed normal resting tone and slightly reduced voluntary squeeze. No mass  Present. Small rectocele Simulated defecation with valsalva revealed appropriate abdominal contraction and descent.  PROCEDURE NOTE: The patient presents with symptomatic grade 2  hemorrhoids, requesting rubber band ligation of his/her hemorrhoidal  disease.  All risks, benefits and alternative forms of therapy were described and informed consent was obtained.  Female staff present. In the Left Lateral Decubitus position anoscopic examination revealed grade 2 hemorrhoids in the all positions with RP most prominent  The decision was made to band the RP internal hemorrhoid, and the [facility] System was used to perform band ligation without complication.  Digital anorectal examination was then performed to assure proper positioning of the band, and to adjust the banded tissue as required.  The patient was discharged home without pain or other issues.  Dietary and behavioral recommendations were given and along with follow-up instructions.     The following adjunctive treatments were recommended:  Colestid for IBS diarrhea  The patient will return in 4-6 weeks for  follow-up and possible additional banding as required. No complications were encountered and the patient tolerated the procedure well.   Data Reviewed:  Prior endoscopies, GI notes     Assessment & Plan:  IBS (irritable bowel syndrome) - Plan: DISCONTINUED: colestipol (COLESTID) 5 G granules  Internal hemorrhoids with Grade 2 prolapse, itching, bleeding and soiling

## 2013-08-28 NOTE — Patient Instructions (Addendum)
We have sent the following medications to your pharmacy for you to pick up at your convenience: Colestid,  May use as needed when you have episodes of diarrhea.  Don't start this for a few days.  HEMORRHOID BANDING PROCEDURE    FOLLOW-UP CARE   1. The procedure you have had should have been relatively painless since the banding of the area involved does not have nerve endings and there is no pain sensation.  The rubber band cuts off the blood supply to the hemorrhoid and the band may fall off as soon as 48 hours after the banding (the band may occasionally be seen in the toilet bowl following a bowel movement). You may notice a temporary feeling of fullness in the rectum which should respond adequately to plain Tylenol or Motrin.  2. Following the banding, avoid strenuous exercise that evening and resume full activity the next day.  A sitz bath (soaking in a warm tub) or bidet is soothing, and can be useful for cleansing the area after bowel movements.     3. To avoid constipation, take two tablespoons of natural wheat bran, natural oat bran, flax, Benefiber or any over the counter fiber supplement and increase your water intake to 7-8 glasses daily.    4. Unless you have been prescribed anorectal medication, do not put anything inside your rectum for two weeks: No suppositories, enemas, fingers, etc.  5. Occasionally, you may have more bleeding than usual after the banding procedure.  This is often from the untreated hemorrhoids rather than the treated one.  Don't be concerned if there is a tablespoon or so of blood.  If there is more blood than this, lie flat with your bottom higher than your head and apply an ice pack to the area. If the bleeding does not stop within a half an hour or if you feel faint, call our office at [phone] or go to the emergency room.  6. Problems are not common; however, if there is a substantial amount of bleeding, severe pain, chills, fever or difficulty  passing urine (very rare) or other problems, you should call us at [phone] or report to the nearest emergency room.  7. Do not stay seated continuously for more than 2-3 hours for a day or two after the procedure.  Tighten your buttock muscles 10-15 times every two hours and take 10-15 deep breaths every 1-2 hours.  Do not spend more than a few minutes on the toilet if you cannot empty your bowel; instead re-visit the toilet at a later time.    We can see you again in January for another banding or sooner if you prefer.   I appreciate the opportunity to care for you.

## 2013-08-29 DIAGNOSIS — K589 Irritable bowel syndrome without diarrhea: Secondary | ICD-10-CM | POA: Insufficient documentation

## 2013-08-29 DIAGNOSIS — K648 Other hemorrhoids: Secondary | ICD-10-CM | POA: Insufficient documentation

## 2013-08-29 NOTE — Assessment & Plan Note (Signed)
RP column banded RTC 2+ weeks to reassess and consider banding more See if control of diarrhea from IBS helps

## 2013-08-29 NOTE — Assessment & Plan Note (Signed)
Try Colestid at least prn when diarrhea flaring

## 2013-09-03 ENCOUNTER — Telehealth: Payer: Self-pay | Admitting: Internal Medicine

## 2013-09-03 NOTE — Telephone Encounter (Signed)
Patient scheduled for 10/16/13 8:30

## 2013-09-25 ENCOUNTER — Encounter: Payer: Self-pay | Admitting: Family Medicine

## 2013-09-25 ENCOUNTER — Ambulatory Visit (INDEPENDENT_AMBULATORY_CARE_PROVIDER_SITE_OTHER): Payer: Medicare Other | Admitting: Family Medicine

## 2013-09-25 VITALS — BP 152/98 | HR 85 | Temp 98.3°F | Ht 58.25 in | Wt 131.2 lb

## 2013-09-25 DIAGNOSIS — E785 Hyperlipidemia, unspecified: Secondary | ICD-10-CM

## 2013-09-25 DIAGNOSIS — M899 Disorder of bone, unspecified: Secondary | ICD-10-CM

## 2013-09-25 DIAGNOSIS — M8580 Other specified disorders of bone density and structure, unspecified site: Secondary | ICD-10-CM | POA: Insufficient documentation

## 2013-09-25 DIAGNOSIS — Z0000 Encounter for general adult medical examination without abnormal findings: Secondary | ICD-10-CM

## 2013-09-25 DIAGNOSIS — I10 Essential (primary) hypertension: Secondary | ICD-10-CM

## 2013-09-25 DIAGNOSIS — E119 Type 2 diabetes mellitus without complications: Secondary | ICD-10-CM

## 2013-09-25 DIAGNOSIS — K219 Gastro-esophageal reflux disease without esophagitis: Secondary | ICD-10-CM

## 2013-09-25 MED ORDER — PRAVASTATIN SODIUM 80 MG PO TABS: 80.0000 mg | ORAL_TABLET | Freq: Every day | ORAL | Status: DC

## 2013-09-25 MED ORDER — OMEPRAZOLE 20 MG PO CPDR: 20.0000 mg | DELAYED_RELEASE_CAPSULE | Freq: Every day | ORAL | Status: DC

## 2013-09-25 NOTE — Progress Notes (Signed)
Pre-visit discussion using our clinic review tool. No additional management support is needed unless otherwise documented below in the visit note.  

## 2013-09-25 NOTE — Assessment & Plan Note (Signed)
Reviewed dx - continue to monitor closely.  Pt does not check sugars at home.

## 2013-09-25 NOTE — Assessment & Plan Note (Signed)
I have personally reviewed the Medicare Annual Wellness questionnaire and have noted 1. The patient's medical and social history 2. Their use of alcohol, tobacco or illicit drugs 3. Their current medications and supplements 4. The patient's functional ability including ADL's, fall risks, home safety risks and hearing or visual impairment. 5. Diet and physical activity 6. Evidence for depression or mood disorders The patients weight, height, BMI have been recorded in the chart.  Hearing and vision has been addressed. I have made referrals, counseling and provided education to the patient based review of the above and I have provided the pt with a written personalized care plan for preventive services. See scanned questionairre. Advanced directives discussed: pt will meet w lawyer to discuss - declines packet of info today  Reviewed preventative protocols and updated unless pt declined.  Reviewed need for continued cervical cancer screening after partial hysterectomy - pt states due to abnormality on prior pap, but this was >30 yrs ago.  Will space out to Q2 yrs, then consider discontinuing altogether.  Pt agrees. UTD colonscopy and mammogram.  Breast exam today.

## 2013-09-25 NOTE — Patient Instructions (Addendum)
Check into setting up advanced directive or living will. Try pravastatin 80mg  once daily for cholesterol as your levels were too high - see if we tolerate this medicine, if not let me know.  Try coQ10 over the counter for muscle function (hopeful to help with cramping on new cholesterol medicine). Sugars are doing well.  We will recheck potassium level today. I'd like you to return in 6 months for follow up visit on your cholesterol and sugar, sooner if needed. Good to see you today, call us with questions.

## 2013-09-25 NOTE — Assessment & Plan Note (Signed)
Continue low dose omeprazole - refilled today.

## 2013-09-25 NOTE — Assessment & Plan Note (Addendum)
Marked deterioration off statin with LDL 220s - discussed reasons to treat - recommended trial of pravastatin 80mg  along with CoQ10 supplement. To update me with how she tolerates this med.  If not tolerated, consider RYR vs zetia

## 2013-09-25 NOTE — Assessment & Plan Note (Signed)
reviewed paper chart - last bone density scan was 2002 consistent with osteopenia of L fem neck.  I will discuss rpt at next visit

## 2013-09-25 NOTE — Progress Notes (Signed)
Subjective:    Patient ID: Beth Bailey, female    DOB: 17-Dec-1945, 67 y.o.   MRN: [id number]  HPI CC: medicare wellness visit, initial  Sister of Domingo Pulse (with ileocecal carcinoma). Takes care of husband with memory loss 2/2 stroke 2009.  Takes care of sister and mother.  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 Takes valtrex, prednisolone drops and isopto tears regularly. Sees ophtho regularly.  Hearing screen passed today.  Vision screen already done this year so deferred today (12/2012) Denies falls or anhedonia/sadness/depression this year.  DM - doesn't check sugars.  H/o diet controlled diabetes.  Eye exam done 12/2012.  Saw Dr. Leone Payor, pending hemorrhoid procedure next year.  Diarrhea has slowed down.  Has not needed colestid.  Wt Readings from Last 3 Encounters:  09/25/13 131 lb 4 oz (59.535 kg)  08/28/13 134 lb 6.4 oz (60.963 kg)  08/22/13 134 lb 4 oz (60.895 kg)    Preventative:  Mammogram 07/2013 normal.  Breast exam today. Last colonoscopy 10/20/2010 - h/o polyps, rec rpt 5 yrs Leone Payor). Last well woman with prior PCP 2013, normal.  Discussed rpt this year or spacing out q2 yrs - pt decides to space out. dexa - last done 2002 - reviewed paper chart.  We will need to discuss rpt at next appt. Flu - declines pneumovax - declines.  H/o remote PNA infection. Tetanus - declines zostavax - declines Advanced directives: planning on setting up with lawyer in new year.  Thinks would want daughter to be HCPOA.  Lives with husband, birds. Grown dogs.  Occupation: retired, worked at [employer]  Activity:  Diet: fruits/vegetables daily, some water   Medications and allergies reviewed and updated in chart.  Past histories reviewed and updated if relevant as below. Patient Active Problem List   Diagnosis Date Noted  . IBS (irritable bowel syndrome) 08/29/2013  . Internal hemorrhoids with Grade 2 prolapse, itching, bleeding and  soiling 08/29/2013  . Upper back strain 08/22/2013  . HLD (hyperlipidemia)   . History of colon polyps   . Herpes simplex of eye   . HNP (herniated nucleus pulposus), lumbar 12/07/2012  . DIABETES MELLITUS, TYPE II 06/21/2008  . HYPERTENSION 02/23/2007  . GERD 02/23/2007  . OSTEOARTHRITIS 02/23/2007   Past Medical History  Diagnosis Date  . GERD (gastroesophageal reflux disease)     controlled with PPI  . Allergic rhinitis     prior on allergy shots  . Arthritis   . 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 HNP (herniated nucleus pulposus), lumbar 12/07/12    s/p surgery  . History of colon polyps   . HLD (hyperlipidemia)     mild  . Hyperglycemia   . Colon polyps    Past Surgical History  Procedure Laterality Date  . Abdominal hysterectomy  1985  . Corneal transplant Right 2012  . Cataract extraction Right 2013  . Carpal tunnel release Bilateral   . Cholecystectomy  2007  . Lumbar laminectomy/decompression microdiscectomy Right 12/07/2012    Procedure: LUMBAR LAMINECTOMY/DECOMPRESSION MICRODISCECTOMY 1 LEVEL;  Surgeon: Carmela Hurt, MD; Laterality: Right;  Right lumbar five-sacral one diskectomy  . Neck surgery  1990s    plate in neck  . Colonoscopy  10/2010    polyp , mild divertic, int hem, rec rpt 5 yrs Leone Payor)   History  Substance Use Topics  . Smoking status: Never Smoker   . Smokeless  tobacco: Never Used  . Alcohol Use: No   Family History  Problem Relation Age of Onset  . Arthritis    . Diabetes Maternal Grandmother   . Dementia Maternal Grandmother   . Parkinson's disease Maternal Grandfather   . Colon cancer Sister   . Cancer Maternal Uncle     questionable type  . Lung cancer Paternal Uncle   . Heart attack Maternal Uncle   . Stroke Maternal Grandmother   . Liver disease Neg Hx   . Kidney disease Maternal Uncle    Allergies  Allergen Reactions  . Amoxicillin   . Atorvastatin     REACTION: myalgia  . Diclofenac  Sodium     REACTION: swelling  . Erythromycin     REACTION: swelling  . Lisinopril     REACTION: high K level  . Penicillins     REACTION: hives  . Simvastatin     REACTION: muscle pain   Current Outpatient Prescriptions on File Prior to Visit  Medication Sig Dispense Refill  . acetaminophen (TYLENOL) 500 MG tablet Take 500-1,000 mg by mouth every 6 (six) hours as needed for pain.      . Biotin 5000 MCG CAPS Take 1 capsule by mouth daily.      . cetirizine (ZYRTEC) 10 MG tablet Take 10 mg by mouth daily.      Marland Kitchen co-enzyme Q-10 30 MG capsule Take 1 capsule (30 mg total) by mouth daily.      . colestipol (COLESTID) 1 G tablet Take 5 tablets with supper daily.  450 tablet  3  . HYDROcodone-acetaminophen (NORCO/VICODIN) 5-325 MG per tablet Take 1 tablet by mouth every 6 (six) hours as needed for pain.  70 tablet  0  . hydroxypropyl methylcellulose (ISOPTO TEARS) 2.5 % ophthalmic solution Place 1 drop into both eyes 3 (three) times daily as needed (for dry eyes).      . Multiple Vitamin (MULTIVITAMIN) tablet Take 1 tablet by mouth daily.      Marland Kitchen omeprazole (PRILOSEC) 20 MG capsule Take 20 mg by mouth daily.      . prednisoLONE acetate (PRED FORTE) 1 % ophthalmic suspension Place 1 drop into the right eye at bedtime.      . valACYclovir (VALTREX) 1000 MG tablet Take 1,000 mg by mouth daily.       No current facility-administered medications on file prior to visit.     Review of Systems  Constitutional: Negative for fever, chills, activity change, appetite change, fatigue and unexpected weight change.  HENT: Negative for hearing loss.   Eyes: Negative for visual disturbance.  Respiratory: Negative for cough, chest tightness, shortness of breath and wheezing.   Cardiovascular: Negative for chest pain, palpitations and leg swelling.  Gastrointestinal: Negative for nausea, vomiting, abdominal pain, diarrhea, constipation, blood in stool and abdominal distention.  Genitourinary: Negative for  hematuria and difficulty urinating.  Musculoskeletal: Negative for arthralgias, myalgias and neck pain.  Skin: Negative for rash.  Neurological: Negative for dizziness, seizures, syncope and headaches.  Hematological: Negative for adenopathy. Does not bruise/bleed easily.  Psychiatric/Behavioral: Negative for dysphoric mood. The patient is not nervous/anxious.        Objective:   Physical Exam  Nursing note and vitals reviewed. Constitutional: She is oriented to person, place, and time. She appears well-developed and well-nourished. No distress.  HENT:  Head: Normocephalic and atraumatic.  Right Ear: Hearing, tympanic membrane, external ear and ear canal normal.  Left Ear: Hearing, tympanic membrane, external ear and ear  canal normal.  Nose: Nose normal.  Mouth/Throat: Oropharynx is clear and moist. No oropharyngeal exudate.  Eyes: Conjunctivae and EOM are normal. Pupils are equal, round, and reactive to light. No scleral icterus.  Neck: Normal range of motion. Neck supple. Carotid bruit is not present. No thyromegaly present.  Cardiovascular: Normal rate, regular rhythm, normal heart sounds and intact distal pulses.   No murmur heard. Pulses:      Radial pulses are 2+ on the right side, and 2+ on the left side.  Pulmonary/Chest: Effort normal and breath sounds normal. No respiratory distress. She has no wheezes. She has no rales. Right breast exhibits no inverted nipple, no mass, no nipple discharge, no skin change and no tenderness. Left breast exhibits no inverted nipple, no mass, no nipple discharge, no skin change and no tenderness.  Abdominal: Soft. Bowel sounds are normal. She exhibits no distension and no mass. There is no tenderness. There is no rebound and no guarding.  Musculoskeletal: Normal range of motion. She exhibits no edema.  Lymphadenopathy:       Head (right side): No submandibular, no tonsillar, no preauricular and no posterior auricular adenopathy present.        Head (left side): No submandibular, no tonsillar, no preauricular and no posterior auricular adenopathy present.    She has no cervical adenopathy.    She has no axillary adenopathy.       Right axillary: No lateral adenopathy present.       Left axillary: No lateral adenopathy present.      Right: No supraclavicular adenopathy present.       Left: No supraclavicular adenopathy present.  Neurological: She is alert and oriented to person, place, and time.  CN grossly intact, station and gait intact  Skin: Skin is warm and dry. No rash noted.  Psychiatric: She has a normal mood and affect. Her behavior is normal. Judgment and thought content normal.       Assessment & Plan:

## 2013-09-25 NOTE — Assessment & Plan Note (Signed)
Off meds.  bp elevated today.  Recheck next visit. BP Readings from Last 3 Encounters:  09/25/13 152/98  08/28/13 172/80  08/22/13 148/96

## 2013-09-26 LAB — COMPREHENSIVE METABOLIC PANEL
ALT: 63 U/L — ABNORMAL HIGH (ref 0–35)
AST: 56 U/L — ABNORMAL HIGH (ref 0–37)
Albumin: 4.7 g/dL (ref 3.5–5.2)
Alkaline Phosphatase: 62 U/L (ref 39–117)
BUN: 12 mg/dL (ref 6–23)
Potassium: 4.9 mEq/L (ref 3.5–5.1)
Sodium: 142 mEq/L (ref 135–145)

## 2013-09-30 ENCOUNTER — Other Ambulatory Visit: Payer: Self-pay | Admitting: Family Medicine

## 2013-09-30 DIAGNOSIS — K760 Fatty (change of) liver, not elsewhere classified: Secondary | ICD-10-CM | POA: Insufficient documentation

## 2013-09-30 DIAGNOSIS — R7401 Elevation of levels of liver transaminase levels: Secondary | ICD-10-CM

## 2013-10-12 ENCOUNTER — Encounter: Payer: Self-pay | Admitting: Family Medicine

## 2013-10-16 ENCOUNTER — Encounter: Payer: Medicare Other | Admitting: Internal Medicine

## 2013-10-23 ENCOUNTER — Other Ambulatory Visit (INDEPENDENT_AMBULATORY_CARE_PROVIDER_SITE_OTHER): Payer: Medicare HMO

## 2013-10-23 DIAGNOSIS — R7402 Elevation of levels of lactic acid dehydrogenase (LDH): Secondary | ICD-10-CM

## 2013-10-23 DIAGNOSIS — R740 Nonspecific elevation of levels of transaminase and lactic acid dehydrogenase [LDH]: Principal | ICD-10-CM

## 2013-10-23 DIAGNOSIS — R7401 Elevation of levels of liver transaminase levels: Secondary | ICD-10-CM

## 2013-10-23 LAB — HEPATIC FUNCTION PANEL
ALT: 66 U/L — ABNORMAL HIGH (ref 0–35)
AST: 54 U/L — AB (ref 0–37)
Albumin: 4.3 g/dL (ref 3.5–5.2)
Alkaline Phosphatase: 59 U/L (ref 39–117)
BILIRUBIN TOTAL: 0.6 mg/dL (ref 0.3–1.2)
Bilirubin, Direct: 0 mg/dL (ref 0.0–0.3)
Total Protein: 7.2 g/dL (ref 6.0–8.3)

## 2013-10-23 LAB — CBC WITH DIFFERENTIAL/PLATELET
BASOS PCT: 0.5 % (ref 0.0–3.0)
Basophils Absolute: 0 10*3/uL (ref 0.0–0.1)
EOS PCT: 1.8 % (ref 0.0–5.0)
Eosinophils Absolute: 0.1 10*3/uL (ref 0.0–0.7)
HEMATOCRIT: 42.1 % (ref 36.0–46.0)
HEMOGLOBIN: 14.1 g/dL (ref 12.0–15.0)
LYMPHS ABS: 2.8 10*3/uL (ref 0.7–4.0)
Lymphocytes Relative: 35.1 % (ref 12.0–46.0)
MCHC: 33.5 g/dL (ref 30.0–36.0)
MCV: 94.1 fl (ref 78.0–100.0)
MONO ABS: 0.5 10*3/uL (ref 0.1–1.0)
MONOS PCT: 6.5 % (ref 3.0–12.0)
NEUTROS ABS: 4.5 10*3/uL (ref 1.4–7.7)
Neutrophils Relative %: 56.1 % (ref 43.0–77.0)
PLATELETS: 175 10*3/uL (ref 150.0–400.0)
RBC: 4.48 Mil/uL (ref 3.87–5.11)
RDW: 13.8 % (ref 11.5–14.6)
WBC: 8 10*3/uL (ref 4.5–10.5)

## 2013-10-24 ENCOUNTER — Other Ambulatory Visit: Payer: Self-pay | Admitting: Family Medicine

## 2013-10-24 ENCOUNTER — Encounter: Payer: Self-pay | Admitting: Family Medicine

## 2013-10-24 DIAGNOSIS — R740 Nonspecific elevation of levels of transaminase and lactic acid dehydrogenase [LDH]: Principal | ICD-10-CM

## 2013-10-24 DIAGNOSIS — R7401 Elevation of levels of liver transaminase levels: Secondary | ICD-10-CM

## 2013-11-27 ENCOUNTER — Other Ambulatory Visit: Payer: Medicare HMO

## 2013-11-28 ENCOUNTER — Other Ambulatory Visit (INDEPENDENT_AMBULATORY_CARE_PROVIDER_SITE_OTHER): Payer: Medicare HMO

## 2013-11-28 DIAGNOSIS — R7401 Elevation of levels of liver transaminase levels: Secondary | ICD-10-CM

## 2013-11-28 DIAGNOSIS — R7402 Elevation of levels of lactic acid dehydrogenase (LDH): Secondary | ICD-10-CM

## 2013-11-28 DIAGNOSIS — R740 Nonspecific elevation of levels of transaminase and lactic acid dehydrogenase [LDH]: Principal | ICD-10-CM

## 2013-11-28 LAB — HEPATIC FUNCTION PANEL
ALBUMIN: 4.2 g/dL (ref 3.5–5.2)
ALT: 56 U/L — AB (ref 0–35)
AST: 44 U/L — AB (ref 0–37)
Alkaline Phosphatase: 54 U/L (ref 39–117)
BILIRUBIN TOTAL: 0.5 mg/dL (ref 0.3–1.2)
Bilirubin, Direct: 0.1 mg/dL (ref 0.0–0.3)
TOTAL PROTEIN: 7.4 g/dL (ref 6.0–8.3)

## 2013-11-28 LAB — IBC PANEL
Iron: 97 ug/dL (ref 42–145)
Saturation Ratios: 27.9 % (ref 20.0–50.0)
Transferrin: 248.7 mg/dL (ref 212.0–360.0)

## 2013-11-28 LAB — FERRITIN: FERRITIN: 149.1 ng/mL (ref 10.0–291.0)

## 2013-11-28 LAB — TSH: TSH: 0.35 u[IU]/mL (ref 0.35–5.50)

## 2013-11-29 ENCOUNTER — Other Ambulatory Visit: Payer: Self-pay | Admitting: Family Medicine

## 2013-11-29 DIAGNOSIS — R7401 Elevation of levels of liver transaminase levels: Secondary | ICD-10-CM

## 2013-11-29 DIAGNOSIS — R740 Nonspecific elevation of levels of transaminase and lactic acid dehydrogenase [LDH]: Principal | ICD-10-CM

## 2013-11-29 LAB — HEPATITIS PANEL, ACUTE
HCV Ab: NEGATIVE
HEP A IGM: NONREACTIVE
Hep B C IgM: NONREACTIVE
Hepatitis B Surface Ag: NEGATIVE

## 2013-12-09 DIAGNOSIS — K760 Fatty (change of) liver, not elsewhere classified: Secondary | ICD-10-CM

## 2013-12-09 HISTORY — DX: Fatty (change of) liver, not elsewhere classified: K76.0

## 2013-12-12 ENCOUNTER — Ambulatory Visit
Admission: RE | Admit: 2013-12-12 | Discharge: 2013-12-12 | Disposition: A | Discharge status: Home / Self Care | Payer: Medicare HMO | Source: Ambulatory Visit | Attending: Family Medicine | Admitting: Family Medicine

## 2013-12-12 DIAGNOSIS — R740 Nonspecific elevation of levels of transaminase and lactic acid dehydrogenase [LDH]: Principal | ICD-10-CM

## 2013-12-12 DIAGNOSIS — R7401 Elevation of levels of liver transaminase levels: Secondary | ICD-10-CM

## 2013-12-13 ENCOUNTER — Encounter: Payer: Self-pay | Admitting: Family Medicine

## 2013-12-18 ENCOUNTER — Ambulatory Visit (INDEPENDENT_AMBULATORY_CARE_PROVIDER_SITE_OTHER): Payer: Medicare HMO | Admitting: Family Medicine

## 2013-12-18 ENCOUNTER — Encounter: Payer: Self-pay | Admitting: Family Medicine

## 2013-12-18 VITALS — BP 144/88 | HR 88 | Temp 98.1°F | Wt 135.8 lb

## 2013-12-18 DIAGNOSIS — Z636 Dependent relative needing care at home: Secondary | ICD-10-CM

## 2013-12-18 DIAGNOSIS — R4589 Other symptoms and signs involving emotional state: Secondary | ICD-10-CM | POA: Insufficient documentation

## 2013-12-18 DIAGNOSIS — Z6379 Other stressful life events affecting family and household: Secondary | ICD-10-CM

## 2013-12-18 DIAGNOSIS — F418 Other specified anxiety disorders: Secondary | ICD-10-CM | POA: Insufficient documentation

## 2013-12-18 MED ORDER — SERTRALINE HCL 50 MG PO TABS: 50.0000 mg | ORAL_TABLET | Freq: Every day | ORAL | Status: DC

## 2013-12-18 NOTE — Patient Instructions (Signed)
For fatty liver we want to work towards good control of your cholesterol levels.  Try red yeast rice 600mg  once daily, if tolerated go up to twice daily. Let's start sertraline 1/2 tablet daily for 1 week then increase to 1 tablet daily. Return to see me in 6-8 weeks for follow up, sooner if needed.  Fatty Liver Fatty liver is the accumulation of fat in liver cells. It is also called hepatosteatosis or steatohepatitis. It is normal for your liver to contain some fat. If fat is more than 5 to 10% of your liver's weight, you have fatty liver.  There are often no symptoms (problems) for years while damage is still occurring. People often learn about their fatty liver when they have medical tests for other reasons. Fat can damage your liver for years or even decades without causing problems. When it becomes severe, it can cause fatigue, weight loss, weakness, and confusion. This makes you more likely to develop more serious liver problems. The liver is the largest organ in the body. It does a lot of work and often gives no warning signs when it is sick until late in a disease. The liver has many important jobs including:  Breaking down foods.  Storing vitamins, iron, and other minerals.  Making proteins.  Making bile for food digestion.  Breaking down many products including medications, alcohol and some poisons. CAUSES  There are a number of different conditions, medications, and poisons that can cause a fatty liver. Eating too many calories causes fat to build up in the liver. Not processing and breaking fats down normally may also cause this. Certain conditions, such as obesity, diabetes, and high triglycerides also cause this. Most fatty liver patients tend to be middle-aged and over weight.  Some causes of fatty liver are:  Alcohol over consumption.  Malnutrition.  Steroid use.  Valproic acid toxicity.  Obesity.  Cushing's syndrome.  Poisons.  Tetracycline in high  dosages.  Pregnancy.  Diabetes.  Hyperlipidemia.  Rapid weight loss. Some people develop fatty liver even having none of these conditions. SYMPTOMS  Fatty liver most often causes no problems. This is called asymptomatic.  It can be diagnosed with blood tests and also by a liver biopsy.  It is one of the most common causes of minor elevations of liver enzymes on routine blood tests.  Specialized Imaging of the liver using ultrasound, CT (computed tomography) scan, or MRI (magnetic resonance imaging) can suggest a fatty liver but a biopsy is needed to confirm it.  A biopsy involves taking a small sample of liver tissue. This is done by using a needle. It is then looked at under a microscope by a specialist. TREATMENT  It is important to treat the cause. Simple fatty liver without a medical reason may not need treatment.  Weight loss, fat restriction, and exercise in overweight patients produces inconsistent results but is worth trying.  Fatty liver due to alcohol toxicity may not improve even with stopping drinking.  Good control of diabetes may reduce fatty liver.  Lower your triglycerides through diet, medication or both.  Eat a balanced, healthy diet.  Increase your physical activity.  Get regular checkups from a liver specialist.  There are no medical or surgical treatments for a fatty liver or NASH, but improving your diet and increasing your exercise may help prevent or reverse some of the damage. PROGNOSIS  Fatty liver may cause no damage or it can lead to an inflammation of the liver. This is, called  steatohepatitis. When it is linked to alcohol abuse, it is called alcoholic steatohepatitis. It often is not linked to alcohol. It is then called nonalcoholic steatohepatitis, or NASH. Over time the liver may become scarred and hardened. This condition is called cirrhosis. Cirrhosis is serious and may lead to liver failure or cancer. NASH is one of the leading causes of  cirrhosis. About 10-20% of Americans have fatty liver and a smaller 2-5% has NASH. Document Released: 11/12/2005 Document Revised: 12/20/2011 Document Reviewed: 01/05/2006 [facility] Patient Information 2014 [place].

## 2013-12-18 NOTE — Assessment & Plan Note (Signed)
Discussed caregiver burnout, discussed importance of respite care - pt unsure who she would ask for help. Discussed adult daycare, discussed private home health aide. Will start sertraline 25-50mg  daily.  RTC 6-8 wks for f/u. A total of 25 minutes were spent face-to-face with the patient during this encounter and over half of that time was spent on counseling and coordination of care

## 2013-12-18 NOTE — Progress Notes (Signed)
BP 144/88  Pulse 88  Temp(Src) 98.1 F (36.7 C) (Oral)  Wt 135 lb 12.8 oz (61.598 kg)   CC: discuss fatigue  Subjective:    Patient ID: Beth Bailey, female    DOB: 12-22-1945, 68 y.o.   MRN: [id number]  HPI: Beth Bailey is a 68 y.o. female presenting on 12/18/2013 with Follow-up   Sister of Dustin Folks (with ileocecal carcinoma).  Takes care of husband with memory loss 2/2 stroke 2009. Takes care of sister and mother.   Fatty liver hx with transaminitis.  Intolerant of pravastatin 80mg  daily so she stopped this.  Has not tolerated simvastatin or lipitor.  Presents today to discuss stress of caring for family - husband, mother, sister.  Husband with dementia.  Pt does not have any time for herself.  At times short fuse with husband because he drops mugs.  Trouble sleeping at night.  Ongoing stress becoming overwhelming over last few months.  Husband doesn't like to go alone with his friends unless Bernell goes with him.  Relevant past medical, surgical, family and social history reviewed and updated as indicated.  Allergies and medications reviewed and updated. Current Outpatient Prescriptions on File Prior to Visit  Medication Sig  . acetaminophen (TYLENOL) 500 MG tablet Take 500-1,000 mg by mouth every 6 (six) hours as needed for pain.  . Biotin 5000 MCG CAPS Take 1 capsule by mouth daily.  . cetirizine (ZYRTEC) 10 MG tablet Take 10 mg by mouth daily.  Marland Kitchen co-enzyme Q-10 30 MG capsule Take 1 capsule (30 mg total) by mouth daily.  . colestipol (COLESTID) 1 G tablet Take 5 tablets with supper daily.  Marland Kitchen HYDROcodone-acetaminophen (NORCO/VICODIN) 5-325 MG per tablet Take 1 tablet by mouth every 6 (six) hours as needed for pain.  . hydroxypropyl methylcellulose (ISOPTO TEARS) 2.5 % ophthalmic solution Place 1 drop into both eyes 3 (three) times daily as needed (for dry eyes).  . Multiple Vitamin (MULTIVITAMIN) tablet Take 1 tablet by mouth daily.  Marland Kitchen omeprazole (PRILOSEC) 20 MG  capsule Take 1 capsule (20 mg total) by mouth daily.  . prednisoLONE acetate (PRED FORTE) 1 % ophthalmic suspension Place 1 drop into the right eye at bedtime.  . valACYclovir (VALTREX) 1000 MG tablet Take 1,000 mg by mouth daily.   No current facility-administered medications on file prior to visit.    Review of Systems Per HPI unless specifically indicated above    Objective:    BP 144/88  Pulse 88  Temp(Src) 98.1 F (36.7 C) (Oral)  Wt 135 lb 12.8 oz (61.598 kg)  Physical Exam  Nursing note and vitals reviewed. Constitutional: She appears well-developed and well-nourished. No distress.  Psychiatric: She has a normal mood and affect. Her behavior is normal. Judgment and thought content normal.  Tears with discussion of stressors       Assessment & Plan:   Problem List Items Addressed This Visit   Caregiver burden - Primary     Discussed caregiver burnout, discussed importance of respite care - pt unsure who she would ask for help. Discussed adult daycare, discussed private home health aide. Will start sertraline 25-50mg  daily.  RTC 6-8 wks for f/u. A total of 25 minutes were spent face-to-face with the patient during this encounter and over half of that time was spent on counseling and coordination of care        Follow up plan: Return in about 6 weeks (around 01/29/2014), or as needed, for follow up.

## 2013-12-18 NOTE — Progress Notes (Signed)
Pre visit review using our clinic review tool, if applicable. No additional management support is needed unless otherwise documented below in the visit note. 

## 2014-02-04 ENCOUNTER — Ambulatory Visit: Payer: Medicare HMO | Admitting: Family Medicine

## 2014-02-07 IMAGING — CR DG LUMBAR SPINE 2-3V
1 series · 1 of 1 positions shown · non-contrast
Comparison: MRI 11/09/2012.

CLINICAL DATA: Intraoperative localization.

LUMBAR SPINE - 2-3 VIEW

[view not recorded]
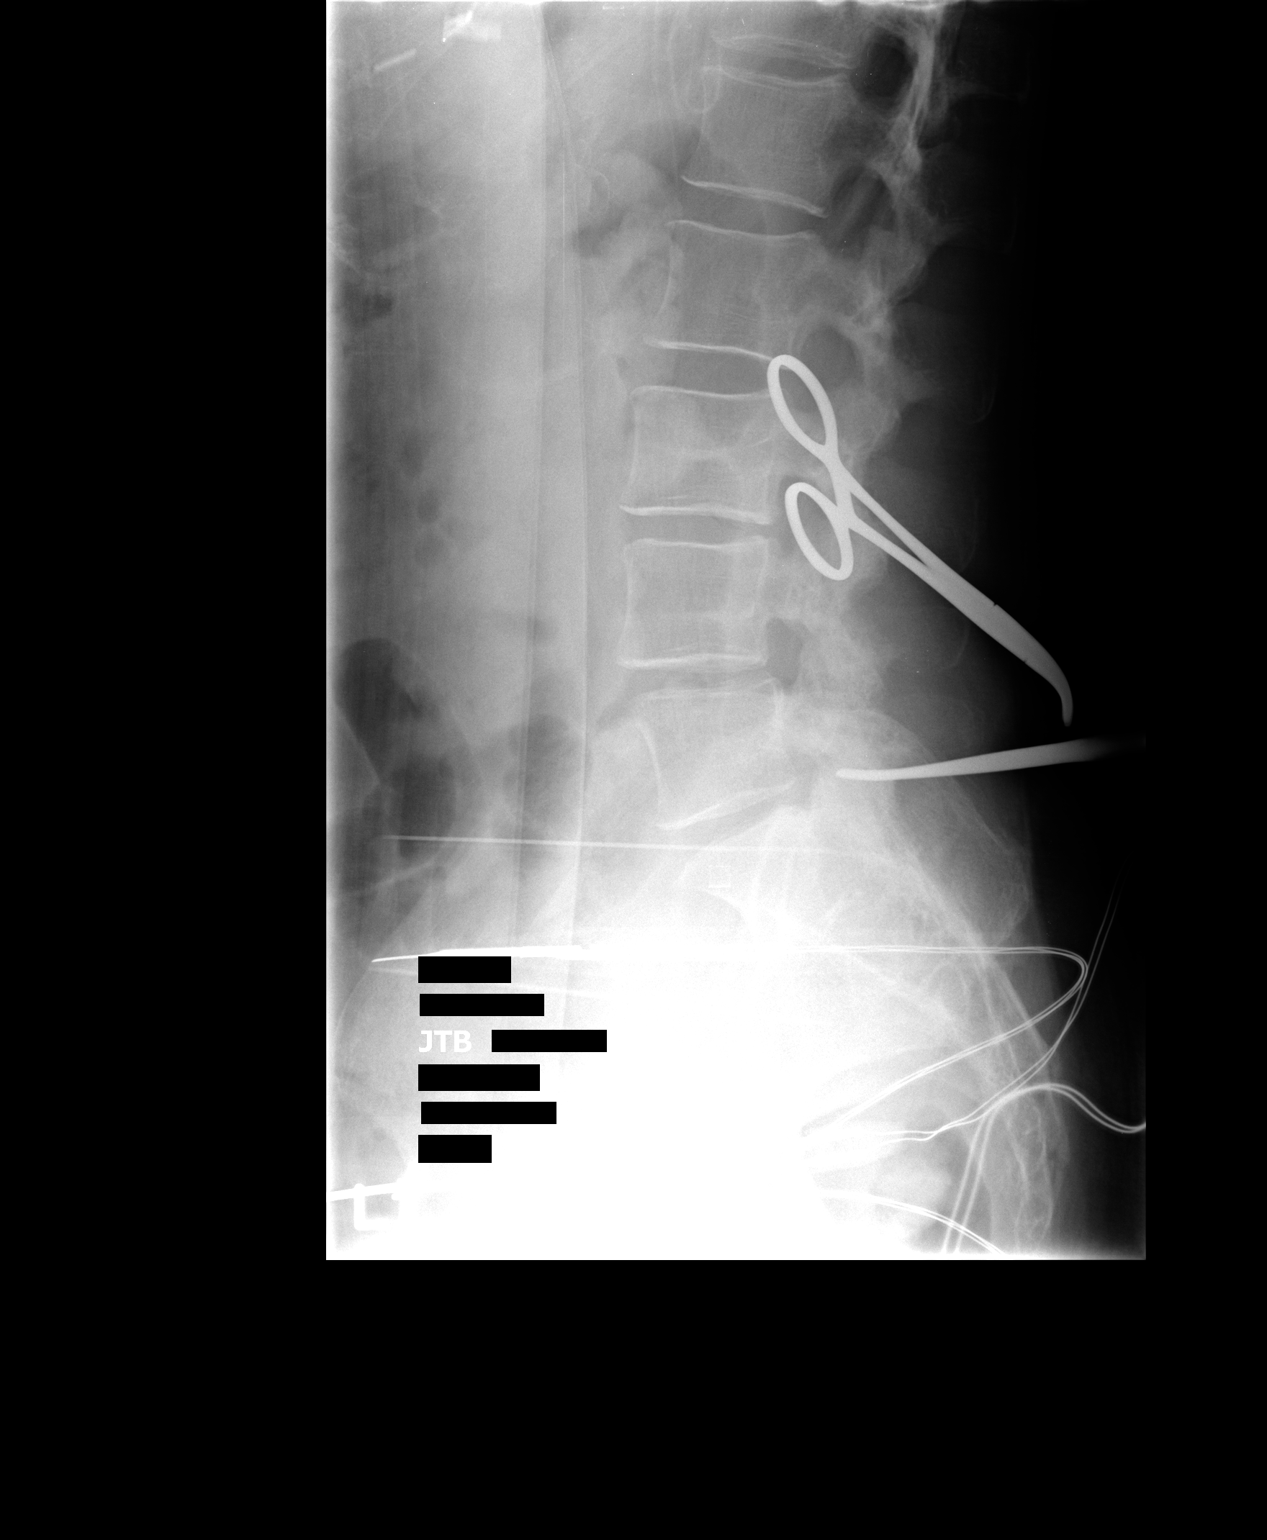

[1 of 1 positions shown; findings below may reference images not displayed]

FINDINGS: Lateral lumbar spine film labeled #1 demonstrates a
spinal needle projecting between the L4 and L5 spinous processes.

Film labeled #2 demonstrates a surgical instrument marking L5-S1.
IMPRESSION: L5-S1 marked intraoperatively.

## 2014-02-08 ENCOUNTER — Encounter: Payer: Self-pay | Admitting: Family Medicine

## 2014-02-08 ENCOUNTER — Ambulatory Visit (INDEPENDENT_AMBULATORY_CARE_PROVIDER_SITE_OTHER): Payer: Medicare HMO | Admitting: Family Medicine

## 2014-02-08 VITALS — BP 148/80 | HR 72 | Temp 98.2°F | Wt 134.5 lb

## 2014-02-08 DIAGNOSIS — E785 Hyperlipidemia, unspecified: Secondary | ICD-10-CM

## 2014-02-08 DIAGNOSIS — Z636 Dependent relative needing care at home: Secondary | ICD-10-CM

## 2014-02-08 DIAGNOSIS — Z6379 Other stressful life events affecting family and household: Secondary | ICD-10-CM

## 2014-02-08 NOTE — Assessment & Plan Note (Signed)
Significant improvement with sertraline 25-50mg  daily. Continue med.

## 2014-02-08 NOTE — Assessment & Plan Note (Signed)
Severe dyslipidemia, did not tolerate statin, did not tolerate cholestipol. Will do another trial of RYR with CoQ10, if not tolerated, consider zetia.

## 2014-02-08 NOTE — Progress Notes (Signed)
BP 148/80  Pulse 72  Temp(Src) 98.2 F (36.8 C) (Oral)  Wt 134 lb 8 oz (61.009 kg)   CC: 6 wk f/u  Subjective:    Patient ID: Beth Bailey, female    DOB: 1945/11/19, 68 y.o.   MRN: [id number]  HPI: [name] is a 68 y.o. female presenting on 02/08/2014 for Follow-up   See prior note for details. Sister of Beth Bailey (with ileocecal carcinoma).  Takes care of husband with memory loss 2/2 stroke 2009. Takes care of sister and mother. Briefly, seen here last month with concern for caregiver stress with burnout.  Started sertraline 50mg  daily and discussed other support.  Still only taking 1/2 tablet daily.  Feels this has significantly helped - better able to handle stress of caring for husband.  She has not looked into adult daycare or private company for a few hours of respite care.  Has started walking as well.  Also with h/o NAFLD - intolerant to statins in past.  Recommended RYR - not taking.  When she started it, started having hand pains.  May try another course.  Did not tolerate colestipol - GI upset.  May try smaller amt.  This was prescribed by GI for chronic diarrhea after a meal.  Relevant past medical, surgical, family and social history reviewed and updated as indicated.  Allergies and medications reviewed and updated. Current Outpatient Prescriptions on File Prior to Visit  Medication Sig  . acetaminophen (TYLENOL) 500 MG tablet Take 500-1,000 mg by mouth every 6 (six) hours as needed for pain.  . Biotin 5000 MCG CAPS Take 1 capsule by mouth daily.  . cetirizine (ZYRTEC) 10 MG tablet Take 10 mg by mouth daily.  Marland Kitchen co-enzyme Q-10 30 MG capsule Take 1 capsule (30 mg total) by mouth daily.  Marland Kitchen HYDROcodone-acetaminophen (NORCO/VICODIN) 5-325 MG per tablet Take 1 tablet by mouth every 6 (six) hours as needed for pain.  . hydroxypropyl methylcellulose (ISOPTO TEARS) 2.5 % ophthalmic solution Place 1 drop into both eyes 3 (three) times daily as needed (for dry eyes).   . Multiple Vitamin (MULTIVITAMIN) tablet Take 1 tablet by mouth daily.  Marland Kitchen omeprazole (PRILOSEC) 20 MG capsule Take 1 capsule (20 mg total) by mouth daily.  . prednisoLONE acetate (PRED FORTE) 1 % ophthalmic suspension Place 1 drop into the right eye at bedtime.  . valACYclovir (VALTREX) 1000 MG tablet Take 1,000 mg by mouth daily.  . colestipol (COLESTID) 1 G tablet Take 5 tablets with supper daily.  . Red Yeast Rice 600 MG CAPS Take 1 capsule by mouth daily.   No current facility-administered medications on file prior to visit.    Review of Systems Per HPI unless specifically indicated above    Objective:    BP 148/80  Pulse 72  Temp(Src) 98.2 F (36.8 C) (Oral)  Wt 134 lb 8 oz (61.009 kg)  Physical Exam  Nursing note and vitals reviewed. Constitutional: She appears well-developed and well-nourished. No distress.  Psychiatric: She has a normal mood and affect.       Assessment & Plan:   Problem List Items Addressed This Visit   HLD (hyperlipidemia)     Severe dyslipidemia, did not tolerate statin, did not tolerate cholestipol. Will do another trial of RYR with CoQ10, if not tolerated, consider zetia.    Caregiver burden - Primary     Significant improvement with sertraline 25-50mg  daily. Continue med.        Follow up plan: Return  if symptoms worsen or fail to improve.

## 2014-02-08 NOTE — Progress Notes (Signed)
Pre visit review using our clinic review tool, if applicable. No additional management support is needed unless otherwise documented below in the visit note. 

## 2014-02-08 NOTE — Patient Instructions (Signed)
Continue sertraline 25-50mg  daily. May do another trial of red yeast rice, but this time take with Co Q 10 (also over the counter) Good to see you today, call us with questions.

## 2014-03-09 ENCOUNTER — Other Ambulatory Visit: Payer: Self-pay | Admitting: Family Medicine

## 2014-03-26 ENCOUNTER — Ambulatory Visit: Payer: Medicare Other | Admitting: Family Medicine

## 2014-04-11 ENCOUNTER — Ambulatory Visit: Payer: Medicare HMO | Admitting: Family Medicine

## 2014-05-24 ENCOUNTER — Ambulatory Visit (INDEPENDENT_AMBULATORY_CARE_PROVIDER_SITE_OTHER): Payer: Medicare HMO | Admitting: Family Medicine

## 2014-05-24 ENCOUNTER — Encounter: Payer: Self-pay | Admitting: Family Medicine

## 2014-05-24 VITALS — BP 160/86 | HR 76 | Temp 98.3°F | Wt 132.5 lb

## 2014-05-24 DIAGNOSIS — K7689 Other specified diseases of liver: Secondary | ICD-10-CM

## 2014-05-24 DIAGNOSIS — I10 Essential (primary) hypertension: Secondary | ICD-10-CM

## 2014-05-24 DIAGNOSIS — K760 Fatty (change of) liver, not elsewhere classified: Secondary | ICD-10-CM

## 2014-05-24 DIAGNOSIS — E785 Hyperlipidemia, unspecified: Secondary | ICD-10-CM

## 2014-05-24 DIAGNOSIS — E119 Type 2 diabetes mellitus without complications: Secondary | ICD-10-CM

## 2014-05-24 LAB — COMPREHENSIVE METABOLIC PANEL
ALK PHOS: 68 U/L (ref 39–117)
ALT: 58 U/L — AB (ref 0–35)
AST: 49 U/L — ABNORMAL HIGH (ref 0–37)
Albumin: 4.2 g/dL (ref 3.5–5.2)
BUN: 12 mg/dL (ref 6–23)
CO2: 31 meq/L (ref 19–32)
CREATININE: 0.7 mg/dL (ref 0.4–1.2)
Calcium: 10 mg/dL (ref 8.4–10.5)
Chloride: 100 mEq/L (ref 96–112)
GFR: 89.8 mL/min (ref >60.00)
Glucose, Bld: 101 mg/dL — ABNORMAL HIGH (ref 70–99)
Potassium: 4.3 mEq/L (ref 3.5–5.1)
Sodium: 138 mEq/L (ref 135–145)
Total Bilirubin: 0.7 mg/dL (ref 0.2–1.2)
Total Protein: 7.1 g/dL (ref 6.0–8.3)

## 2014-05-24 LAB — CBC WITH DIFFERENTIAL/PLATELET
Basophils Absolute: 0.1 10*3/uL (ref 0.0–0.1)
Basophils Relative: 0.8 % (ref 0.0–3.0)
EOS ABS: 0.3 10*3/uL (ref 0.0–0.7)
Eosinophils Relative: 3.5 % (ref 0.0–5.0)
HEMATOCRIT: 42.2 % (ref 36.0–46.0)
Hemoglobin: 13.9 g/dL (ref 12.0–15.0)
Lymphocytes Relative: 39.6 % (ref 12.0–46.0)
Lymphs Abs: 2.8 10*3/uL (ref 0.7–4.0)
MCHC: 32.9 g/dL (ref 30.0–36.0)
MCV: 95.1 fl (ref 78.0–100.0)
MONO ABS: 0.5 10*3/uL (ref 0.1–1.0)
Monocytes Relative: 7.4 % (ref 3.0–12.0)
NEUTROS PCT: 48.7 % (ref 43.0–77.0)
Neutro Abs: 3.5 10*3/uL (ref 1.4–7.7)
Platelets: 182 10*3/uL (ref 150.0–400.0)
RBC: 4.43 Mil/uL (ref 3.87–5.11)
RDW: 13.9 % (ref 11.5–15.5)
WBC: 7.1 10*3/uL (ref 4.0–10.5)

## 2014-05-24 LAB — LDL CHOLESTEROL, DIRECT: Direct LDL: 199 mg/dL

## 2014-05-24 LAB — HEMOGLOBIN A1C: HEMOGLOBIN A1C: 6.6 % — AB (ref 4.6–6.5)

## 2014-05-24 LAB — LIPID PANEL
CHOL/HDL RATIO: 6
Cholesterol: 273 mg/dL — ABNORMAL HIGH (ref 0–200)
HDL: 48.6 mg/dL (ref >39.00)
NonHDL: 224.4
Triglycerides: 230 mg/dL — ABNORMAL HIGH (ref 0.0–149.0)
VLDL: 46 mg/dL — AB (ref 0.0–40.0)

## 2014-05-24 MED ORDER — LOSARTAN POTASSIUM 50 MG PO TABS: 50.0000 mg | ORAL_TABLET | Freq: Every day | ORAL | Status: DC

## 2014-05-24 NOTE — Progress Notes (Addendum)
BP 160/86  Pulse 76  Temp(Src) 98.3 F (36.8 C) (Oral)  Wt 132 lb 8 oz (60.102 kg)   CC: diabetic bundle  Subjective:    Patient ID: Beth Bailey, female    DOB: 03-07-1946, 68 y.o.   MRN: [id number]  HPI: Beth Bailey is a 68 y.o. female presenting on 05/24/2014 for Follow-up   HLD with NAFLD - intolerant to statins in past. Last visit we discussed retrial of RYR with CoQ10. Not consistent with this 2/2 body aches. Currently taking colestipol 3-4 tablets. This was prescribed by GI for chronic diarrhea after a meal. DM - diet controlled. Due for recheck. HTN - not on meds. Elevated today, attributes to being at doctor's office. Lab Results  Component Value Date   HGBA1C 6.7* 08/22/2013    2 wks ago fell flat onto L breast while outside. Bruise improving.  Relevant past medical, surgical, family and social history reviewed and updated as indicated.  Allergies and medications reviewed and updated. Current Outpatient Prescriptions on File Prior to Visit  Medication Sig  . acetaminophen (TYLENOL) 500 MG tablet Take 500-1,000 mg by mouth every 6 (six) hours as needed for pain.  . Biotin 5000 MCG CAPS Take 1 capsule by mouth daily.  . cetirizine (ZYRTEC) 10 MG tablet Take 10 mg by mouth daily.  Marland Kitchen co-enzyme Q-10 30 MG capsule Take 1 capsule (30 mg total) by mouth daily.  . colestipol (COLESTID) 1 G tablet Take 5 tablets with supper daily.  Marland Kitchen HYDROcodone-acetaminophen (NORCO/VICODIN) 5-325 MG per tablet Take 1 tablet by mouth every 6 (six) hours as needed for pain.  . hydroxypropyl methylcellulose (ISOPTO TEARS) 2.5 % ophthalmic solution Place 1 drop into both eyes 3 (three) times daily as needed (for dry eyes).  . Multiple Vitamin (MULTIVITAMIN) tablet Take 1 tablet by mouth daily.  Marland Kitchen omeprazole (PRILOSEC) 20 MG capsule TAKE ONE CAPSULE BY MOUTH DAILY  . prednisoLONE acetate (PRED FORTE) 1 % ophthalmic suspension Place 1 drop into the right eye at bedtime.  . Red Yeast Rice  600 MG CAPS Take 1 capsule by mouth daily.  . sertraline (ZOLOFT) 50 MG tablet Take 25-50 mg by mouth daily.  . valACYclovir (VALTREX) 1000 MG tablet Take 1,000 mg by mouth daily.   No current facility-administered medications on file prior to visit.    Review of Systems Per HPI unless specifically indicated above    Objective:    BP 160/86  Pulse 76  Temp(Src) 98.3 F (36.8 C) (Oral)  Wt 132 lb 8 oz (60.102 kg)  Physical Exam  Nursing note and vitals reviewed. Constitutional: She appears well-developed and well-nourished. No distress.  HENT:  Mouth/Throat: Oropharynx is clear and moist. No oropharyngeal exudate.  Cardiovascular: Normal rate, regular rhythm, normal heart sounds and intact distal pulses.   No murmur heard. Pulmonary/Chest: Effort normal and breath sounds normal. No respiratory distress. She has no wheezes. She has no rales.  Musculoskeletal: She exhibits no edema.  Skin: Skin is warm and dry. Bruising (left medial breast slowly resolving) noted. No rash noted.   Results for orders placed in visit on 11/28/13  HEPATIC FUNCTION PANEL      Result Value Ref Range   Total Bilirubin 0.5  0.3 - 1.2 mg/dL   Bilirubin, Direct 0.1  0.0 - 0.3 mg/dL   Alkaline Phosphatase 54  39 - 117 U/L   AST 44 (*) 0 - 37 U/L   ALT 56 (*) 0 - 35 U/L  Total Protein 7.4  6.0 - 8.3 g/dL   Albumin 4.2  3.5 - 5.2 g/dL  TSH      Result Value Ref Range   TSH 0.35  0.35 - 5.50 uIU/mL  FERRITIN      Result Value Ref Range   Ferritin 149.1  10.0 - 291.0 ng/mL  IBC PANEL      Result Value Ref Range   Iron 97  42 - 145 ug/dL   Transferrin 248.7  212.0 - 360.0 mg/dL   Saturation Ratios 27.9  20.0 - 50.0 %  HEPATITIS PANEL, ACUTE      Result Value Ref Range   Hepatitis B Surface Ag NEGATIVE  NEGATIVE   HCV Ab NEGATIVE  NEGATIVE   Hep B C IgM NON REACTIVE  NON REACTIVE   Hep A IgM NON REACTIVE  NON REACTIVE      Assessment & Plan:   Problem List Items Addressed This Visit    Diabetes type 2, controlled - Primary     Diet controlled. Check A1c today.    Relevant Medications      losartan (COZAAR) tablet   Other Relevant Orders      Hemoglobin A1c   HYPERTENSION      Elevated today - persistently elevated in past. BP Readings from Last 3 Encounters:  05/24/14 160/86  02/08/14 148/80  12/18/13 144/88  will start losartan 50mg  daily. Pt agrees with plan.    Relevant Medications      losartan (COZAAR) tablet   HLD (hyperlipidemia)     We will check cholesterol levels today - continue colestipol and RYR. If LDL above goal, will stop RYR and start zetia.    Relevant Medications      losartan (COZAAR) tablet   Other Relevant Orders      Lipid panel   NAFLD (nonalcoholic fatty liver disease)     Reviewed dx with patient, discussed importance of good blood pressure and sugar and chol control.    Relevant Orders      Comprehensive metabolic panel      CBC with Differential       Follow up plan: Return in about 3 months (around 08/24/2014), or as needed, for follow up.

## 2014-05-24 NOTE — Assessment & Plan Note (Signed)
Diet controlled Check A1c today 

## 2014-05-24 NOTE — Assessment & Plan Note (Signed)
We will check cholesterol levels today - continue colestipol and RYR. If LDL above goal, will stop RYR and start zetia.

## 2014-05-24 NOTE — Progress Notes (Signed)
Pre visit review using our clinic review tool, if applicable. No additional management support is needed unless otherwise documented below in the visit note. 

## 2014-05-24 NOTE — Patient Instructions (Signed)
Blood work today. Start losartan for blood pressure as yours was too high - start 50mg  daily (sent to pharmacy). Return in 2 weeks after starting losartan to recheck labwork. Monitor blood pressures at home We will call you with cholesterol level results and plan for cholesterol (may start new medicine instead of red yeast rice). Return to see me in 3 months for follow up

## 2014-05-24 NOTE — Assessment & Plan Note (Signed)
Reviewed dx with patient, discussed importance of good blood pressure and sugar and chol control.

## 2014-05-24 NOTE — Assessment & Plan Note (Addendum)
Elevated today - persistently elevated in past. BP Readings from Last 3 Encounters:  05/24/14 160/86  02/08/14 148/80  12/18/13 144/88  will start losartan 50mg  daily. Pt agrees with plan.

## 2014-05-25 ENCOUNTER — Other Ambulatory Visit: Payer: Self-pay | Admitting: Family Medicine

## 2014-05-25 MED ORDER — EZETIMIBE 10 MG PO TABS: 10.0000 mg | ORAL_TABLET | Freq: Every day | ORAL | Status: DC

## 2014-06-02 ENCOUNTER — Encounter: Payer: Self-pay | Admitting: Family Medicine

## 2014-06-02 ENCOUNTER — Other Ambulatory Visit: Payer: Self-pay | Admitting: Family Medicine

## 2014-06-02 DIAGNOSIS — E785 Hyperlipidemia, unspecified: Secondary | ICD-10-CM

## 2014-06-02 MED ORDER — LOVASTATIN 20 MG PO TABS
20.0000 mg | ORAL_TABLET | ORAL | Status: DC
Start: 2014-06-02 — End: 2014-07-26

## 2014-06-07 ENCOUNTER — Other Ambulatory Visit: Payer: Self-pay | Admitting: Family Medicine

## 2014-06-07 ENCOUNTER — Encounter: Payer: Self-pay | Admitting: *Deleted

## 2014-06-07 ENCOUNTER — Other Ambulatory Visit (INDEPENDENT_AMBULATORY_CARE_PROVIDER_SITE_OTHER): Payer: Medicare HMO

## 2014-06-07 DIAGNOSIS — I10 Essential (primary) hypertension: Secondary | ICD-10-CM

## 2014-06-07 LAB — CREATININE, SERUM: CREATININE: 0.6 mg/dL (ref 0.4–1.2)

## 2014-06-07 LAB — POTASSIUM: Potassium: 4.6 mEq/L (ref 3.5–5.1)

## 2014-06-13 ENCOUNTER — Other Ambulatory Visit: Payer: Self-pay

## 2014-06-13 DIAGNOSIS — Z1231 Encounter for screening mammogram for malignant neoplasm of breast: Secondary | ICD-10-CM

## 2014-07-16 ENCOUNTER — Other Ambulatory Visit: Payer: Self-pay | Admitting: *Deleted

## 2014-07-16 DIAGNOSIS — E119 Type 2 diabetes mellitus without complications: Secondary | ICD-10-CM

## 2014-07-16 DIAGNOSIS — I10 Essential (primary) hypertension: Secondary | ICD-10-CM

## 2014-07-16 MED ORDER — LOSARTAN POTASSIUM 50 MG PO TABS: 50.0000 mg | ORAL_TABLET | Freq: Every day | ORAL | Status: DC

## 2014-07-26 ENCOUNTER — Other Ambulatory Visit: Payer: Self-pay | Admitting: *Deleted

## 2014-07-26 MED ORDER — LOVASTATIN 20 MG PO TABS: 20.0000 mg | ORAL_TABLET | ORAL | Status: DC

## 2014-08-07 ENCOUNTER — Ambulatory Visit
Admission: RE | Admit: 2014-08-07 | Discharge: 2014-08-07 | Disposition: A | Discharge status: Home / Self Care | Payer: Medicare HMO | Source: Ambulatory Visit

## 2014-08-07 DIAGNOSIS — Z1231 Encounter for screening mammogram for malignant neoplasm of breast: Secondary | ICD-10-CM

## 2014-08-08 LAB — HM MAMMOGRAPHY: HM MAMMO: NORMAL

## 2014-08-09 ENCOUNTER — Encounter: Payer: Self-pay | Admitting: *Deleted

## 2014-08-20 LAB — HM DIABETES EYE EXAM

## 2014-08-27 ENCOUNTER — Ambulatory Visit (INDEPENDENT_AMBULATORY_CARE_PROVIDER_SITE_OTHER): Payer: Medicare HMO | Admitting: Family Medicine

## 2014-08-27 ENCOUNTER — Encounter: Payer: Self-pay | Admitting: Family Medicine

## 2014-08-27 VITALS — BP 148/78 | HR 72 | Temp 98.1°F | Wt 134.0 lb

## 2014-08-27 DIAGNOSIS — Z23 Encounter for immunization: Secondary | ICD-10-CM

## 2014-08-27 DIAGNOSIS — E119 Type 2 diabetes mellitus without complications: Secondary | ICD-10-CM

## 2014-08-27 DIAGNOSIS — E785 Hyperlipidemia, unspecified: Secondary | ICD-10-CM

## 2014-08-27 DIAGNOSIS — I10 Essential (primary) hypertension: Secondary | ICD-10-CM

## 2014-08-27 NOTE — Addendum Note (Signed)
Addended by: Royann Shivers A on: 08/27/2014 11:04 AM   Modules accepted: Orders

## 2014-08-27 NOTE — Progress Notes (Signed)
BP 148/78 mmHg  Pulse 72  Temp(Src) 98.1 F (36.7 C) (Oral)  Wt 134 lb (60.782 kg)   CC: 3 mo f/u visit  Subjective:    Patient ID: Beth Bailey, female    DOB: 1946-01-28, 68 y.o.   MRN: [id number]  HPI: Beth Bailey is a 68 y.o. female presenting on 08/27/2014 for Follow-up   Sister recently died from metastatic colon cancer  DM - regularly does not check sugars.  Compliant with antihyperglycemic regimen which includes: diet controlled.  Denies low sugars or hypoglycemic symptoms.  Denies paresthesias. Last diabetic eye exam last week, sees regularly (s/p corneal transplant).  Pneumovax: DUE.  Prevnar: DUE. Lab Results  Component Value Date   HGBA1C 6.6* 05/24/2014    HLD with NAFLD - intolerant to statins in past. Last visit we discussed retrial of RYR with CoQ10. Last visit we trialed lovastatin but pt had sxs even with this. Currently taking colestipol 3-4 tablets and zetia regularly. Colestipol was prescribed by GI for chronic diarrhea after a meal.  HTN - not on meds. Again elevated today, attributes to being at doctor's office. At home doesn't check.  Declines flu shot.   Relevant past medical, surgical, family and social history reviewed and updated as indicated.  Allergies and medications reviewed and updated. Current Outpatient Prescriptions on File Prior to Visit  Medication Sig  . acetaminophen (TYLENOL) 500 MG tablet Take 500-1,000 mg by mouth every 6 (six) hours as needed for pain.  . Biotin 5000 MCG CAPS Take 1 capsule by mouth daily.  . cetirizine (ZYRTEC) 10 MG tablet Take 10 mg by mouth daily.  Marland Kitchen co-enzyme Q-10 30 MG capsule Take 1 capsule (30 mg total) by mouth daily. (Patient taking differently: Take 100 mg by mouth daily. )  . colestipol (COLESTID) 1 G tablet Take 5 tablets with supper daily. (Patient taking differently: Take 3 tablets with supper daily.)  . ezetimibe (ZETIA) 10 MG tablet Take 1 tablet (10 mg total) by mouth daily.  .  hydroxypropyl methylcellulose (ISOPTO TEARS) 2.5 % ophthalmic solution Place 1 drop into both eyes 3 (three) times daily as needed (for dry eyes).  Marland Kitchen losartan (COZAAR) 50 MG tablet Take 1 tablet (50 mg total) by mouth daily.  . Multiple Vitamin (MULTIVITAMIN) tablet Take 1 tablet by mouth daily.  Marland Kitchen omeprazole (PRILOSEC) 20 MG capsule TAKE ONE CAPSULE BY MOUTH DAILY  . prednisoLONE acetate (PRED FORTE) 1 % ophthalmic suspension Place 1 drop into the right eye at bedtime.  . sertraline (ZOLOFT) 50 MG tablet Take 25-50 mg by mouth daily.  . valACYclovir (VALTREX) 1000 MG tablet Take 1,000 mg by mouth daily.   No current facility-administered medications on file prior to visit.    Review of Systems Per HPI unless specifically indicated above    Objective:    BP 148/78 mmHg  Pulse 72  Temp(Src) 98.1 F (36.7 C) (Oral)  Wt 134 lb (60.782 kg)  Physical Exam  Constitutional: She appears well-developed and well-nourished. No distress.  HENT:  Head: Normocephalic and atraumatic.  Right Ear: External ear normal.  Left Ear: External ear normal.  Nose: Nose normal.  Mouth/Throat: Oropharynx is clear and moist. No oropharyngeal exudate.  Eyes: Conjunctivae and EOM are normal. Pupils are equal, round, and reactive to light. No scleral icterus.  Neck: Normal range of motion. Neck supple.  Cardiovascular: Normal rate, regular rhythm, normal heart sounds and intact distal pulses.   No murmur heard. Pulmonary/Chest: Effort normal and breath  sounds normal. No respiratory distress. She has no wheezes. She has no rales.  Musculoskeletal: She exhibits no edema.  Diabetic foot exam: Normal inspection No skin breakdown Slight callus L sole Normal DP/PT pulses Normal sensation to light tough and monofilament Nails normal  Lymphadenopathy:    She has no cervical adenopathy.  Skin: Skin is warm and dry. No rash noted.  Psychiatric: She has a normal mood and affect.  Nursing note and vitals  reviewed.      Assessment & Plan:   Problem List Items Addressed This Visit    HLD (hyperlipidemia)    Intolerant to all statins even lovastatin. Will stop this. Retrial RYR 600mg  bid along with coQ10 and zeetia and colestipol. Recheck FLP next fasting labs.    Essential hypertension    Chronically mildly elevated off meds. Advised pt monitor at home and notify me if also >140/90 at home. Pt agrees with plan.    Diabetes type 2, controlled - Primary    Diet controlled. A1c at goal. Check labwork next visit for AMW. Foot exam today. prevnar today.    Relevant Orders      HM DIABETES FOOT EXAM (Completed)       Follow up plan: Return as needed, for annual exam, prior fasting for blood work.

## 2014-08-27 NOTE — Assessment & Plan Note (Signed)
Intolerant to all statins even lovastatin. Will stop this. Retrial RYR 600mg  bid along with coQ10 and zeetia and colestipol. Recheck FLP next fasting labs.

## 2014-08-27 NOTE — Assessment & Plan Note (Signed)
Chronically mildly elevated off meds. Advised pt monitor at home and notify me if also >140/90 at home. Pt agrees with plan.

## 2014-08-27 NOTE — Progress Notes (Signed)
Pre visit review using our clinic review tool, if applicable. No additional management support is needed unless otherwise documented below in the visit note. 

## 2014-08-27 NOTE — Patient Instructions (Addendum)
prevnar today.  Let's stop lovastatin. Ok to restart red yeast rice (OTC), aim for 600mg  twice daily. Continue co enzyme q10.  Keep an eye on blood pressures at home and let me know if consistently >140/90. Good to see you today, return in 4 months for medicare wellness visit, prior fasting for blood work

## 2014-08-27 NOTE — Assessment & Plan Note (Signed)
Diet controlled. A1c at goal. Check labwork next visit for AMW. Foot exam today. prevnar today.

## 2014-10-07 ENCOUNTER — Other Ambulatory Visit: Payer: Self-pay | Admitting: Family Medicine

## 2014-10-11 DIAGNOSIS — M8580 Other specified disorders of bone density and structure, unspecified site: Secondary | ICD-10-CM

## 2014-10-11 HISTORY — PX: OTHER SURGICAL HISTORY: SHX169

## 2014-10-11 HISTORY — DX: Other specified disorders of bone density and structure, unspecified site: M85.80

## 2014-10-17 ENCOUNTER — Other Ambulatory Visit: Payer: Self-pay | Admitting: Family Medicine

## 2014-11-25 ENCOUNTER — Other Ambulatory Visit: Payer: Medicare HMO

## 2014-11-26 ENCOUNTER — Other Ambulatory Visit (INDEPENDENT_AMBULATORY_CARE_PROVIDER_SITE_OTHER): Payer: PPO

## 2014-11-26 ENCOUNTER — Ambulatory Visit (INDEPENDENT_AMBULATORY_CARE_PROVIDER_SITE_OTHER): Payer: PPO | Admitting: Family Medicine

## 2014-11-26 ENCOUNTER — Encounter: Payer: Self-pay | Admitting: Family Medicine

## 2014-11-26 ENCOUNTER — Other Ambulatory Visit: Payer: Self-pay | Admitting: Family Medicine

## 2014-11-26 VITALS — BP 160/72 | HR 82 | Temp 98.5°F

## 2014-11-26 DIAGNOSIS — E119 Type 2 diabetes mellitus without complications: Secondary | ICD-10-CM

## 2014-11-26 DIAGNOSIS — R300 Dysuria: Secondary | ICD-10-CM

## 2014-11-26 DIAGNOSIS — R1032 Left lower quadrant pain: Secondary | ICD-10-CM | POA: Diagnosis not present

## 2014-11-26 DIAGNOSIS — K760 Fatty (change of) liver, not elsewhere classified: Secondary | ICD-10-CM

## 2014-11-26 DIAGNOSIS — I10 Essential (primary) hypertension: Secondary | ICD-10-CM

## 2014-11-26 DIAGNOSIS — M8580 Other specified disorders of bone density and structure, unspecified site: Secondary | ICD-10-CM

## 2014-11-26 DIAGNOSIS — E785 Hyperlipidemia, unspecified: Secondary | ICD-10-CM

## 2014-11-26 LAB — CBC WITH DIFFERENTIAL/PLATELET
Basophils Absolute: 0 10*3/uL (ref 0.0–0.1)
Basophils Relative: 0.4 % (ref 0.0–3.0)
EOS ABS: 0.3 10*3/uL (ref 0.0–0.7)
Eosinophils Relative: 3.2 % (ref 0.0–5.0)
HCT: 42.2 % (ref 36.0–46.0)
HEMOGLOBIN: 14.2 g/dL (ref 12.0–15.0)
LYMPHS PCT: 35.8 % (ref 12.0–46.0)
Lymphs Abs: 2.8 10*3/uL (ref 0.7–4.0)
MCHC: 33.6 g/dL (ref 30.0–36.0)
MCV: 94.2 fl (ref 78.0–100.0)
Monocytes Absolute: 0.5 10*3/uL (ref 0.1–1.0)
Monocytes Relative: 6.8 % (ref 3.0–12.0)
NEUTROS ABS: 4.2 10*3/uL (ref 1.4–7.7)
Neutrophils Relative %: 53.8 % (ref 43.0–77.0)
Platelets: 184 10*3/uL (ref 150.0–400.0)
RBC: 4.48 Mil/uL (ref 3.87–5.11)
RDW: 13.5 % (ref 11.5–15.5)
WBC: 7.9 10*3/uL (ref 4.0–10.5)

## 2014-11-26 LAB — TSH: TSH: 3.49 u[IU]/mL (ref 0.35–4.50)

## 2014-11-26 LAB — COMPREHENSIVE METABOLIC PANEL
ALK PHOS: 67 U/L (ref 39–117)
ALT: 49 U/L — AB (ref 0–35)
AST: 43 U/L — AB (ref 0–37)
Albumin: 4.6 g/dL (ref 3.5–5.2)
BILIRUBIN TOTAL: 0.5 mg/dL (ref 0.2–1.2)
BUN: 13 mg/dL (ref 6–23)
CHLORIDE: 103 meq/L (ref 96–112)
CO2: 31 mEq/L (ref 19–32)
CREATININE: 0.72 mg/dL (ref 0.40–1.20)
Calcium: 9.6 mg/dL (ref 8.4–10.5)
GFR: 85.37 mL/min (ref >60.00)
Glucose, Bld: 119 mg/dL — ABNORMAL HIGH (ref 70–99)
POTASSIUM: 4.6 meq/L (ref 3.5–5.1)
Sodium: 140 mEq/L (ref 135–145)
Total Protein: 7.4 g/dL (ref 6.0–8.3)

## 2014-11-26 LAB — POCT URINALYSIS DIPSTICK
Glucose, UA: NEGATIVE
KETONES UA: NEGATIVE
Leukocytes, UA: NEGATIVE
Nitrite, UA: NEGATIVE
PH UA: 6
Protein, UA: NEGATIVE
RBC UA: NEGATIVE
Spec Grav, UA: >=1.03
Urobilinogen, UA: 0.2

## 2014-11-26 LAB — LDL CHOLESTEROL, DIRECT: Direct LDL: 205 mg/dL

## 2014-11-26 LAB — MICROALBUMIN / CREATININE URINE RATIO
CREATININE, U: 100.6 mg/dL
Microalb Creat Ratio: 1.7 mg/g (ref 0.0–30.0)
Microalb, Ur: 1.7 mg/dL (ref 0.0–1.9)

## 2014-11-26 LAB — LIPID PANEL
CHOLESTEROL: 320 mg/dL — AB (ref 0–200)
HDL: 56.4 mg/dL (ref >39.00)
NonHDL: 263.6
TRIGLYCERIDES: 313 mg/dL — AB (ref 0.0–149.0)
Total CHOL/HDL Ratio: 6
VLDL: 62.6 mg/dL — ABNORMAL HIGH (ref 0.0–40.0)

## 2014-11-26 LAB — HEMOGLOBIN A1C: Hgb A1c MFr Bld: 6.6 % — ABNORMAL HIGH (ref 4.6–6.5)

## 2014-11-26 LAB — T4, FREE: Free T4: 0.68 ng/dL (ref 0.60–1.60)

## 2014-11-26 LAB — VITAMIN D 25 HYDROXY (VIT D DEFICIENCY, FRACTURES): VITD: 33.7 ng/mL (ref 30.00–100.00)

## 2014-11-26 NOTE — Addendum Note (Signed)
Addended by: Ria Bush on: 11/26/2014 12:05 PM   Modules accepted: Miquel Dunn

## 2014-11-26 NOTE — Progress Notes (Signed)
Pre visit review using our clinic review tool, if applicable. No additional management support is needed unless otherwise documented below in the visit note. 

## 2014-11-26 NOTE — Progress Notes (Addendum)
BP 160/72 mmHg  Pulse 82  Temp(Src) 98.5 F (36.9 C) (Oral)   CC: UTI?  Subjective:    Patient ID: Beth Bailey, female    DOB: Nov 28, 1945, 69 y.o.   MRN: [id number]  HPI: Beth Bailey is a 69 y.o. female presenting on 11/26/2014 for Urinary Tract Infection   Several month history of suprapubic discomfort along with dysuria, but denies urgency, frequency, hematuria, back or flank pain, nausea/vomiting or fevers or chills. No significant diarrhea or constipation. No noted blood in stool.   Doesn't drink water much.  No recent abx use.  No h/o kidney infections in the past.  Lab Results  Component Value Date   HGBA1C 6.6* 05/24/2014    Relevant past medical, surgical, family and social history reviewed and updated as indicated. Interim medical history since our last visit reviewed. Allergies and medications reviewed and updated. Current Outpatient Prescriptions on File Prior to Visit  Medication Sig  . acetaminophen (TYLENOL) 500 MG tablet Take 500-1,000 mg by mouth every 6 (six) hours as needed for pain.  . Biotin 5000 MCG CAPS Take 1 capsule by mouth daily.  . cetirizine (ZYRTEC) 10 MG tablet Take 10 mg by mouth daily.  Marland Kitchen co-enzyme Q-10 30 MG capsule Take 1 capsule (30 mg total) by mouth daily. (Patient taking differently: Take 100 mg by mouth daily. )  . colestipol (COLESTID) 1 G tablet Take 5 tablets with supper daily. (Patient taking differently: Take 3 tablets with supper daily.)  . ezetimibe (ZETIA) 10 MG tablet Take 1 tablet (10 mg total) by mouth daily.  . Flaxseed, Linseed, (GNP FLAXSEED PO) Take by mouth as directed.  . hydroxypropyl methylcellulose (ISOPTO TEARS) 2.5 % ophthalmic solution Place 1 drop into both eyes 3 (three) times daily as needed (for dry eyes).  Marland Kitchen losartan (COZAAR) 50 MG tablet Take 1 tablet (50 mg total) by mouth daily.  Marland Kitchen losartan (COZAAR) 50 MG tablet TAKE 1 TABLET BY MOUTH DAILY  . Multiple Vitamin (MULTIVITAMIN) tablet Take 1 tablet by  mouth daily.  Marland Kitchen omeprazole (PRILOSEC) 20 MG capsule TAKE ONE CAPSULE BY MOUTH DAILY  . prednisoLONE acetate (PRED FORTE) 1 % ophthalmic suspension Place 1 drop into the right eye at bedtime.  . Red Yeast Rice 600 MG CAPS Take 1 capsule by mouth 2 (two) times daily.  . sertraline (ZOLOFT) 50 MG tablet Take 25-50 mg by mouth daily.  . sertraline (ZOLOFT) 50 MG tablet TAKE ONE TABLET BY MOUTH ONCE DAILY  . valACYclovir (VALTREX) 1000 MG tablet Take 1,000 mg by mouth daily.  . vitamin B-12 (CYANOCOBALAMIN) 1000 MCG tablet Take 1,000 mcg by mouth daily.   No current facility-administered medications on file prior to visit.    Review of Systems Per HPI unless specifically indicated above     Objective:    BP 160/72 mmHg  Pulse 82  Temp(Src) 98.5 F (36.9 C) (Oral)  Wt Readings from Last 3 Encounters:  08/27/14 134 lb (60.782 kg)  05/24/14 132 lb 8 oz (60.102 kg)  02/08/14 134 lb 8 oz (61.009 kg)    Physical Exam  Constitutional: She appears well-developed and well-nourished. No distress.  Abdominal: Soft. Normal appearance and bowel sounds are normal. She exhibits no distension and no mass. There is no hepatosplenomegaly. There is tenderness (mild) in the suprapubic area and left lower quadrant. There is no rigidity, no rebound, no guarding, no CVA tenderness and negative Murphy's sign.  Psychiatric: She has a normal mood and affect.  Nursing note and vitals reviewed.  Results for orders placed or performed in visit on 11/26/14  POCT Urinalysis Dipstick  Result Value Ref Range   Color, UA Yellow    Clarity, UA Clear    Glucose, UA Negative    Bilirubin, UA 1+    Ketones, UA Negative    Spec Grav, UA >=1.030    Blood, UA Negative    pH, UA 6.0    Protein, UA Negative    Urobilinogen, UA 0.2    Nitrite, UA Negative    Leukocytes, UA Negative       Assessment & Plan:   Problem List Items Addressed This Visit    Dysuria - Primary    Story consistent with UTI - but micro  overall unrevealing except for concentrated. Will send UCx. Longstanding sxs. ?interstitial cystitis. Encouraged avoid bladder irritants and increase water intake.  Update if not improving as expected. Reassess next week at CPE. Pt agrees with plan. Not consistent with diverticulitis - but will add CBC to blood drawn this morning.      Relevant Orders   Urine culture    Other Visit Diagnoses    Burning with urination        Relevant Orders    POCT Urinalysis Dipstick (Completed)    LLQ pain        Relevant Orders    CBC with Differential/Platelet        Follow up plan: Return if symptoms worsen or fail to improve.

## 2014-11-26 NOTE — Patient Instructions (Addendum)
Urine looking ok but we will send a culture. In the meantime, increase water intake, don't hold your water, and avoid bladder irritants like caffeine and chocolate.

## 2014-11-26 NOTE — Assessment & Plan Note (Addendum)
Story consistent with UTI - but micro overall unrevealing except for concentrated. Will send UCx. Longstanding sxs. ?interstitial cystitis. Encouraged avoid bladder irritants and increase water intake.  Update if not improving as expected. Reassess next week at CPE. Pt agrees with plan. Not consistent with diverticulitis - but will add CBC to blood drawn this morning.

## 2014-11-27 LAB — URINE CULTURE: Colony Count: 5000

## 2014-12-02 ENCOUNTER — Ambulatory Visit (INDEPENDENT_AMBULATORY_CARE_PROVIDER_SITE_OTHER): Payer: PPO | Admitting: Family Medicine

## 2014-12-02 ENCOUNTER — Encounter: Payer: Self-pay | Admitting: Family Medicine

## 2014-12-02 VITALS — BP 142/84 | HR 72 | Temp 98.6°F | Ht 58.25 in | Wt 135.0 lb

## 2014-12-02 DIAGNOSIS — M8580 Other specified disorders of bone density and structure, unspecified site: Secondary | ICD-10-CM

## 2014-12-02 DIAGNOSIS — Z7189 Other specified counseling: Secondary | ICD-10-CM | POA: Insufficient documentation

## 2014-12-02 DIAGNOSIS — Z636 Dependent relative needing care at home: Secondary | ICD-10-CM

## 2014-12-02 DIAGNOSIS — E119 Type 2 diabetes mellitus without complications: Secondary | ICD-10-CM

## 2014-12-02 DIAGNOSIS — Z0000 Encounter for general adult medical examination without abnormal findings: Secondary | ICD-10-CM

## 2014-12-02 DIAGNOSIS — I10 Essential (primary) hypertension: Secondary | ICD-10-CM

## 2014-12-02 DIAGNOSIS — E785 Hyperlipidemia, unspecified: Secondary | ICD-10-CM

## 2014-12-02 DIAGNOSIS — D739 Disease of spleen, unspecified: Secondary | ICD-10-CM

## 2014-12-02 DIAGNOSIS — D7389 Other diseases of spleen: Secondary | ICD-10-CM | POA: Insufficient documentation

## 2014-12-02 DIAGNOSIS — K760 Fatty (change of) liver, not elsewhere classified: Secondary | ICD-10-CM

## 2014-12-02 NOTE — Progress Notes (Signed)
BP 142/84 mmHg  Pulse 72  Temp(Src) 98.6 F (37 C) (Oral)  Ht 4' 10.25" (1.48 m)  Wt 135 lb (61.236 kg)  BMI 27.96 kg/m2   CC: Medicare wellness visit  Subjective:    Patient ID: Beth Bailey, female    DOB: 1946/08/18, 69 y.o.   MRN: [id number]  HPI: Beth Bailey is a 69 y.o. female presenting on 12/02/2014 for Annual Exam   HLD - intolerant to several statins. Now on zetia 10mg  daily. Also on RYR, fish oil, flax seed oil, and colestipol.  HTN - bp runs 177-939 systolic at home. BP Readings from Last 3 Encounters:  12/02/14 142/84  11/26/14 160/72  08/27/14 148/78    Hearing screen passed today.   Vision screen already done this year so deferred today (2015) Denies falls or anhedonia/sadness/depression this year.  Preventative: Mammogram 07/2014 normal. Breast exam at home.  Last colonoscopy 10/20/2010 - h/o polyps, rec rpt 5 yrs Carlean Purl).  Last well woman with prior PCP 2013, s/p hysterectomy 1985, ovaries remain. Dexa - last done 2002 - osteopenia. Due for rpt. Flu - declines prevnar 08/2014. pneumovax - not due yet. H/o remote PNA infection. Tetanus - declines zostavax - declines Advanced directives: has this set up. Daughter Kearney Hard) is HCPOA.  Lives with husband, birds. Grown dogs.One daughter - neighbor Occupation: retired, worked at [employer]  Activity:planning on joining gym Diet: fruits/vegetables daily, some water   Relevant past medical, surgical, family and social history reviewed and updated as indicated. Interim medical history since our last visit reviewed. Allergies and medications reviewed and updated. Current Outpatient Prescriptions on File Prior to Visit  Medication Sig  . acetaminophen (TYLENOL) 500 MG tablet Take 500-1,000 mg by mouth every 6 (six) hours as needed for pain.  . Biotin 5000 MCG CAPS Take 1 capsule by mouth daily.  . cetirizine (ZYRTEC) 10 MG tablet Take 10 mg by mouth daily.  . colestipol  (COLESTID) 1 G tablet Take 5 tablets with supper daily. (Patient taking differently: Take 3 tablets with supper daily.)  . ezetimibe (ZETIA) 10 MG tablet Take 1 tablet (10 mg total) by mouth daily.  . Flaxseed, Linseed, (GNP FLAXSEED PO) Take by mouth as directed.  . hydroxypropyl methylcellulose (ISOPTO TEARS) 2.5 % ophthalmic solution Place 1 drop into both eyes 3 (three) times daily as needed (for dry eyes).  Marland Kitchen losartan (COZAAR) 50 MG tablet TAKE 1 TABLET BY MOUTH DAILY  . Multiple Vitamin (MULTIVITAMIN) tablet Take 1 tablet by mouth daily.  Marland Kitchen omeprazole (PRILOSEC) 20 MG capsule TAKE ONE CAPSULE BY MOUTH DAILY  . prednisoLONE acetate (PRED FORTE) 1 % ophthalmic suspension Place 1 drop into the right eye at bedtime.  . Red Yeast Rice 600 MG CAPS Take 1 capsule by mouth 2 (two) times daily.  . sertraline (ZOLOFT) 50 MG tablet TAKE ONE TABLET BY MOUTH ONCE DAILY  . valACYclovir (VALTREX) 1000 MG tablet Take 1,000 mg by mouth daily.  . vitamin B-12 (CYANOCOBALAMIN) 1000 MCG tablet Take 1,000 mcg by mouth daily.   No current facility-administered medications on file prior to visit.    Review of Systems Per HPI unless specifically indicated above     Objective:    BP 142/84 mmHg  Pulse 72  Temp(Src) 98.6 F (37 C) (Oral)  Ht 4' 10.25" (1.48 m)  Wt 135 lb (61.236 kg)  BMI 27.96 kg/m2  Wt Readings from Last 3 Encounters:  12/02/14 135 lb (61.236 kg)  08/27/14 134  lb (60.782 kg)  05/24/14 132 lb 8 oz (60.102 kg)    Physical Exam  Constitutional: She is oriented to person, place, and time. She appears well-developed and well-nourished. No distress.  HENT:  Head: Normocephalic and atraumatic.  Right Ear: Hearing, tympanic membrane, external ear and ear canal normal.  Left Ear: Hearing, tympanic membrane, external ear and ear canal normal.  Nose: Nose normal.  Mouth/Throat: Uvula is midline, oropharynx is clear and moist and mucous membranes are normal. No oropharyngeal exudate,  posterior oropharyngeal edema or posterior oropharyngeal erythema.  Eyes: Conjunctivae and EOM are normal. Pupils are equal, round, and reactive to light. No scleral icterus.  Neck: Normal range of motion. Neck supple. Carotid bruit is not present. No thyromegaly present.  Cardiovascular: Normal rate, regular rhythm, normal heart sounds and intact distal pulses.   No murmur heard. Pulses:      Radial pulses are 2+ on the right side, and 2+ on the left side.  Pulmonary/Chest: Effort normal and breath sounds normal. No respiratory distress. She has no wheezes. She has no rales.  Abdominal: Soft. Bowel sounds are normal. She exhibits no distension and no mass. There is no tenderness. There is no rebound and no guarding. Hernia confirmed negative in the right inguinal area and confirmed negative in the left inguinal area.  Genitourinary: Vagina normal. Pelvic exam was performed with patient supine. There is no rash, tenderness or lesion on the right labia. There is no rash, tenderness or lesion on the left labia. Cervix exhibits no motion tenderness. Right adnexum displays no mass, no tenderness and no fullness. Left adnexum displays no mass, no tenderness and no fullness.  Uterus absent. Pap not performed  Musculoskeletal: Normal range of motion. She exhibits no edema.  Lymphadenopathy:    She has no cervical adenopathy.       Right: No inguinal adenopathy present.       Left: No inguinal adenopathy present.  Neurological: She is alert and oriented to person, place, and time.  CN grossly intact, station and gait intact Recall 3/3  Calculation [id number]  Skin: Skin is warm and dry. No rash noted.  Psychiatric: She has a normal mood and affect. Her behavior is normal. Judgment and thought content normal.  Nursing note and vitals reviewed.      Assessment & Plan:   Problem List Items Addressed This Visit    Osteopenia    Ordered DEXA today.      Relevant Orders   DG Bone Density   NAFLD  (nonalcoholic fatty liver disease)    Continue to monitor. Discussed with patient.       Medicare annual wellness visit, subsequent - Primary    I have personally reviewed the Medicare Annual Wellness questionnaire and have noted 1. The patient's medical and social history 2. Their use of alcohol, tobacco or illicit drugs 3. Their current medications and supplements 4. The patient's functional ability including ADL's, fall risks, home safety risks and hearing or visual impairment. 5. Diet and physical activity 6. Evidence for depression or mood disorders The patients weight, height, BMI have been recorded in the chart.  Hearing and vision has been addressed. I have made referrals, counseling and provided education to the patient based review of the above and I have provided the pt with a written personalized care plan for preventive services. Provider list updated - see scanned questionairre. Reviewed preventative protocols and updated unless pt declined.       Lesion of spleen  HLD (hyperlipidemia)    Chronically uncontrolled, actually deteriorated some. Continue current meds. Recheck in 6 mo.      Essential hypertension    Chronic, mildly elevated. Continue losartan 50mg  daily. Consider increase if bp remaining above goal <140/90.      Diabetes type 2, controlled    Chronic, stable, diet controlled.      Caregiver burden    zoloft helpful - continue this med.      Advanced care planning/counseling discussion    Advanced directives: has this set up. Daughter Kearney Hard) is HCPOA.          Follow up plan: Return in about 6 months (around 06/02/2015), or as needed, for follow up visit.

## 2014-12-02 NOTE — Assessment & Plan Note (Signed)
Chronic, mildly elevated. Continue losartan 50mg  daily. Consider increase if bp remaining above goal <140/90.

## 2014-12-02 NOTE — Assessment & Plan Note (Addendum)
Continue to monitor. Discussed with patient.

## 2014-12-02 NOTE — Assessment & Plan Note (Signed)
zoloft helpful - continue this med.

## 2014-12-02 NOTE — Assessment & Plan Note (Signed)
Chronic, stable, diet controlled.  

## 2014-12-02 NOTE — Patient Instructions (Addendum)
Call your insurance about the shingles shot to see if it is covered or how much it would cost and where is cheaper (here or pharmacy).  If you want to receive here, call for nurse visit. Pass by Marion's office to schedule bone density scan. Cholesterol levels and liver remain elevated - return in 6 months for fasting lab visit and afterwards for follow up visit. Good to see you today, call us with questions.

## 2014-12-02 NOTE — Assessment & Plan Note (Signed)
Chronically uncontrolled, actually deteriorated some. Continue current meds. Recheck in 6 mo.

## 2014-12-02 NOTE — Assessment & Plan Note (Signed)
Advanced directives: has this set up. Daughter Kearney Hard) is HCPOA.

## 2014-12-02 NOTE — Assessment & Plan Note (Signed)
Ordered DEXA today.  

## 2014-12-02 NOTE — Assessment & Plan Note (Signed)

## 2014-12-02 NOTE — Progress Notes (Signed)
Pre visit review using our clinic review tool, if applicable. No additional management support is needed unless otherwise documented below in the visit note. 

## 2014-12-03 ENCOUNTER — Telehealth: Payer: Self-pay | Admitting: Family Medicine

## 2014-12-03 NOTE — Telephone Encounter (Signed)
emmi mailed  °

## 2014-12-11 ENCOUNTER — Telehealth: Payer: Self-pay | Admitting: Family Medicine

## 2014-12-11 NOTE — Telephone Encounter (Signed)
Spoke with patient.

## 2014-12-11 NOTE — Telephone Encounter (Signed)
Patient returned your call.  Patient said you can call her back at home or on her cell phone.

## 2014-12-17 ENCOUNTER — Ambulatory Visit (INDEPENDENT_AMBULATORY_CARE_PROVIDER_SITE_OTHER)
Admission: RE | Admit: 2014-12-17 | Discharge: 2014-12-17 | Disposition: A | Discharge status: Home / Self Care | Payer: PPO | Source: Ambulatory Visit | Attending: Family Medicine | Admitting: Family Medicine

## 2014-12-17 DIAGNOSIS — M8580 Other specified disorders of bone density and structure, unspecified site: Secondary | ICD-10-CM

## 2014-12-21 ENCOUNTER — Encounter: Payer: Self-pay | Admitting: Family Medicine

## 2014-12-23 ENCOUNTER — Encounter: Payer: Self-pay | Admitting: *Deleted

## 2015-01-07 ENCOUNTER — Other Ambulatory Visit: Payer: Self-pay | Admitting: Family Medicine

## 2015-02-07 ENCOUNTER — Other Ambulatory Visit: Payer: Self-pay | Admitting: Family Medicine

## 2015-03-11 ENCOUNTER — Other Ambulatory Visit: Payer: Self-pay | Admitting: Family Medicine

## 2015-05-06 ENCOUNTER — Telehealth: Payer: Self-pay | Admitting: Family Medicine

## 2015-05-06 ENCOUNTER — Ambulatory Visit: Payer: PPO | Admitting: Family Medicine

## 2015-05-06 NOTE — Telephone Encounter (Signed)
Error

## 2015-05-07 ENCOUNTER — Encounter: Payer: Self-pay | Admitting: Internal Medicine

## 2015-05-07 ENCOUNTER — Ambulatory Visit
Admission: RE | Admit: 2015-05-07 | Discharge: 2015-05-07 | Disposition: A | Discharge status: Home / Self Care | Payer: PPO | Source: Ambulatory Visit | Attending: Internal Medicine | Admitting: Internal Medicine

## 2015-05-07 ENCOUNTER — Telehealth: Payer: Self-pay | Admitting: Family Medicine

## 2015-05-07 ENCOUNTER — Ambulatory Visit (INDEPENDENT_AMBULATORY_CARE_PROVIDER_SITE_OTHER): Payer: PPO | Admitting: Internal Medicine

## 2015-05-07 ENCOUNTER — Other Ambulatory Visit: Payer: Self-pay | Admitting: Internal Medicine

## 2015-05-07 ENCOUNTER — Ambulatory Visit: Admission: RE | Admit: 2015-05-07 | Payer: PPO | Source: Ambulatory Visit

## 2015-05-07 VITALS — BP 150/88 | HR 56 | Temp 98.1°F

## 2015-05-07 DIAGNOSIS — M5441 Lumbago with sciatica, right side: Secondary | ICD-10-CM

## 2015-05-07 DIAGNOSIS — M545 Low back pain: Secondary | ICD-10-CM | POA: Diagnosis present

## 2015-05-07 MED ORDER — HYDROCODONE-ACETAMINOPHEN 5-325 MG PO TABS: 1.0000 | ORAL_TABLET | Freq: Four times a day (QID) | ORAL | Status: DC | PRN

## 2015-05-07 NOTE — Progress Notes (Signed)
Subjective:    Patient ID: Beth Bailey, female    DOB: 1946/07/24, 69 y.o.   MRN: [id number]  HPI  Pt presents to the clinic today with c/o low back pain. This started 3-4 months ago but has progressively gotten worse over the last 3-4 days. The pain is in her right lower back and radiates down the right leg and into her RLQ. She describes the pain as sharp, stabbing and burning. She has some associated numbness but denies tingling. Standing and bearing weight make the pain worse. Straightening her right leg out also makes the pain worse. She denies loss of bowel or bladder. She has been taking her husbands Norco with some relief. A heating pad has also been helpful. She has had a herniated lumbar disk in 2014 and subsequently underwent lumbar laminectomy/decopression by Dr. Christella Noa. She did have a MRIof the lumbar spine 10/2012 which showed:  IMPRESSION:  1. Moderate sized right paracentral disc extrusion at L5-S1. Slight cephalad extension of disc material results in posterior displacement of the right S1 nerve root. 2. A broad-based central disc protrusion, bilateral facet hypertrophy and a resulting anterolisthesis at L4-L5 contribute to moderate spinal stenosis with left greater than right lateral recess and mild biforaminal stenosis. 3. Moderate multifactorial spinal stenosis at L3-L4 with mild narrowing of both lateral recesses.  She did follow up with Dr. Christella Noa about 3 months ago because it was starting to heart again. She reports he gave her Naproxen to take. It did provide some relief but not in the last 3-4 days.  Review of Systems      Past Medical History  Diagnosis Date  . GERD (gastroesophageal reflux disease)     controlled with PPI  . Allergic rhinitis     prior on allergy shots  . Arthritis   . 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 HNP (herniated nucleus pulposus), lumbar 12/07/12    s/p surgery  . History of colon polyps     . HLD (hyperlipidemia)     intolerant of statins  . Hyperglycemia   . Colon polyps   . Osteopenia 2016    dexa T -1.5 hip  . NAFLD (nonalcoholic fatty liver disease) 12/2013    by [place]  . Lesion of spleen 2013    2cm, stable on recheck    Current Outpatient Prescriptions  Medication Sig Dispense Refill  . acetaminophen (TYLENOL) 500 MG tablet Take 500-1,000 mg by mouth every 6 (six) hours as needed for pain.    . Biotin 5000 MCG CAPS Take 1 capsule by mouth daily.    . cetirizine (ZYRTEC) 10 MG tablet Take 10 mg by mouth daily.    . Coenzyme Q10 (CO Q 10) 100 MG CAPS Take 1 capsule by mouth daily.    . colestipol (COLESTID) 1 G tablet Take 5 tablets with supper daily. (Patient taking differently: Take 3 tablets with supper daily.) 450 tablet 3  . ezetimibe (ZETIA) 10 MG tablet Take 1 tablet (10 mg total) by mouth daily. 30 tablet 6  . Fish Oil-Cholecalciferol (FISH OIL + D3) 1200-1000 MG-UNIT CAPS Take 1 capsule by mouth daily.    . Flaxseed, Linseed, (GNP FLAXSEED PO) Take by mouth as directed.    . hydroxypropyl methylcellulose (ISOPTO TEARS) 2.5 % ophthalmic solution Place 1 drop into both eyes 3 (three) times daily as needed (for dry eyes).    Marland Kitchen losartan (COZAAR) 50 MG tablet TAKE 1  TABLET BY MOUTH DAILY 30 tablet 6  . Multiple Vitamin (MULTIVITAMIN) tablet Take 1 tablet by mouth daily.    Marland Kitchen omeprazole (PRILOSEC) 20 MG capsule TAKE ONE (1) CAPSULE BY MOUTH EACH DAY 30 capsule 6  . omeprazole (PRILOSEC) 20 MG capsule TAKE ONE (1) CAPSULE BY MOUTH EACH DAY 30 capsule 3  . prednisoLONE acetate (PRED FORTE) 1 % ophthalmic suspension Place 1 drop into the right eye at bedtime.    . Red Yeast Rice 600 MG CAPS Take 1 capsule by mouth 2 (two) times daily.    . sertraline (ZOLOFT) 50 MG tablet TAKE ONE TABLET BY MOUTH ONCE DAILY 30 tablet 6  . valACYclovir (VALTREX) 1000 MG tablet Take 1,000 mg by mouth daily.    . vitamin B-12 (CYANOCOBALAMIN) 1000 MCG tablet Take 1,000 mcg by mouth daily.      No current facility-administered medications for this visit.    Allergies  Allergen Reactions  . Amoxicillin   . Atorvastatin Other (See Comments)    myalgia  . Diclofenac Sodium Swelling  . Erythromycin Swelling  . Lisinopril Other (See Comments)    Hyperkalemia  . Penicillins Hives  . Pravastatin Other (See Comments)    myalgia  . Simvastatin Other (See Comments)    muscle pain  . Statins Other (See Comments)    Myalgias to simvastatin, pravastatin, lovastatin, atorvastatin    Family History  Problem Relation Age of Onset  . Arthritis    . Diabetes Maternal Grandmother   . Dementia Maternal Grandmother   . Parkinson's disease Maternal Grandfather   . Colon cancer Sister   . Cancer Maternal Uncle     questionable type  . Lung cancer Paternal Uncle   . Heart attack Maternal Uncle   . Stroke Maternal Grandmother   . Liver disease Neg Hx   . Kidney disease Maternal Uncle     History   Social History  . Marital Status: Married    Spouse Name: N/A  . Number of Children: N/A  . Years of Education: N/A   Occupational History  . Not on file.   Social History Main Topics  . Smoking status: Never Smoker   . Smokeless tobacco: Never Used  . Alcohol Use: No  . Drug Use: No  . Sexual Activity: Yes    Birth Control/ Protection: Other-see comments   Other Topics Concern  . Not on file   Social History Narrative   Lives with husband, birds.  Grown dogs. One daughter - neighbor   Occupation: retired, worked at [employer]   [place] on joining gym   Diet: fruits/vegetables daily, some water   1-2 caffeinated beverages daily     Constitutional: Denies fever, malaise, fatigue, headache or abrupt weight changes.  Respiratory: Denies difficulty breathing, shortness of breath, cough or sputum production.   Cardiovascular: Denies chest pain, chest tightness, palpitations or swelling in the hands or feet.  Gastrointestinal: Denies abdominal  pain, bloating, constipation, diarrhea or blood in the stool.  GU: Denies urgency, frequency, pain with urination, burning sensation, blood in urine, odor or discharge. Musculoskeletal: Pt reports back pain. Denies decrease in range of motion, difficulty with gait,  swelling.  Skin: Denies redness, rashes, lesions or ulcercations.  Neurological: Denies dizziness, difficulty with memory, difficulty with speech or problems with balance and coordination.   No other specific complaints in a complete review of systems (except as listed in HPI above).  Objective:   Physical Exam   BP 150/88 mmHg  Pulse 56  Temp(Src) 98.1 F (36.7 C) (Oral)  Wt   SpO2 97% Wt Readings from Last 3 Encounters:  12/02/14 135 lb (61.236 kg)  08/27/14 134 lb (60.782 kg)  05/24/14 132 lb 8 oz (60.102 kg)    General: Appears her stated age, well developed, well nourished in NAD. Cardiovascular: Normal rate and rhythm. S1,S2 noted.  No murmur, rubs or gallops noted.  Pulmonary/Chest: Normal effort and positive vesicular breath sounds. No respiratory distress. No wheezes, rales or ronchi noted.  Musculoskeletal: Normal flexion and rotation to the right. Decreased extension and rotation to the left. Pain with palpation of the paraspinal muscles. No pain with palpation over the spine. Strength 5/5 BLE. Unable to perform SLR, pt could not get up on exam table. Neurological: Alert and oriented. Sensation intact to BLE.  BMET    Component Value Date/Time   NA 140 11/26/2014 1010   K 4.6 11/26/2014 1010   CL 103 11/26/2014 1010   CO2 31 11/26/2014 1010   GLUCOSE 119* 11/26/2014 1010   BUN 13 11/26/2014 1010   CREATININE 0.72 11/26/2014 1010   CALCIUM 9.6 11/26/2014 1010   GFRNONAA 89.66 07/08/2009 1007   GFRAA 130 06/25/2008 1555    Lipid Panel     Component Value Date/Time   CHOL 320* 11/26/2014 1010   TRIG 313.0* 11/26/2014 1010   HDL 56.40 11/26/2014 1010   CHOLHDL 6 11/26/2014 1010   VLDL 62.6*  11/26/2014 1010   LDLCALC 213* 01/06/2007 2120    CBC    Component Value Date/Time   WBC 7.9 11/26/2014 1010   RBC 4.48 11/26/2014 1010   HGB 14.2 11/26/2014 1010   HCT 42.2 11/26/2014 1010   PLT 184.0 11/26/2014 1010   MCV 94.2 11/26/2014 1010   MCH 32.3 12/07/2012 1245   MCHC 33.6 11/26/2014 1010   RDW 13.5 11/26/2014 1010   LYMPHSABS 2.8 11/26/2014 1010   MONOABS 0.5 11/26/2014 1010   EOSABS 0.3 11/26/2014 1010   BASOSABS 0.0 11/26/2014 1010    Hgb A1C Lab Results  Component Value Date   HGBA1C 6.6* 11/26/2014        Assessment & Plan:   Low back pain with right side sciatica:  Will obtain xray of lumbar spine today STAT MRI of lumbar spine ordered- see Rosaria Ferries on the way out to schedule RX for [employer] given, advised her not to take anyone else's medications Continue your heating pad Call Dr. Christella Noa and let him know what is going on  Will contact you as soon as I get the imaging results back, to ER if worse

## 2015-05-07 NOTE — Telephone Encounter (Signed)
Pt called to get results of mri that was done today @ [facility]

## 2015-05-07 NOTE — Progress Notes (Signed)
Pre visit review using our clinic review tool, if applicable. No additional management support is needed unless otherwise documented below in the visit note. 

## 2015-05-07 NOTE — Telephone Encounter (Signed)
Threasa Beards already has a result note about this

## 2015-05-07 NOTE — Patient Instructions (Signed)
Back Pain, Adult Low back pain is very common. About 1 in 5 people have back pain.The cause of low back pain is rarely dangerous. The pain often gets better over time.About half of people with a sudden onset of back pain feel better in just 2 weeks. About 8 in 10 people feel better by 6 weeks.  CAUSES Some common causes of back pain include:  Strain of the muscles or ligaments supporting the spine.  Wear and tear (degeneration) of the spinal discs.  Arthritis.  Direct injury to the back. DIAGNOSIS Most of the time, the direct cause of low back pain is not known.However, back pain can be treated effectively even when the exact cause of the pain is unknown.Answering your caregiver's questions about your overall health and symptoms is one of the most accurate ways to make sure the cause of your pain is not dangerous. If your caregiver needs more information, he or she may order lab work or imaging tests (X-rays or MRIs).However, even if imaging tests show changes in your back, this usually does not require surgery. HOME CARE INSTRUCTIONS For many people, back pain returns.Since low back pain is rarely dangerous, it is often a condition that people can learn to manageon their own.   Remain active. It is stressful on the back to sit or stand in one place. Do not sit, drive, or stand in one place for more than 30 minutes at a time. Take short walks on level surfaces as soon as pain allows.Try to increase the length of time you walk each day.  Do not stay in bed.Resting more than 1 or 2 days can delay your recovery.  Do not avoid exercise or work.Your body is made to move.It is not dangerous to be active, even though your back may hurt.Your back will likely heal faster if you return to being active before your pain is gone.  Pay attention to your body when you bend and lift. Many people have less discomfortwhen lifting if they bend their knees, keep the load close to their bodies,and  avoid twisting. Often, the most comfortable positions are those that put less stress on your recovering back.  Find a comfortable position to sleep. Use a firm mattress and lie on your side with your knees slightly bent. If you lie on your back, put a pillow under your knees.  Only take over-the-counter or prescription medicines as directed by your caregiver. Over-the-counter medicines to reduce pain and inflammation are often the most helpful.Your caregiver may prescribe muscle relaxant drugs.These medicines help dull your pain so you can more quickly return to your normal activities and healthy exercise.  Put ice on the injured area.  Put ice in a plastic bag.  Place a towel between your skin and the bag.  Leave the ice on for 15-20 minutes, 03-04 times a day for the first 2 to 3 days. After that, ice and heat may be alternated to reduce pain and spasms.  Ask your caregiver about trying back exercises and gentle massage. This may be of some benefit.  Avoid feeling anxious or stressed.Stress increases muscle tension and can worsen back pain.It is important to recognize when you are anxious or stressed and learn ways to manage it.Exercise is a great option. SEEK MEDICAL CARE IF:  You have pain that is not relieved with rest or medicine.  You have pain that does not improve in 1 week.  You have new symptoms.  You are generally not feeling well. SEEK   IMMEDIATE MEDICAL CARE IF:   You have pain that radiates from your back into your legs.  You develop new bowel or bladder control problems.  You have unusual weakness or numbness in your arms or legs.  You develop nausea or vomiting.  You develop abdominal pain.  You feel faint. Document Released: 09/27/2005 Document Revised: 03/28/2012 Document Reviewed: 01/29/2014 ExitCare Patient Information 2015 [employer]. This information is not intended to replace advice given to you by your health care provider. Make sure you  discuss any questions you have with your health care provider.  

## 2015-05-08 ENCOUNTER — Telehealth: Payer: Self-pay

## 2015-05-08 NOTE — Telephone Encounter (Signed)
Pt left v/m requesting copy of xrays and MRI recently done. When I called pt; pt was not available and I spoke with gentleman and he said if I was calling about xrays she had gone to hospital to pick them up. Nothing further needed.

## 2015-05-14 ENCOUNTER — Other Ambulatory Visit: Payer: Self-pay | Admitting: Neurosurgery

## 2015-05-16 ENCOUNTER — Other Ambulatory Visit (HOSPITAL_COMMUNITY): Payer: Self-pay | Admitting: *Deleted

## 2015-05-16 ENCOUNTER — Encounter (HOSPITAL_COMMUNITY)
Admission: RE | Admit: 2015-05-16 | Discharge: 2015-05-16 | Disposition: A | Discharge status: Home / Self Care | Payer: PPO | Source: Ambulatory Visit | Attending: Neurosurgery | Admitting: Neurosurgery

## 2015-05-16 ENCOUNTER — Encounter (HOSPITAL_COMMUNITY): Payer: Self-pay

## 2015-05-16 ENCOUNTER — Other Ambulatory Visit: Payer: Self-pay | Admitting: Neurosurgery

## 2015-05-16 DIAGNOSIS — Z0183 Encounter for blood typing: Secondary | ICD-10-CM | POA: Diagnosis not present

## 2015-05-16 DIAGNOSIS — Z01812 Encounter for preprocedural laboratory examination: Secondary | ICD-10-CM | POA: Diagnosis present

## 2015-05-16 DIAGNOSIS — Z01810 Encounter for preprocedural cardiovascular examination: Secondary | ICD-10-CM | POA: Diagnosis not present

## 2015-05-16 HISTORY — DX: Anxiety disorder, unspecified: F41.9

## 2015-05-16 HISTORY — DX: Essential (primary) hypertension: I10

## 2015-05-16 HISTORY — DX: Pneumonia, unspecified organism: J18.9

## 2015-05-16 HISTORY — DX: Personal history of other diseases of the digestive system: Z87.19

## 2015-05-16 LAB — CBC
HCT: 44.2 % (ref 36.0–46.0)
HEMOGLOBIN: 14.9 g/dL (ref 12.0–15.0)
MCH: 31.7 pg (ref 26.0–34.0)
MCHC: 33.7 g/dL (ref 30.0–36.0)
MCV: 94 fL (ref 78.0–100.0)
Platelets: 181 10*3/uL (ref 150–400)
RBC: 4.7 MIL/uL (ref 3.87–5.11)
RDW: 12.8 % (ref 11.5–15.5)
WBC: 6.9 10*3/uL (ref 4.0–10.5)

## 2015-05-16 LAB — BASIC METABOLIC PANEL
ANION GAP: 11 (ref 5–15)
BUN: 12 mg/dL (ref 6–20)
CHLORIDE: 101 mmol/L (ref 101–111)
CO2: 27 mmol/L (ref 22–32)
CREATININE: 0.74 mg/dL (ref 0.44–1.00)
Calcium: 9.9 mg/dL (ref 8.9–10.3)
GFR calc Af Amer: >60 mL/min (ref >60)
Glucose, Bld: 123 mg/dL — ABNORMAL HIGH (ref 65–99)
POTASSIUM: 4.3 mmol/L (ref 3.5–5.1)
Sodium: 139 mmol/L (ref 135–145)

## 2015-05-16 LAB — ABO/RH: ABO/RH(D): A POS

## 2015-05-16 LAB — SURGICAL PCR SCREEN
MRSA, PCR: NEGATIVE
STAPHYLOCOCCUS AUREUS: NEGATIVE

## 2015-05-16 LAB — TYPE AND SCREEN
ABO/RH(D): A POS
ANTIBODY SCREEN: NEGATIVE

## 2015-05-16 NOTE — Pre-Procedure Instructions (Signed)
Beth Bailey  05/16/2015      Your procedure is scheduled on  Monday, May 19, 2015 at 11:20 AM.   Report to [facility] Entrance "A" Admitting Office at 8:15 AM.   Call this number if you have problems the morning of surgery: [phone]    Remember:  Do not eat food or drink liquids after midnight Sunday, 05/18/15.  Take these medicines the morning of surgery with A SIP OF WATER: Cetirizine (Zyrtec), Omeprazole (Prilosec), Sertraline (Zoloft), eye drops, Hydrocodone - if needed  Stop Multivitamins, Herbal medications and Fish Oil as of today.   Do not wear jewelry, make-up or nail polish.  Do not wear lotions, powders, or perfumes.  You may wear deodorant.  Do not shave 48 hours prior to surgery.    Do not bring valuables to the hospital.  [facility] is not responsible for any belongings or valuables.  Contacts, dentures or bridgework may not be worn into surgery.  Leave your suitcase in the car.  After surgery it may be brought to your room.  For patients admitted to the hospital, discharge time will be determined by your treatment team.  Special instructions:  [facility] - Preparing for Surgery  Before surgery, you can play an important role.  Because skin is not sterile, your skin needs to be as free of germs as possible.  You can reduce the number of germs on you skin by washing with CHG (chlorahexidine gluconate) soap before surgery.  CHG is an antiseptic cleaner which kills germs and bonds with the skin to continue killing germs even after washing.  Please DO NOT use if you have an allergy to CHG or antibacterial soaps.  If your skin becomes reddened/irritated stop using the CHG and inform your nurse when you arrive at Short Stay.  Do not shave (including legs and underarms) for at least 48 hours prior to the first CHG shower.  You may shave your face.  Please follow these instructions carefully:   1.  Shower with CHG Soap the night before surgery and the                                 morning of Surgery.  2.  If you choose to wash your hair, wash your hair first as usual with your       normal shampoo.  3.  After you shampoo, rinse your hair and body thoroughly to remove the                      Shampoo.  4.  Use CHG as you would any other liquid soap.  You can apply chg directly       to the skin and wash gently with scrungie or a clean washcloth.  5.  Apply the CHG Soap to your body ONLY FROM THE NECK DOWN.        Do not use on open wounds or open sores.  Avoid contact with your eyes, ears, mouth and genitals (private parts).  Wash genitals (private parts) with your normal soap.  6.  Wash thoroughly, paying special attention to the area where your surgery        will be performed.  7.  Thoroughly rinse your body with warm water from the neck down.  8.  DO NOT shower/wash with your normal soap after using and rinsing off  the CHG Soap.  9.  Pat yourself dry with a clean towel.            10.  Wear clean pajamas.            11.  Place clean sheets on your bed the night of your first shower and do not        sleep with pets.  Day of Surgery  Do not apply any lotions the morning of surgery.  Please wear clean clothes to the hospital.    Please read over the following fact sheets that you were given. Pain Booklet, Coughing and Deep Breathing, Blood Transfusion Information, MRSA Information and Surgical Site Infection Prevention

## 2015-05-19 ENCOUNTER — Inpatient Hospital Stay (HOSPITAL_COMMUNITY): Admission: RE | Admit: 2015-05-19 | Payer: PPO | Source: Ambulatory Visit | Admitting: Neurosurgery

## 2015-05-19 ENCOUNTER — Encounter (HOSPITAL_COMMUNITY): Admission: RE | Payer: Self-pay | Source: Ambulatory Visit

## 2015-05-19 SURGERY — POSTERIOR LUMBAR FUSION 1 LEVEL
Anesthesia: General

## 2015-05-27 ENCOUNTER — Telehealth: Payer: Self-pay | Admitting: Family Medicine

## 2015-05-27 DIAGNOSIS — M1990 Unspecified osteoarthritis, unspecified site: Secondary | ICD-10-CM

## 2015-05-27 DIAGNOSIS — M5126 Other intervertebral disc displacement, lumbar region: Secondary | ICD-10-CM

## 2015-05-27 NOTE — Telephone Encounter (Signed)
Seen with husband today. Requests referral for second opinion on back surgery - to [facility] Referral placed today.

## 2015-05-28 ENCOUNTER — Telehealth: Payer: Self-pay | Admitting: Family Medicine

## 2015-05-28 ENCOUNTER — Other Ambulatory Visit: Payer: Self-pay | Admitting: Family Medicine

## 2015-05-28 DIAGNOSIS — E785 Hyperlipidemia, unspecified: Secondary | ICD-10-CM

## 2015-05-28 DIAGNOSIS — K760 Fatty (change of) liver, not elsewhere classified: Secondary | ICD-10-CM

## 2015-05-28 DIAGNOSIS — E119 Type 2 diabetes mellitus without complications: Secondary | ICD-10-CM

## 2015-05-28 NOTE — Telephone Encounter (Signed)
LVM to cb

## 2015-05-28 NOTE — Telephone Encounter (Signed)
Scheduled 1245 tomorrow 05/28/15

## 2015-05-28 NOTE — Telephone Encounter (Signed)
Dr. Darnell Level, Can you please see if you can work in pt tomorrow 05/29/15 to discuss her back surgery options? I spoke with Evangelia today and she is really conflicted on what she needs to do. I explained before we can move forward with [facility] second opinion I needed to make sure I had all of her previous surgeries records (which I do have op notes) but I do not see office visits notes from this year. And then there is a possibility that [facility] can deny her as a patient if they think they are unable to help her. I told her we would need to act on this quickly as she does have her 2nd surgery already scheduled. I suggested seeing you 1st to go over in detail other options. Pt states she is feeling better. A few weeks ago pt stated the pain was unbearable and hard to walk (even using walker), but now after some pain meds she is feeling much better and doesn't know if she now even needs surgery. Do you think you can work her in?

## 2015-05-28 NOTE — Telephone Encounter (Signed)
Yes let's schedule her at 12:45pm. Thanks!

## 2015-05-29 ENCOUNTER — Ambulatory Visit (INDEPENDENT_AMBULATORY_CARE_PROVIDER_SITE_OTHER): Payer: PPO | Admitting: Family Medicine

## 2015-05-29 ENCOUNTER — Other Ambulatory Visit (INDEPENDENT_AMBULATORY_CARE_PROVIDER_SITE_OTHER): Payer: PPO

## 2015-05-29 ENCOUNTER — Encounter: Payer: Self-pay | Admitting: Family Medicine

## 2015-05-29 VITALS — BP 134/70 | HR 88 | Temp 98.0°F | Wt 129.0 lb

## 2015-05-29 DIAGNOSIS — E785 Hyperlipidemia, unspecified: Secondary | ICD-10-CM

## 2015-05-29 DIAGNOSIS — K760 Fatty (change of) liver, not elsewhere classified: Secondary | ICD-10-CM

## 2015-05-29 DIAGNOSIS — E119 Type 2 diabetes mellitus without complications: Secondary | ICD-10-CM

## 2015-05-29 DIAGNOSIS — M5126 Other intervertebral disc displacement, lumbar region: Secondary | ICD-10-CM

## 2015-05-29 LAB — HEPATIC FUNCTION PANEL
ALBUMIN: 4.5 g/dL (ref 3.5–5.2)
ALK PHOS: 49 U/L (ref 39–117)
ALT: 29 U/L (ref 0–35)
AST: 31 U/L (ref 0–37)
Bilirubin, Direct: 0.1 mg/dL (ref 0.0–0.3)
Total Bilirubin: 0.4 mg/dL (ref 0.2–1.2)
Total Protein: 7.1 g/dL (ref 6.0–8.3)

## 2015-05-29 LAB — LDL CHOLESTEROL, DIRECT: Direct LDL: 191 mg/dL

## 2015-05-29 LAB — LIPID PANEL
Cholesterol: 293 mg/dL — ABNORMAL HIGH (ref 0–200)
HDL: 48.5 mg/dL
NonHDL: 244.37
Total CHOL/HDL Ratio: 6
Triglycerides: 244 mg/dL — ABNORMAL HIGH (ref 0.0–149.0)
VLDL: 48.8 mg/dL — ABNORMAL HIGH (ref 0.0–40.0)

## 2015-05-29 LAB — HEMOGLOBIN A1C: Hgb A1c MFr Bld: 6.5 % (ref 4.6–6.5)

## 2015-05-29 MED ORDER — GABAPENTIN 100 MG PO CAPS: 100.0000 mg | ORAL_CAPSULE | Freq: Two times a day (BID) | ORAL | Status: DC

## 2015-05-29 MED ORDER — COLESTIPOL HCL 1 G PO TABS: ORAL_TABLET | ORAL | Status: DC

## 2015-05-29 NOTE — Patient Instructions (Addendum)
Good to see you today. Call us with quesitons.  Let's try gabapentin100mg  once to twice daily for nerve pain.  Keep upcoming appointment.

## 2015-05-29 NOTE — Progress Notes (Signed)
Pre visit review using our clinic review tool, if applicable. No additional management support is needed unless otherwise documented below in the visit note. 

## 2015-05-29 NOTE — Assessment & Plan Note (Addendum)
Discussed MRI results as well as upcoming surgery. Persistent pain, obvious pathology on MRI, discussed reasonable to proceed with surgery. Offered referral for 2nd opinion. Pt declines, wants to proceed with current neurosurgeon. Will trial gabapentin for back pain while she awaits surgery. Pt agrees with plan.

## 2015-05-29 NOTE — Progress Notes (Signed)
BP 134/70 mmHg  Pulse 88  Temp(Src) 98 F (36.7 C) (Oral)  Wt 129 lb (58.514 kg)  SpO2 99%   CC: discuss back surgery options  Subjective:    Patient ID: Beth Bailey, female    DOB: Apr 25, 1946, 69 y.o.   MRN: [id number]  HPI: Beth Bailey is a 69 y.o. female presenting on 05/29/2015 for Follow-up   Seen 05/07/2015 with recurrent lower back pain over 4 months acutely worsening, described as R LBP with radiation down R leg and associated numbness. Treated with naprosyn, norco, MRI, referral back to spine surgeon who recommended rpt surgery (spinal fusion). However, in the last 1-2 weeks back pain has been slowly improving (but persists - R lower back with radiation down R leg into foot). Some chronic R foot numbness. Denies weakness of leg, bowel/bladder incontinence, fever. She continues taking hydrocodone 5/325mg  3-4x/day every 6 hours and ibuprofen. She has not been treated with prednisone this time. Pt having second thoughts about undergoing surgery.   MRI showed progressive disc protrusion and facet hypertrophy R>L with severe subarticular R>L stenosis (L3/4), superior disc extrusion vs free fragment R subarticular space leading to severe R subarticular stenosis and moderate central canal stenosis (L4/5), and some progressive moderate facet hypertrophy (L5/S1).  H/o L5/S1 laminectomy/decompression by Dr Christella Noa (2014)  Relevant past medical, surgical, family and social history reviewed and updated as indicated. Interim medical history since our last visit reviewed. Allergies and medications reviewed and updated. Current Outpatient Prescriptions on File Prior to Visit  Medication Sig  . Biotin 5000 MCG CAPS Take 1 capsule by mouth daily.  . cetirizine (ZYRTEC) 10 MG tablet Take 10 mg by mouth daily.  . Coenzyme Q10 (CO Q 10) 100 MG CAPS Take 1 capsule by mouth daily.  Marland Kitchen ezetimibe (ZETIA) 10 MG tablet Take 1 tablet (10 mg total) by mouth daily.  . Fish Oil-Cholecalciferol  (FISH OIL + D3) 1200-1000 MG-UNIT CAPS Take 1 capsule by mouth daily.  . Flaxseed, Linseed, (GNP FLAXSEED PO) Take 1 tablet by mouth daily.   Marland Kitchen HYDROcodone-acetaminophen (NORCO/VICODIN) 5-325 MG per tablet Take 1 tablet by mouth every 6 (six) hours as needed for moderate pain.  . hydroxypropyl methylcellulose (ISOPTO TEARS) 2.5 % ophthalmic solution Place 1 drop into both eyes 3 (three) times daily as needed (for dry eyes).  Marland Kitchen losartan (COZAAR) 50 MG tablet TAKE 1 TABLET BY MOUTH DAILY  . Multiple Vitamin (MULTIVITAMIN) tablet Take 1 tablet by mouth daily.  Marland Kitchen omeprazole (PRILOSEC) 20 MG capsule TAKE ONE (1) CAPSULE BY MOUTH EACH DAY  . prednisoLONE acetate (PRED FORTE) 1 % ophthalmic suspension Place 1 drop into the right eye at bedtime.  . Red Yeast Rice 600 MG CAPS Take 1 capsule by mouth 2 (two) times daily.  . sertraline (ZOLOFT) 50 MG tablet TAKE ONE TABLET BY MOUTH ONCE DAILY  . valACYclovir (VALTREX) 1000 MG tablet Take 1,000 mg by mouth at bedtime.   . vitamin B-12 (CYANOCOBALAMIN) 1000 MCG tablet Take 1,000 mcg by mouth daily.   No current facility-administered medications on file prior to visit.   Past Surgical History  Procedure Laterality Date  . Abdominal hysterectomy  1985    ovaries remain.  endometriosis, abnormal pap as well  . Corneal transplant Right 2012  . Cataract extraction Right 2013  . Carpal tunnel release Bilateral   . Cholecystectomy  2007  . Lumbar laminectomy/decompression microdiscectomy Right 12/07/2012    Procedure: LUMBAR LAMINECTOMY/DECOMPRESSION MICRODISCECTOMY 1 LEVEL;  Surgeon: Beth Ishihara  Osie Bond, MD; Laterality: Right;  Right lumbar five-sacral one diskectomy  . Neck surgery  1990s    plate in neck  . Colonoscopy  10/2010    polyp , mild divertic, int hem, rec rpt 5 yrs Carlean Purl)  . Dexa  2016    osteopenia T-1.5  . Tonsillectomy     Review of Systems Per HPI unless specifically indicated above     Objective:    BP 134/70 mmHg  Pulse 88   Temp(Src) 98 F (36.7 C) (Oral)  Wt 129 lb (58.514 kg)  SpO2 99%  Wt Readings from Last 3 Encounters:  05/29/15 129 lb (58.514 kg)  05/16/15 128 lb 4.8 oz (58.196 kg)  12/02/14 135 lb (61.236 kg)    Physical Exam  Constitutional: She is oriented to person, place, and time. She appears well-developed and well-nourished. No distress.  Musculoskeletal: She exhibits no edema.  + pain midline lower lumbar spine No paraspinous mm tenderness + SLR on right   Neurological: She is alert and oriented to person, place, and time. She has normal strength. No sensory deficit. Coordination and gait normal.  Reflex Scores:      Patellar reflexes are 3+ on the right side and 3+ on the left side. 5/5 strength BLE Sensation intact  Nursing note and vitals reviewed.   MRI LUMBAR SPINE WITHOUT CONTRAST TECHNIQUE: Multiplanar, multisequence MR imaging of the lumbar spine was performed. No intravenous contrast was administered. COMPARISON: MRI of the lumbar spine 11/09/2012 FINDINGS: Normal signal is present in the conus medullaris which terminates at L1, within normal limits. Grade 1 anterolisthesis at L4-5 is not significantly changed. Slight anterolisthesis is again noted at L3-4. Focal levoconvex curvature of the lumbar spine is centered at L4.  Limited imaging the abdomen is unremarkable. No significant adenopathy is present.  L1-2: Negative.  L2-3: Negative.  L3-4: A progressive broad-based disc protrusion and moderate facet hypertrophy, right greater than left, results in moderate to severe right and moderate left subarticular stenosis with some progression. Mild foraminal narrowing is slightly worse than on the prior exam.  L4-5: A superior disc extrusion versus free fragment is noted within the right subarticular space posterior to the L4 vertebral body. This contributes to severe right subarticular stenosis. The broad-based component of the disc protrusion and  progressive moderate facet hypertrophy leads to moderate central canal stenosis with left greater than right subarticular narrowing at the discs level. There slight progression in moderate bilateral foraminal stenosis with uncovering of the disc. Stenosis is worse on the right.  L5-S1: Progressive moderate facet hypertrophy is present. Previously noted rightward disc protrusion is no longer present. There is slight progression of mild left foraminal narrowing due to facet hypertrophy.  IMPRESSION: 1. Progressive broad-based disc protrusion and facet hypertrophy at L3-4 results in moderate to severe right and moderate left subarticular stenosis. 2. Slight progression of mild foraminal narrowing bilaterally at L3-4. 3. Right superior disc extrusion within the right subarticular space at L4-5 extends superiorly 16 mm from the inferior endplate of L4. 4. Severe right subarticular stenosis at L4-5 secondary to the disc protrusion. 5. Left greater than right subarticular stenosis at the disc level of L4-5. 6. Slight progression in moderate bilateral foraminal stenosis at L4-5, worse on the right. 7. Slight progression of mild left foraminal stenosis at L5-S1 due to facet hypertrophy. 8. Interval resolution of rightward disc protrusion at L5-S1. Electronically Signed  By: San Morelle M.D.  On: 05/07/2015 13:48    Assessment & Plan:  Over 25 minutes were spent face-to-face with the patient during this encounter and >50% of that time was spent on counseling and coordination of care  Problem List Items Addressed This Visit    HNP (herniated nucleus pulposus), lumbar - Primary    Discussed MRI results as well as upcoming surgery. Persistent pain, obvious pathology on MRI, discussed reasonable to proceed with surgery. Offered referral for 2nd opinion. Pt declines, wants to proceed with current neurosurgeon. Will trial gabapentin for back pain while she awaits surgery. Pt  agrees with plan.          Follow up plan: No Follow-up on file.

## 2015-06-02 ENCOUNTER — Other Ambulatory Visit: Payer: Self-pay | Admitting: Family Medicine

## 2015-06-03 ENCOUNTER — Encounter: Payer: Self-pay | Admitting: Family Medicine

## 2015-06-03 ENCOUNTER — Ambulatory Visit (INDEPENDENT_AMBULATORY_CARE_PROVIDER_SITE_OTHER): Payer: PPO | Admitting: Family Medicine

## 2015-06-03 VITALS — BP 148/62 | HR 84 | Temp 98.2°F | Wt 128.0 lb

## 2015-06-03 DIAGNOSIS — E119 Type 2 diabetes mellitus without complications: Secondary | ICD-10-CM | POA: Diagnosis not present

## 2015-06-03 DIAGNOSIS — E785 Hyperlipidemia, unspecified: Secondary | ICD-10-CM

## 2015-06-03 DIAGNOSIS — I10 Essential (primary) hypertension: Secondary | ICD-10-CM

## 2015-06-03 MED ORDER — LOSARTAN POTASSIUM 25 MG PO TABS: 25.0000 mg | ORAL_TABLET | Freq: Every day | ORAL | Status: DC

## 2015-06-03 MED ORDER — EZETIMIBE 10 MG PO TABS: 10.0000 mg | ORAL_TABLET | Freq: Every day | ORAL | Status: DC

## 2015-06-03 NOTE — Patient Instructions (Addendum)
Try lower losartan dose of 25mg  daily. New dose sent to your pharmacy. Your cholesterol is too high. Restart colestipol and zetia.  Continue red yeast rice and fish oil. Recheck next labwork. Return in 6 months for medicare wellness visit

## 2015-06-03 NOTE — Progress Notes (Signed)
BP 148/62 mmHg  Pulse 84  Temp(Src) 98.2 F (36.8 C) (Oral)  Wt 128 lb (58.06 kg)  SpO2 98%   CC: 6 mo f/u visit  Subjective:    Patient ID: Beth Bailey, female    DOB: 1945-12-14, 69 y.o.   MRN: [id number]  HPI: Beth Bailey is a 69 y.o. female presenting on 06/03/2015 for Follow-up   See prior note for details. Pending f/u with neurosurgery for lumbar back surgery.  HTN - has not been taking losartan 50mg  daily over last several months. Does check blood pressures at home: 536U systolic.  No low blood pressure readings or symptoms of dizziness/syncope. Denies HA, vision changes, CP/tightness, SOB, leg swelling.   HLD - intolerant to several statins. Now on zetia 10mg  daily. Also on RYR, fish oil, flax seed oil, and colestipol. Has not been taking zetia or colestipol or fish oil. Not regular with RYR.   DM - regularly does not check sugars. Compliant with antihyperglycemic regimen which includes: diet controlled.  Denies low sugars or hypoglycemic symptoms.  Denies paresthesias. Last diabetic eye exam 08/2014.  Pneumovax: DUE.  Prevnar: 08/2014. Lab Results  Component Value Date   HGBA1C 6.5 05/29/2015   Diabetic Foot Exam - Simple   Simple Foot Form  Diabetic Foot exam was performed with the following findings:  Yes 06/03/2015  2:23 PM  Visual Inspection  No deformities, no ulcerations, no other skin breakdown bilaterally:  Yes  Sensation Testing  Intact to touch and monofilament testing bilaterally:  Yes  Pulse Check  Posterior Tibialis and Dorsalis pulse intact bilaterally:  Yes  Comments      Relevant past medical, surgical, family and social history reviewed and updated as indicated. Interim medical history since our last visit reviewed. Allergies and medications reviewed and updated. Current Outpatient Prescriptions on File Prior to Visit  Medication Sig  . Biotin 5000 MCG CAPS Take 1 capsule by mouth daily.  . cetirizine (ZYRTEC) 10 MG tablet Take 10 mg by  mouth daily.  . Coenzyme Q10 (CO Q 10) 100 MG CAPS Take 1 capsule by mouth daily.  . Fish Oil-Cholecalciferol (FISH OIL + D3) 1200-1000 MG-UNIT CAPS Take 1 capsule by mouth daily.  . Flaxseed, Linseed, (GNP FLAXSEED PO) Take 1 tablet by mouth daily.   Marland Kitchen gabapentin (NEURONTIN) 100 MG capsule Take 1 capsule (100 mg total) by mouth 2 (two) times daily.  Marland Kitchen HYDROcodone-acetaminophen (NORCO/VICODIN) 5-325 MG per tablet Take 1 tablet by mouth every 6 (six) hours as needed for moderate pain.  . hydroxypropyl methylcellulose (ISOPTO TEARS) 2.5 % ophthalmic solution Place 1 drop into both eyes 3 (three) times daily as needed (for dry eyes).  . Multiple Vitamin (MULTIVITAMIN) tablet Take 1 tablet by mouth daily.  Marland Kitchen omeprazole (PRILOSEC) 20 MG capsule TAKE ONE (1) CAPSULE BY MOUTH EACH DAY  . prednisoLONE acetate (PRED FORTE) 1 % ophthalmic suspension Place 1 drop into the right eye at bedtime.  . Red Yeast Rice 600 MG CAPS Take 1 capsule by mouth 2 (two) times daily.  . sertraline (ZOLOFT) 50 MG tablet TAKE ONE TABLET BY MOUTH ONCE DAILY  . valACYclovir (VALTREX) 1000 MG tablet Take 1,000 mg by mouth at bedtime.   . vitamin B-12 (CYANOCOBALAMIN) 1000 MCG tablet Take 1,000 mcg by mouth daily.  . colestipol (COLESTID) 1 G tablet Take 5 tablets with supper daily. (Patient not taking: Reported on 06/03/2015)   No current facility-administered medications on file prior to visit.  Review of Systems Per HPI unless specifically indicated above     Objective:    BP 148/62 mmHg  Pulse 84  Temp(Src) 98.2 F (36.8 C) (Oral)  Wt 128 lb (58.06 kg)  SpO2 98%  Wt Readings from Last 3 Encounters:  06/03/15 128 lb (58.06 kg)  05/29/15 129 lb (58.514 kg)  05/16/15 128 lb 4.8 oz (58.196 kg)    Physical Exam  Constitutional: She appears well-developed and well-nourished. No distress.  HENT:  Head: Normocephalic and atraumatic.  Right Ear: External ear normal.  Left Ear: External ear normal.  Nose: Nose  normal.  Mouth/Throat: Oropharynx is clear and moist. No oropharyngeal exudate.  Eyes: Conjunctivae and EOM are normal. Pupils are equal, round, and reactive to light. No scleral icterus.  Neck: Normal range of motion. Neck supple.  Cardiovascular: Normal rate, regular rhythm, normal heart sounds and intact distal pulses.   No murmur heard. Pulmonary/Chest: Effort normal and breath sounds normal. No respiratory distress. She has no wheezes. She has no rales.  Musculoskeletal: She exhibits no edema.  See HPI for foot exam if done  Lymphadenopathy:    She has no cervical adenopathy.  Skin: Skin is warm and dry. No rash noted.  Psychiatric: She has a normal mood and affect.  Nursing note and vitals reviewed.  Results for orders placed or performed in visit on 05/29/15  Hepatic function panel  Result Value Ref Range   Total Bilirubin 0.4 0.2 - 1.2 mg/dL   Bilirubin, Direct 0.1 0.0 - 0.3 mg/dL   Alkaline Phosphatase 49 39 - 117 U/L   AST 31 0 - 37 U/L   ALT 29 0 - 35 U/L   Total Protein 7.1 6.0 - 8.3 g/dL   Albumin 4.5 3.5 - 5.2 g/dL  Lipid panel  Result Value Ref Range   Cholesterol 293 (H) 0 - 200 mg/dL   Triglycerides 244.0 (H) 0.0 - 149.0 mg/dL   HDL 48.50 >39.00 mg/dL   VLDL 48.8 (H) 0.0 - 40.0 mg/dL   Total CHOL/HDL Ratio 6    NonHDL 244.37   Hemoglobin A1c  Result Value Ref Range   Hgb A1c MFr Bld 6.5 4.6 - 6.5 %  LDL cholesterol, direct  Result Value Ref Range   Direct LDL 191.0 mg/dL      Assessment & Plan:   Problem List Items Addressed This Visit    Diabetes type 2, controlled    Chronic, stable. Diet controlled. Continue.       Relevant Medications   losartan (COZAAR) 25 MG tablet   Essential hypertension    Chronic, elevated today but pt had not been taking losartan. Will restart 25mg  losartan. Pt agrees with plan.      Relevant Medications   losartan (COZAAR) 25 MG tablet   ezetimibe (ZETIA) 10 MG tablet   HLD (hyperlipidemia) - Primary    Had not  been taking meds, only fish oil. Encouraged restart RYR, zetia and colestipol. meds refilled.      Relevant Medications   losartan (COZAAR) 25 MG tablet   ezetimibe (ZETIA) 10 MG tablet       Follow up plan: Return in about 6 months (around 12/04/2015), or as needed, for medicare wellness.

## 2015-06-03 NOTE — Progress Notes (Signed)
Pre visit review using our clinic review tool, if applicable. No additional management support is needed unless otherwise documented below in the visit note. 

## 2015-06-03 NOTE — Assessment & Plan Note (Signed)
Had not been taking meds, only fish oil. Encouraged restart RYR, zetia and colestipol. meds refilled.

## 2015-06-03 NOTE — Assessment & Plan Note (Signed)
Chronic, stable. Diet controlled. Continue.

## 2015-06-03 NOTE — Assessment & Plan Note (Signed)
Chronic, elevated today but pt had not been taking losartan. Will restart 25mg  losartan. Pt agrees with plan.

## 2015-06-10 ENCOUNTER — Other Ambulatory Visit: Payer: Self-pay

## 2015-06-10 DIAGNOSIS — M5441 Lumbago with sciatica, right side: Secondary | ICD-10-CM

## 2015-06-10 NOTE — Telephone Encounter (Signed)
Pt left v/m requesting rx hydrocodone apap. Call when ready for pick up. rx last printed # 30 on 05/07/15 and last seen 06/03/15.

## 2015-06-11 MED ORDER — HYDROCODONE-ACETAMINOPHEN 5-325 MG PO TABS: 1.0000 | ORAL_TABLET | Freq: Four times a day (QID) | ORAL | Status: DC | PRN

## 2015-06-11 NOTE — Telephone Encounter (Signed)
Message left notifying patient and Rx placed up front for pick up. 

## 2015-06-11 NOTE — Telephone Encounter (Signed)
Printed and in Kim's box 

## 2015-06-24 ENCOUNTER — Inpatient Hospital Stay (HOSPITAL_COMMUNITY): Admission: RE | Admit: 2015-06-24 | Payer: PPO | Source: Ambulatory Visit

## 2015-06-25 ENCOUNTER — Encounter (HOSPITAL_COMMUNITY): Admission: RE | Payer: Self-pay | Source: Ambulatory Visit

## 2015-06-25 ENCOUNTER — Inpatient Hospital Stay (HOSPITAL_COMMUNITY): Admission: RE | Admit: 2015-06-25 | Payer: PPO | Source: Ambulatory Visit | Admitting: Neurosurgery

## 2015-06-25 SURGERY — POSTERIOR LUMBAR FUSION 1 LEVEL
Anesthesia: General

## 2015-07-07 ENCOUNTER — Other Ambulatory Visit: Payer: Self-pay

## 2015-07-07 DIAGNOSIS — Z1231 Encounter for screening mammogram for malignant neoplasm of breast: Secondary | ICD-10-CM

## 2015-07-22 ENCOUNTER — Other Ambulatory Visit: Payer: Self-pay

## 2015-07-22 DIAGNOSIS — M5441 Lumbago with sciatica, right side: Secondary | ICD-10-CM

## 2015-07-22 MED ORDER — HYDROCODONE-ACETAMINOPHEN 5-325 MG PO TABS: 1.0000 | ORAL_TABLET | Freq: Four times a day (QID) | ORAL | Status: DC | PRN

## 2015-07-22 NOTE — Telephone Encounter (Signed)
Printed and in Kim's box 

## 2015-07-22 NOTE — Telephone Encounter (Signed)
Pt left v/m requesting rx hydrocodone apap. Call when ready for pick up. Last seen 06/03/15 and rx last printed # 30 on 06/11/15 for back pain (pt decided not to have surgery).

## 2015-07-23 NOTE — Telephone Encounter (Signed)
Patient notified and Rx placed up front for pick up. 

## 2015-08-05 ENCOUNTER — Ambulatory Visit: Payer: PPO | Admitting: Family Medicine

## 2015-08-11 ENCOUNTER — Ambulatory Visit
Admission: RE | Admit: 2015-08-11 | Discharge: 2015-08-11 | Disposition: A | Discharge status: Home / Self Care | Payer: PPO | Source: Ambulatory Visit

## 2015-08-11 DIAGNOSIS — Z1231 Encounter for screening mammogram for malignant neoplasm of breast: Secondary | ICD-10-CM

## 2015-08-24 ENCOUNTER — Other Ambulatory Visit: Payer: Self-pay | Admitting: Family Medicine

## 2015-08-25 NOTE — Telephone Encounter (Signed)
printed and in Kim's box. 

## 2015-08-25 NOTE — Telephone Encounter (Signed)
Ok to refill 

## 2015-08-27 NOTE — Telephone Encounter (Signed)
Patient notified and Rx placed up front for pick up. 

## 2015-10-06 ENCOUNTER — Other Ambulatory Visit: Payer: Self-pay | Admitting: Family Medicine

## 2015-10-29 ENCOUNTER — Encounter: Payer: Self-pay | Admitting: Family Medicine

## 2015-10-29 ENCOUNTER — Ambulatory Visit (INDEPENDENT_AMBULATORY_CARE_PROVIDER_SITE_OTHER): Payer: PPO | Admitting: Family Medicine

## 2015-10-29 VITALS — BP 146/80 | HR 76 | Temp 98.1°F | Wt 131.5 lb

## 2015-10-29 DIAGNOSIS — M79604 Pain in right leg: Secondary | ICD-10-CM

## 2015-10-29 NOTE — Assessment & Plan Note (Signed)
New - different from prior HNP pain but possibly related. Does endorse twisted ankle injury 2 mo ago but exam today stable.  rec trial ankle brace and update if not improved. Pt agrees with plan.

## 2015-10-29 NOTE — Progress Notes (Signed)
BP 146/80 mmHg  Pulse 76  Temp(Src) 98.1 F (36.7 C) (Oral)  Wt 131 lb 8 oz (59.648 kg)   CC: R leg pain  Subjective:    Patient ID: Beth Bailey, female    DOB: 24-May-1946, 70 y.o.   MRN: [id number]  HPI: Beth Bailey is a 70 y.o. female presenting on 10/29/2015 for Leg Pain   See note 05/29/2015 for details. R leg pain described as tooth ache lateral leg and foot pain.  2 mo ago injured R lateral foot while walking - ?twisted lateral ankle.  This feels different from known HNP pain.  On hydrocodone for pain, slowly backing down on this. Last filled #30 08/25/2015.   H/o known HNP 2016 rec surgery, pt decided to wait and sxs have been stable. Some ongoing numbness of right foot.  MRI 04/2015 showed progressive disc protrusion and facet hypertrophy R>L with severe subarticular R>L stenosis (L3/4), superior disc extrusion vs free fragment R subarticular space leading to severe R subarticular stenosis and moderate central canal stenosis (L4/5), and some progressive moderate facet hypertrophy (L5/S1).  H/o L5/S1 laminectomy/decompression by Dr Christella Noa (2014)  Relevant past medical, surgical, family and social history reviewed and updated as indicated. Interim medical history since our last visit reviewed. Allergies and medications reviewed and updated. Current Outpatient Prescriptions on File Prior to Visit  Medication Sig  . Biotin 5000 MCG CAPS Take 5,000 mcg by mouth daily.   . cetirizine (ZYRTEC) 10 MG tablet Take 10 mg by mouth at bedtime.   . Coenzyme Q10 (CO Q 10) 100 MG CAPS Take 100 mg by mouth at bedtime.   Marland Kitchen ezetimibe (ZETIA) 10 MG tablet Take 1 tablet (10 mg total) by mouth daily.  Marland Kitchen gabapentin (NEURONTIN) 100 MG capsule Take 1 capsule (100 mg total) by mouth 2 (two) times daily.  Marland Kitchen HYDROcodone-acetaminophen (NORCO/VICODIN) 5-325 MG tablet TAKE ONE (1) TABLET BY MOUTH EVERY SIX HOURS AS NEEDED FOR MODERATE PAIN  . losartan (COZAAR) 25 MG tablet Take 1 tablet (25  mg total) by mouth daily.  . Multiple Vitamin (MULTIVITAMIN WITH MINERALS) TABS tablet Take 1 tablet by mouth daily.  . Omega-3 Fatty Acids (FISH OIL) 1200 MG CAPS Take 1,200 mg by mouth daily.  Marland Kitchen omeprazole (PRILOSEC) 20 MG capsule TAKE ONE (1) CAPSULE BY MOUTH EACH DAY  . OVER THE COUNTER MEDICATION Place 1 drop into both eyes 3 (three) times daily as needed (dry eyes). Over the counter "Eye Relief"  . prednisoLONE acetate (PRED FORTE) 1 % ophthalmic suspension Place 1 drop into the right eye at bedtime.  . sertraline (ZOLOFT) 50 MG tablet TAKE ONE TABLET BY MOUTH ONCE DAILY  . valACYclovir (VALTREX) 1000 MG tablet Take 1,000 mg by mouth at bedtime.   . vitamin B-12 (CYANOCOBALAMIN) 1000 MCG tablet Take 1,000 mcg by mouth daily.  . colestipol (COLESTID) 1 G tablet Take 5 tablets with supper daily. (Patient not taking: Reported on 10/29/2015)  . Red Yeast Rice 600 MG CAPS Take 1 capsule by mouth daily. Reported on 10/29/2015   No current facility-administered medications on file prior to visit.    Review of Systems Per HPI unless specifically indicated in ROS section     Objective:    BP 146/80 mmHg  Pulse 76  Temp(Src) 98.1 F (36.7 C) (Oral)  Wt 131 lb 8 oz (59.648 kg)  Wt Readings from Last 3 Encounters:  10/29/15 131 lb 8 oz (59.648 kg)  06/03/15 128 lb (58.06 kg)  05/29/15 129 lb (58.514 kg)    Physical Exam  Constitutional: She appears well-developed and well-nourished. No distress.  Musculoskeletal: She exhibits no edema.  2+ DP bilaterally. Neg calc squeeze test. No pain at malleoli. No ankle ligament laxity. No pedal edema No pain at midfoot No palpable cords  Skin: Skin is warm and dry. No rash noted.  Nursing note and vitals reviewed.     Assessment & Plan:   Problem List Items Addressed This Visit    Right leg pain - Primary    New - different from prior HNP pain but possibly related. Does endorse twisted ankle injury 2 mo ago but exam today stable.  rec  trial ankle brace and update if not improved. Pt agrees with plan.          Follow up plan: Return if symptoms worsen or fail to improve.

## 2015-10-29 NOTE — Patient Instructions (Addendum)
Foot and leg are looking ok today. Possibly still irritated from from twisted ankle a few months back - try ankle brace. If no improvement, pain possibly coming from back.  Try either lace up brace or slip on ankle brace.

## 2015-10-29 NOTE — Progress Notes (Signed)
Pre visit review using our clinic review tool, if applicable. No additional management support is needed unless otherwise documented below in the visit note. 

## 2015-11-05 ENCOUNTER — Other Ambulatory Visit: Payer: Self-pay | Admitting: Family Medicine

## 2015-11-07 ENCOUNTER — Other Ambulatory Visit: Payer: Self-pay | Admitting: Family Medicine

## 2015-11-10 ENCOUNTER — Encounter: Payer: Self-pay | Admitting: Internal Medicine

## 2015-11-10 NOTE — Telephone Encounter (Signed)
Printed and in Kim's box 

## 2015-11-10 NOTE — Telephone Encounter (Signed)
Patient notified and Rx placed up front for pick up. 

## 2015-12-22 ENCOUNTER — Other Ambulatory Visit: Payer: Self-pay | Admitting: Family Medicine

## 2015-12-23 NOTE — Telephone Encounter (Signed)
Message left advising patient and Rx placed up front for pick up. 

## 2015-12-23 NOTE — Telephone Encounter (Signed)
Ok to refill 

## 2015-12-23 NOTE — Telephone Encounter (Signed)
Printed and in Kim's box 

## 2015-12-25 DIAGNOSIS — L812 Freckles: Secondary | ICD-10-CM | POA: Diagnosis not present

## 2015-12-25 DIAGNOSIS — Z85820 Personal history of malignant melanoma of skin: Secondary | ICD-10-CM | POA: Diagnosis not present

## 2015-12-25 DIAGNOSIS — L821 Other seborrheic keratosis: Secondary | ICD-10-CM | POA: Diagnosis not present

## 2015-12-25 DIAGNOSIS — D1801 Hemangioma of skin and subcutaneous tissue: Secondary | ICD-10-CM | POA: Diagnosis not present

## 2016-01-04 ENCOUNTER — Encounter: Payer: Self-pay | Admitting: Family Medicine

## 2016-01-16 ENCOUNTER — Other Ambulatory Visit: Payer: Self-pay | Admitting: Family Medicine

## 2016-02-03 DIAGNOSIS — M5416 Radiculopathy, lumbar region: Secondary | ICD-10-CM | POA: Diagnosis not present

## 2016-02-03 DIAGNOSIS — M5126 Other intervertebral disc displacement, lumbar region: Secondary | ICD-10-CM | POA: Diagnosis not present

## 2016-02-03 DIAGNOSIS — M4806 Spinal stenosis, lumbar region: Secondary | ICD-10-CM | POA: Diagnosis not present

## 2016-02-08 ENCOUNTER — Other Ambulatory Visit: Payer: Self-pay | Admitting: Family Medicine

## 2016-02-22 ENCOUNTER — Other Ambulatory Visit: Payer: Self-pay | Admitting: Family Medicine

## 2016-03-10 ENCOUNTER — Ambulatory Visit: Payer: PPO

## 2016-04-05 DIAGNOSIS — M5416 Radiculopathy, lumbar region: Secondary | ICD-10-CM | POA: Diagnosis not present

## 2016-04-05 DIAGNOSIS — M4806 Spinal stenosis, lumbar region: Secondary | ICD-10-CM | POA: Diagnosis not present

## 2016-04-05 DIAGNOSIS — M5126 Other intervertebral disc displacement, lumbar region: Secondary | ICD-10-CM | POA: Diagnosis not present

## 2016-05-19 DIAGNOSIS — H04123 Dry eye syndrome of bilateral lacrimal glands: Secondary | ICD-10-CM | POA: Diagnosis not present

## 2016-05-19 DIAGNOSIS — Z961 Presence of intraocular lens: Secondary | ICD-10-CM | POA: Diagnosis not present

## 2016-05-19 DIAGNOSIS — H2512 Age-related nuclear cataract, left eye: Secondary | ICD-10-CM | POA: Diagnosis not present

## 2016-05-24 ENCOUNTER — Other Ambulatory Visit: Payer: PPO

## 2016-05-24 ENCOUNTER — Ambulatory Visit: Payer: PPO

## 2016-05-28 ENCOUNTER — Other Ambulatory Visit (INDEPENDENT_AMBULATORY_CARE_PROVIDER_SITE_OTHER): Payer: PPO

## 2016-05-28 ENCOUNTER — Ambulatory Visit (INDEPENDENT_AMBULATORY_CARE_PROVIDER_SITE_OTHER): Payer: PPO

## 2016-05-28 ENCOUNTER — Other Ambulatory Visit: Payer: Self-pay | Admitting: Family Medicine

## 2016-05-28 VITALS — BP 110/70 | HR 66 | Temp 98.5°F | Ht 58.5 in | Wt 126.2 lb

## 2016-05-28 DIAGNOSIS — Z0000 Encounter for general adult medical examination without abnormal findings: Secondary | ICD-10-CM | POA: Diagnosis not present

## 2016-05-28 DIAGNOSIS — K760 Fatty (change of) liver, not elsewhere classified: Secondary | ICD-10-CM | POA: Diagnosis not present

## 2016-05-28 DIAGNOSIS — E785 Hyperlipidemia, unspecified: Secondary | ICD-10-CM | POA: Diagnosis not present

## 2016-05-28 DIAGNOSIS — E119 Type 2 diabetes mellitus without complications: Secondary | ICD-10-CM | POA: Diagnosis not present

## 2016-05-28 DIAGNOSIS — I10 Essential (primary) hypertension: Secondary | ICD-10-CM

## 2016-05-28 DIAGNOSIS — R7989 Other specified abnormal findings of blood chemistry: Secondary | ICD-10-CM

## 2016-05-28 LAB — COMPREHENSIVE METABOLIC PANEL
ALBUMIN: 4.7 g/dL (ref 3.5–5.2)
ALK PHOS: 57 U/L (ref 39–117)
ALT: 25 U/L (ref 0–35)
AST: 27 U/L (ref 0–37)
BILIRUBIN TOTAL: 0.4 mg/dL (ref 0.2–1.2)
BUN: 15 mg/dL (ref 6–23)
CO2: 32 mEq/L (ref 19–32)
Calcium: 10.5 mg/dL (ref 8.4–10.5)
Chloride: 102 mEq/L (ref 96–112)
Creatinine, Ser: 0.78 mg/dL (ref 0.40–1.20)
GFR: 77.5 mL/min (ref >60.00)
GLUCOSE: 108 mg/dL — AB (ref 70–99)
Potassium: 5.4 mEq/L — ABNORMAL HIGH (ref 3.5–5.1)
Sodium: 141 mEq/L (ref 135–145)
TOTAL PROTEIN: 7.4 g/dL (ref 6.0–8.3)

## 2016-05-28 LAB — HEMOGLOBIN A1C: Hgb A1c MFr Bld: 6.6 % — ABNORMAL HIGH (ref 4.6–6.5)

## 2016-05-28 LAB — LIPID PANEL
Cholesterol: 333 mg/dL — ABNORMAL HIGH (ref 0–200)
HDL: 51.2 mg/dL (ref >39.00)
NONHDL: 281.62
TRIGLYCERIDES: 285 mg/dL — AB (ref 0.0–149.0)
Total CHOL/HDL Ratio: 7
VLDL: 57 mg/dL — ABNORMAL HIGH (ref 0.0–40.0)

## 2016-05-28 LAB — LDL CHOLESTEROL, DIRECT: Direct LDL: 222 mg/dL

## 2016-05-28 NOTE — Progress Notes (Signed)
Subjective:   Beth Bailey is a 70 y.o. female who presents for Medicare Annual (Subsequent) preventive examination.  Review of Systems:  N/A Cardiac Risk Factors include: advanced age (>37men, >61 women);diabetes mellitus;dyslipidemia;hypertension     Objective:     Vitals: BP 110/70 (BP Location: Left Arm, Patient Position: Sitting, Cuff Size: Normal)   Pulse 66   Temp 98.5 F (36.9 C) (Oral)   Ht 4' 10.5" (1.486 m) Comment: no shoes  Wt 126 lb 4 oz (57.3 kg)   SpO2 95%   BMI 25.94 kg/m   Body mass index is 25.94 kg/m.   Tobacco History  Smoking Status  . Never Smoker  Smokeless Tobacco  . Never Used     Counseling given: No   Past Medical History:  Diagnosis Date  . Allergic rhinitis    prior on allergy shots  . Anxiety   . Arthritis   . Colon polyps   . GERD (gastroesophageal reflux disease)    controlled with PPI  . Herpes simplex of eye    right - blind s/p corneal transplant  . History of colon polyps   . History of hiatal hernia   . History of melanoma    on back [name])  . HLD (hyperlipidemia)    intolerant of statins  . HNP (herniated nucleus pulposus), lumbar 12/07/12   s/p surgery  . Hyperglycemia    pt is unsure about this  . Hypertension    has taken herself off her medications   . Lesion of spleen 2013   2cm, stable on recheck  . NAFLD (nonalcoholic fatty liver disease) 12/2013   by [place]  . Osteopenia 2016   dexa T -1.5 hip  . Pneumonia    Past Surgical History:  Procedure Laterality Date  . ABDOMINAL HYSTERECTOMY  1985   ovaries remain.  endometriosis, abnormal pap as well  . CARPAL TUNNEL RELEASE Bilateral   . CATARACT EXTRACTION Right 2013  . CHOLECYSTECTOMY  2007  . COLONOSCOPY  10/2010   polyp , mild divertic, int hem, rec rpt 5 yrs Carlean Purl)  . CORNEAL TRANSPLANT Right 2012  . dexa  2016   osteopenia T-1.5  . LUMBAR LAMINECTOMY/DECOMPRESSION MICRODISCECTOMY Right 12/07/2012   Procedure: LUMBAR  LAMINECTOMY/DECOMPRESSION MICRODISCECTOMY 1 LEVEL;  Surgeon: Winfield Cunas, MD; Laterality: Right;  Right lumbar five-sacral one diskectomy  . NECK SURGERY  1990s   plate in neck  . TONSILLECTOMY     Family History  Problem Relation Age of Onset  . Alzheimer's disease Father   . Arthritis    . Diabetes Maternal Grandmother   . Dementia Maternal Grandmother   . Stroke Maternal Grandmother   . Parkinson's disease Maternal Grandfather   . Colon cancer Sister   . Cancer Maternal Uncle     questionable type  . Lung cancer Paternal Uncle   . Heart attack Maternal Uncle   . Kidney disease Maternal Uncle   . Liver disease Neg Hx    History  Sexual Activity  . Sexual activity: No    Outpatient Encounter Prescriptions as of 05/28/2016  Medication Sig  . Biotin 5000 MCG CAPS Take 5,000 mcg by mouth daily.   . cetirizine (ZYRTEC) 10 MG tablet Take 10 mg by mouth at bedtime.   . Coenzyme Q10 (CO Q 10) 100 MG CAPS Take 100 mg by mouth at bedtime.   . gabapentin (NEURONTIN) 100 MG capsule TAKE ONE CAPSULE BY MOUTH TWICE DAILY  . HYDROcodone-acetaminophen (NORCO/VICODIN) 5-325 MG  tablet TAKE 1 TABLET BY MOUTH EVERY 6 HOURS AS NEEDED FOR PAIN.  Marland Kitchen losartan (COZAAR) 25 MG tablet TAKE ONE TABLET BY MOUTH ONCE DAILY  . Multiple Vitamin (MULTIVITAMIN WITH MINERALS) TABS tablet Take 1 tablet by mouth daily.  . Omega-3 Fatty Acids (FISH OIL) 1200 MG CAPS Take 1,200 mg by mouth daily.  Marland Kitchen omeprazole (PRILOSEC) 20 MG capsule TAKE ONE CAPSULE BY MOUTH DAILY  . OVER THE COUNTER MEDICATION Place 1 drop into both eyes 3 (three) times daily as needed (dry eyes). Over the counter "Eye Relief"  . prednisoLONE acetate (PRED FORTE) 1 % ophthalmic suspension Place 1 drop into the right eye at bedtime.  . sertraline (ZOLOFT) 50 MG tablet TAKE ONE TABLET BY MOUTH ONCE DAILY  . valACYclovir (VALTREX) 1000 MG tablet Take 1,000 mg by mouth at bedtime.   . vitamin B-12 (CYANOCOBALAMIN) 1000 MCG tablet Take 1,000 mcg  by mouth daily.  . colestipol (COLESTID) 1 G tablet Take 5 tablets with supper daily. (Patient not taking: Reported on 10/29/2015)  . ezetimibe (ZETIA) 10 MG tablet Take 1 tablet (10 mg total) by mouth daily. (Patient not taking: Reported on 05/28/2016)  . Red Yeast Rice 600 MG CAPS Take 1 capsule by mouth daily. Reported on 10/29/2015  . [DISCONTINUED] gabapentin (NEURONTIN) 100 MG capsule Take 1 capsule (100 mg total) by mouth 2 (two) times daily.   No facility-administered encounter medications on file as of 05/28/2016.     Activities of Daily Living In your present state of health, do you have any difficulty performing the following activities: 05/28/2016  Hearing? N  Vision? N  Difficulty concentrating or making decisions? Y  Walking or climbing stairs? Y  Dressing or bathing? N  Doing errands, shopping? N  Preparing Food and eating ? N  Using the Toilet? N  In the past six months, have you accidently leaked urine? Y  Do you have problems with loss of bowel control? N  Managing your Medications? N  Managing your Finances? N  Housekeeping or managing your Housekeeping? 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 Increase water intake          Starting 05/28/2016, I will attempt to drink at least 6-8 glasses of water daily.       Fall Risk Fall Risk  05/28/2016 12/02/2014 09/25/2013  Falls in the past year? No No No   Depression  Screen PHQ 2/9 Scores 05/28/2016 12/02/2014 09/25/2013  PHQ - 2 Score 0 0 0     Cognitive Testing MMSE - Mini Mental State Exam 05/28/2016  Orientation to time 5  Orientation to Place 5  Registration 3  Attention/ Calculation 0  Recall 3  Language- name 2 objects 0  Language- repeat 1  Language- follow 3 step command 3  Language- read & follow direction  0  Write a sentence 0  Copy design 0  Total score 20   PLEASE NOTE: A Mini-Cog screen was completed. Maximum score is 20. A value of 0 denotes this part of Folstein MMSE was not completed or the patient failed this part of the Mini-Cog screening.   Mini-Cog Screening Orientation to Time - Max 5 pts Orientation to Place - Max 5 pts Registration - Max 3 pts Recall - Max 3 pts Language Repeat - Max 1 pts Language Follow 3 Step Command - Max 3 pts  Immunization History  Administered Date(s) Administered  . Pneumococcal Conjugate-13 08/27/2014   Screening Tests Health Maintenance  Topic Date Due  . COLONOSCOPY  10/10/2016 (Originally 10/16/2015)  . DTaP/Tdap/Td (1 - Tdap) 05/28/2017 (Originally 12/04/1964)  . ZOSTAVAX  05/28/2017 (Originally 12/04/2005)  . TETANUS/TDAP  05/28/2017 (Originally 12/04/1964)  . PNA vac Low Risk Adult (2 of 2 - PPSV23) 05/28/2017 (Originally 08/28/2015)  . INFLUENZA VACCINE  05/28/2029 (Originally 05/11/2016)  . FOOT EXAM  06/02/2016  . MAMMOGRAM  08/11/2016  . HEMOGLOBIN A1C  11/28/2016  . OPHTHALMOLOGY EXAM  05/18/2017  . DEXA SCAN  Completed  . Hepatitis C Screening  Completed      Plan:     I have personally reviewed and addressed the Medicare Annual Wellness questionnaire and have noted the following in the patient's chart:  A. Medical and social history B. Use of alcohol, tobacco or illicit drugs  C. Current medications and supplements D. Functional ability and status E.  Nutritional status F.  Physical activity G. Advance directives H. List of other physicians I.  Hospitalizations,  surgeries, and ER visits in previous 12 months J.  [place] to include hearing, vision, cognitive, depression L. Referrals and appointments - none  In addition, I have reviewed and discussed with patient certain preventive protocols, quality metrics, and best practice recommendations. A written personalized care plan for preventive services as well as general preventive health recommendations were provided to patient.  See attached scanned questionnaire for additional information.   Signed,   Lindell Noe, MHA, BS, LPN Health Advisor

## 2016-05-28 NOTE — Progress Notes (Signed)
PCP notes:   Health maintenance:  Tetanus - postponed/insurance Shingles - postponed/insurance PPSV23 - postponed/timing Colonoscopy - pt plans to schedule appt in future  Abnormal screenings:   None  Patient concerns:   None  Nurse concerns:  None  Next PCP appt:   05/31/2016 @ 1415

## 2016-05-28 NOTE — Patient Instructions (Addendum)
Ms. Empey , Thank you for taking time to come for your Medicare Wellness Visit. I appreciate your ongoing commitment to your health goals. Please review the following plan we discussed and let me know if I can assist you in the future.   These are the goals we discussed: Goals    . Increase water intake          Starting 05/28/2016, I will attempt to drink at least 6-8 glasses of water daily.        This is a list of the screening recommended for you and due dates:  Health Maintenance  Topic Date Due  . Colon Cancer Screening  10/10/2016*  . DTaP/Tdap/Td vaccine (1 - Tdap) 05/28/2017*  . Shingles Vaccine  05/28/2017*  . Tetanus Vaccine  05/28/2017*  . Pneumonia vaccines (2 of 2 - PPSV23) 05/28/2017*  . Flu Shot  05/28/2029*  . Complete foot exam   06/02/2016  . Mammogram  08/11/2016  . Hemoglobin A1C  11/28/2016  . Eye exam for diabetics  05/18/2017  . DEXA scan (bone density measurement)  Completed  .  Hepatitis C: One time screening is recommended by Center for Disease Control  (CDC) for  adults born from [birth year] through 1965.   Completed  *Topic was postponed. The date shown is not the original due date.    Preventive Care for Adults  A healthy lifestyle and preventive care can promote health and wellness. Preventive health guidelines for adults include the following key practices.  . A routine yearly physical is a good way to check with your health care provider about your health and preventive screening. It is a chance to share any concerns and updates on your health and to receive a thorough exam.  . Visit your dentist for a routine exam and preventive care every 6 months. Brush your teeth twice a day and floss once a day. Good oral hygiene prevents tooth decay and gum disease.  . The frequency of eye exams is based on your age, health, family medical history, use  of contact lenses, and other factors. Follow your health care provider's ecommendations for frequency of  eye exams.  . Eat a healthy diet. Foods like vegetables, fruits, whole grains, low-fat dairy products, and lean protein foods contain the nutrients you need without too many calories. Decrease your intake of foods high in solid fats, added sugars, and salt. Eat the right amount of calories for you. Get information about a proper diet from your health care provider, if necessary.  . Regular physical exercise is one of the most important things you can do for your health. Most adults should get at least 150 minutes of moderate-intensity exercise (any activity that increases your heart rate and causes you to sweat) each week. In addition, most adults need muscle-strengthening exercises on 2 or more days a week.  Silver Sneakers may be a benefit available to you. To determine eligibility, you may visit the website: www.silversneakers.com or contact program at 239 549 9901 Mon-Fri between 8AM-8PM.   . Maintain a healthy weight. The body mass index (BMI) is a screening tool to identify possible weight problems. It provides an estimate of body fat based on height and weight. Your health care provider can find your BMI and can help you achieve or maintain a healthy weight.   For adults 20 years and older: ? A BMI below 18.5 is considered underweight. ? A BMI of 18.5 to 24.9 is normal. ? A BMI of 25 to 29.9  is considered overweight. ? A BMI of 30 and above is considered obese.   . Maintain normal blood lipids and cholesterol levels by exercising and minimizing your intake of saturated fat. Eat a balanced diet with plenty of fruit and vegetables. Blood tests for lipids and cholesterol should begin at age 62 and be repeated every 5 years. If your lipid or cholesterol levels are high, you are over 50, or you are at high risk for heart disease, you may need your cholesterol levels checked more frequently. Ongoing high lipid and cholesterol levels should be treated with medicines if diet and exercise are not  working.  . If you smoke, find out from your health care provider how to quit. If you do not use tobacco, please do not start.  . If you choose to drink alcohol, please do not consume more than 2 drinks per day. One drink is considered to be 12 ounces (355 mL) of beer, 5 ounces (148 mL) of wine, or 1.5 ounces (44 mL) of liquor.  . If you are 91-58 years old, ask your health care provider if you should take aspirin to prevent strokes.  . Use sunscreen. Apply sunscreen liberally and repeatedly throughout the day. You should seek shade when your shadow is shorter than you. Protect yourself by wearing long sleeves, pants, a wide-brimmed hat, and sunglasses year round, whenever you are outdoors.  . Once a month, do a whole body skin exam, using a mirror to look at the skin on your back. Tell your health care provider of new moles, moles that have irregular borders, moles that are larger than a pencil eraser, or moles that have changed in shape or color.

## 2016-05-28 NOTE — Progress Notes (Signed)
Pre visit review using our clinic review tool, if applicable. No additional management support is needed unless otherwise documented below in the visit note. 

## 2016-05-29 NOTE — Progress Notes (Signed)
I reviewed health advisor's note, was available for consultation, and agree with documentation and plan.  

## 2016-05-31 ENCOUNTER — Ambulatory Visit (INDEPENDENT_AMBULATORY_CARE_PROVIDER_SITE_OTHER): Payer: PPO | Admitting: Family Medicine

## 2016-05-31 ENCOUNTER — Encounter: Payer: Self-pay | Admitting: Family Medicine

## 2016-05-31 VITALS — BP 118/64 | HR 68 | Temp 98.3°F | Ht 58.5 in | Wt 128.0 lb

## 2016-05-31 DIAGNOSIS — Z86010 Personal history of colonic polyps: Secondary | ICD-10-CM

## 2016-05-31 DIAGNOSIS — I10 Essential (primary) hypertension: Secondary | ICD-10-CM

## 2016-05-31 DIAGNOSIS — Z0000 Encounter for general adult medical examination without abnormal findings: Secondary | ICD-10-CM | POA: Insufficient documentation

## 2016-05-31 DIAGNOSIS — Z7189 Other specified counseling: Secondary | ICD-10-CM | POA: Diagnosis not present

## 2016-05-31 DIAGNOSIS — E7849 Other hyperlipidemia: Secondary | ICD-10-CM

## 2016-05-31 DIAGNOSIS — E875 Hyperkalemia: Secondary | ICD-10-CM

## 2016-05-31 DIAGNOSIS — E119 Type 2 diabetes mellitus without complications: Secondary | ICD-10-CM

## 2016-05-31 DIAGNOSIS — Z0001 Encounter for general adult medical examination with abnormal findings: Secondary | ICD-10-CM | POA: Insufficient documentation

## 2016-05-31 DIAGNOSIS — Z23 Encounter for immunization: Secondary | ICD-10-CM

## 2016-05-31 DIAGNOSIS — E785 Hyperlipidemia, unspecified: Secondary | ICD-10-CM

## 2016-05-31 DIAGNOSIS — M8580 Other specified disorders of bone density and structure, unspecified site: Secondary | ICD-10-CM

## 2016-05-31 MED ORDER — COLESEVELAM HCL 625 MG PO TABS: 625.0000 mg | ORAL_TABLET | Freq: Two times a day (BID) | ORAL | 6 refills | Status: DC

## 2016-05-31 NOTE — Progress Notes (Signed)
Pre visit review using our clinic review tool, if applicable. No additional management support is needed unless otherwise documented below in the visit note. 

## 2016-05-31 NOTE — Assessment & Plan Note (Signed)
Chronic, stable. Continue diet control.  

## 2016-05-31 NOTE — Assessment & Plan Note (Addendum)
Encouraged cal/vit D supplementation.

## 2016-05-31 NOTE — Progress Notes (Signed)
BP 118/64   Pulse 68   Temp 98.3 F (36.8 C) (Oral)   Ht 4' 10.5" (1.486 m)   Wt 128 lb (58.1 kg)   BMI 26.30 kg/m    CC: CPE Subjective:    Patient ID: Beth Bailey, female    DOB: Jul 23, 1946, 70 y.o.   MRN: [id number]  HPI: Beth Bailey is a 70 y.o. female presenting on 05/31/2016 for Annual Exam (Part 2)   Saw Beth Bailey last week for medicare wellness visit. Note reviewed.   HLD - did not tolerate colestipol. Could not afford zetia. Did not tolerate statins. On intermittent RYR.   Preventative: Mammogram 08/2015 normal.Breast exams at home.  Last colonoscopy 10/20/2010 - h/o polyps, rec rpt 5 yrs Beth Bailey).  Last well woman with prior PCP 2013, s/p hysterectomy 1985, ovaries remain. Dexa - 12/2014 osteopenia.  Flu - declines prevnar 08/2014. pneumovax - not due yet. H/o remote PNA infection. Tetanus - declines zostavax - declines Advanced directives: has this set up. Daughter Beth Bailey) is HCPOA. Asked to bring [place] copy. Seat belt use discussed Sunscreen use discussed. No changing moles on skin.   Lives with husband who she cares for (dementia), birds. Grown dogs. One daughter - neighbor Occupation: retired, worked at [employer]  Activity: no regular exercise.  Diet: fruits/vegetables daily, some water   Relevant past medical, surgical, family and social history reviewed and updated as indicated. Interim medical history since our last visit reviewed. Allergies and medications reviewed and updated. Current Outpatient Prescriptions on File Prior to Visit  Medication Sig  . Biotin 5000 MCG CAPS Take 10,000 mcg by mouth daily.   . cetirizine (ZYRTEC) 10 MG tablet Take 10 mg by mouth at bedtime.   . Coenzyme Q10 (CO Q 10) 100 MG CAPS Take 100 mg by mouth at bedtime.   . gabapentin (NEURONTIN) 100 MG capsule TAKE ONE CAPSULE BY MOUTH TWICE DAILY (Patient taking differently: TAKE TWO CAPSULE BY MOUTH TWICE DAILY)  . HYDROcodone-acetaminophen  (NORCO/VICODIN) 5-325 MG tablet TAKE 1 TABLET BY MOUTH EVERY 6 HOURS AS NEEDED FOR PAIN.  Marland Kitchen losartan (COZAAR) 25 MG tablet TAKE ONE TABLET BY MOUTH ONCE DAILY  . Multiple Vitamin (MULTIVITAMIN WITH MINERALS) TABS tablet Take 1 tablet by mouth daily.  . Omega-3 Fatty Acids (FISH OIL) 1200 MG CAPS Take 1,200 mg by mouth daily.  Marland Kitchen omeprazole (PRILOSEC) 20 MG capsule TAKE ONE CAPSULE BY MOUTH DAILY  . OVER THE COUNTER MEDICATION Place 1 drop into both eyes 3 (three) times daily as needed (dry eyes). Over the counter "Eye Relief"  . prednisoLONE acetate (PRED FORTE) 1 % ophthalmic suspension Place 1 drop into the right eye at bedtime.  . Red Yeast Rice 600 MG CAPS Take 1 capsule by mouth daily. Reported on 10/29/2015  . sertraline (ZOLOFT) 50 MG tablet TAKE ONE TABLET BY MOUTH ONCE DAILY  . valACYclovir (VALTREX) 1000 MG tablet Take 1,000 mg by mouth at bedtime.   . vitamin B-12 (CYANOCOBALAMIN) 1000 MCG tablet Take 1,000 mcg by mouth daily.   No current facility-administered medications on file prior to visit.     Review of Systems  Constitutional: Negative for activity change, appetite change, chills, fatigue, fever and unexpected weight change.  HENT: Negative for hearing loss.   Eyes: Negative for visual disturbance.  Respiratory: Negative for cough, chest tightness, shortness of breath and wheezing.   Cardiovascular: Negative for chest pain, palpitations and leg swelling.  Gastrointestinal: Negative for abdominal distention, abdominal pain,  blood in stool, constipation, diarrhea, nausea and vomiting.  Genitourinary: Negative for difficulty urinating and hematuria.  Musculoskeletal: Negative for arthralgias, myalgias and neck pain.  Skin: Negative for rash.  Neurological: Negative for dizziness, seizures, syncope and headaches.  Hematological: Negative for adenopathy. Does not bruise/bleed easily.  Psychiatric/Behavioral: Negative for dysphoric mood. The patient is not nervous/anxious.     Per HPI unless specifically indicated in ROS section     Objective:    BP 118/64   Pulse 68   Temp 98.3 F (36.8 C) (Oral)   Ht 4' 10.5" (1.486 m)   Wt 128 lb (58.1 kg)   BMI 26.30 kg/m   Wt Readings from Last 3 Encounters:  05/31/16 128 lb (58.1 kg)  05/28/16 126 lb 4 oz (57.3 kg)  10/29/15 131 lb 8 oz (59.6 kg)    Physical Exam  Constitutional: She is oriented to person, place, and time. She appears well-developed and well-nourished. No distress.  HENT:  Head: Normocephalic and atraumatic.  Right Ear: Hearing, tympanic membrane, external ear and ear canal normal.  Left Ear: Hearing, tympanic membrane, external ear and ear canal normal.  Nose: Nose normal.  Mouth/Throat: Uvula is midline, oropharynx is clear and moist and mucous membranes are normal. No oropharyngeal exudate, posterior oropharyngeal edema or posterior oropharyngeal erythema.  Eyes: Conjunctivae and EOM are normal. Pupils are equal, round, and reactive to light. No scleral icterus.  Neck: Normal range of motion. Neck supple. Carotid bruit is present (faint left bruit). No thyromegaly present.  Cardiovascular: Normal rate, regular rhythm, normal heart sounds and intact distal pulses.   No murmur heard. Pulses:      Radial pulses are 2+ on the right side, and 2+ on the left side.  Pulmonary/Chest: Effort normal and breath sounds normal. No respiratory distress. She has no wheezes. She has no rales.  Abdominal: Soft. Bowel sounds are normal. She exhibits no distension and no mass. There is no tenderness. There is no rebound and no guarding.  Musculoskeletal: Normal range of motion. She exhibits no edema.  Lymphadenopathy:    She has no cervical adenopathy.  Neurological: She is alert and oriented to person, place, and time.  CN grossly intact, station and gait intact  Skin: Skin is warm and dry. No rash noted.  Psychiatric: She has a normal mood and affect. Her behavior is normal. Judgment and thought content  normal.  Nursing note and vitals reviewed.  Results for orders placed or performed in visit on 05/28/16  Lipid panel  Result Value Ref Range   Cholesterol 333 (H) 0 - 200 mg/dL   Triglycerides 285.0 (H) 0.0 - 149.0 mg/dL   HDL 51.20 >39.00 mg/dL   VLDL 57.0 (H) 0.0 - 40.0 mg/dL   Total CHOL/HDL Ratio 7    NonHDL 281.62   Comprehensive metabolic panel  Result Value Ref Range   Sodium 141 135 - 145 mEq/L   Potassium 5.4 (H) 3.5 - 5.1 mEq/L   Chloride 102 96 - 112 mEq/L   CO2 32 19 - 32 mEq/L   Glucose, Bld 108 (H) 70 - 99 mg/dL   BUN 15 6 - 23 mg/dL   Creatinine, Ser 0.78 0.40 - 1.20 mg/dL   Total Bilirubin 0.4 0.2 - 1.2 mg/dL   Alkaline Phosphatase 57 39 - 117 U/L   AST 27 0 - 37 U/L   ALT 25 0 - 35 U/L   Total Protein 7.4 6.0 - 8.3 g/dL   Albumin 4.7 3.5 - 5.2  g/dL   Calcium 10.5 8.4 - 10.5 mg/dL   GFR 77.50 >60.00 mL/min  Hemoglobin A1c  Result Value Ref Range   Hgb A1c MFr Bld 6.6 (H) 4.6 - 6.5 %  LDL cholesterol, direct  Result Value Ref Range   Direct LDL 222.0 mg/dL      Assessment & Plan:   Problem List Items Addressed This Visit    Advanced care planning/counseling discussion    Advanced directives: has this set up. Daughter Beth Bailey) is HCPOA. Asked to bring [place] copy.      Relevant Orders   VAS US CAROTID   Diabetes type 2, controlled (HCC)    Chronic, stable. Continue diet control.      Essential hypertension    Chronic, stable on low dose losartan. Will hold for now given recent hyperkalemia, recheck K in 1 wk.      Relevant Medications   colesevelam (WELCHOL) 625 MG tablet   Familial hyperlipidemia, high LDL    Continue RYR. Will trial colesevelam. Consider lipid clinic referral.      Relevant Medications   colesevelam (WELCHOL) 625 MG tablet   Health maintenance examination - Primary    Preventative protocols reviewed and updated unless pt declined. Discussed healthy diet and lifestyle.       History of colon polyps     Encouraged she call to schedule f/u colonoscopy.      Osteopenia    Encouraged cal/vit D supplementation.       Other Visit Diagnoses    Hyperkalemia       Relevant Orders   Potassium       Follow up plan: Return in about 6 months (around 12/01/2016), or as needed, for follow up visit.  Ria Bush, MD

## 2016-05-31 NOTE — Assessment & Plan Note (Signed)
Encouraged she call to schedule f/u colonoscopy.

## 2016-05-31 NOTE — Assessment & Plan Note (Addendum)
Continue RYR. Will trial colesevelam. Consider lipid clinic referral.

## 2016-05-31 NOTE — Addendum Note (Signed)
Addended by: [name] C on: 05/31/2016 03:36 PM   Modules accepted: Orders

## 2016-05-31 NOTE — Assessment & Plan Note (Signed)
Advanced directives: has this set up. Daughter (Michelle Harris) is HCPOA. Asked to bring us copy. 

## 2016-05-31 NOTE — Assessment & Plan Note (Signed)
Preventative protocols reviewed and updated unless pt declined. Discussed healthy diet and lifestyle.  

## 2016-05-31 NOTE — Assessment & Plan Note (Addendum)
Chronic, stable on low dose losartan. Will hold for now given recent hyperkalemia, recheck K in 1 wk.

## 2016-05-31 NOTE — Patient Instructions (Addendum)
Pneumovax today.  Call Dr Celesta Aver office to schedule repeat colonoscopy.  Call your insurance about the shingles shot to see if it is covered or how much it would cost and where is cheaper (here or pharmacy).  If you want to receive here, call for nurse visit.  Bring [place] copy of your living will to update your chart. Pass by referral coordinators to schedule carotid ultrasound. Hold losartan for now, monitor blood pressure to ensure not elevated. Return in 1 week to recheck labs only.  Good to see today, call us with questions. Return in 6 months for follow up diabetes visit   Health Maintenance, Female Adopting a healthy lifestyle and getting preventive care can go a long way to promote health and wellness. Talk with your health care provider about what schedule of regular examinations is right for you. This is a good chance for you to check in with your provider about disease prevention and staying healthy. In between checkups, there are plenty of things you can do on your own. Experts have done a lot of research about which lifestyle changes and preventive measures are most likely to keep you healthy. Ask your health care provider for more information. WEIGHT AND DIET  Eat a healthy diet  Be sure to include plenty of vegetables, fruits, low-fat dairy products, and lean protein.  Do not eat a lot of foods high in solid fats, added sugars, or salt.  Get regular exercise. This is one of the most important things you can do for your health.  Most adults should exercise for at least 150 minutes each week. The exercise should increase your heart rate and make you sweat (moderate-intensity exercise).  Most adults should also do strengthening exercises at least twice a week. This is in addition to the moderate-intensity exercise.  Maintain a healthy weight  Body mass index (BMI) is a measurement that can be used to identify possible weight problems. It estimates body fat based on height and  weight. Your health care provider can help determine your BMI and help you achieve or maintain a healthy weight.  For females 36 years of age and older:   A BMI below 18.5 is considered underweight.  A BMI of 18.5 to 24.9 is normal.  A BMI of 25 to 29.9 is considered overweight.  A BMI of 30 and above is considered obese.  Watch levels of cholesterol and blood lipids  You should start having your blood tested for lipids and cholesterol at 70 years of age, then have this test every 5 years.  You may need to have your cholesterol levels checked more often if:  Your lipid or cholesterol levels are high.  You are older than 70 years of age.  You are at high risk for heart disease.  CANCER SCREENING   Lung Cancer  Lung cancer screening is recommended for adults 61-30 years old who are at high risk for lung cancer because of a history of smoking.  A yearly low-dose CT scan of the lungs is recommended for people who:  Currently smoke.  Have quit within the past 15 years.  Have at least a 30-pack-year history of smoking. A pack year is smoking an average of one pack of cigarettes a day for 1 year.  Yearly screening should continue until it has been 15 years since you quit.  Yearly screening should stop if you develop a health problem that would prevent you from having lung cancer treatment.  Breast Cancer  Practice  breast self-awareness. This means understanding how your breasts normally appear and feel.  It also means doing regular breast self-exams. Let your health care provider know about any changes, no matter how small.  If you are in your 20s or 30s, you should have a clinical breast exam (CBE) by a health care provider every 1-3 years as part of a regular health exam.  If you are 15 or older, have a CBE every year. Also consider having a breast X-ray (mammogram) every year.  If you have a family history of breast cancer, talk to your health care provider about  genetic screening.  If you are at high risk for breast cancer, talk to your health care provider about having an MRI and a mammogram every year.  Breast cancer gene (BRCA) assessment is recommended for women who have family members with BRCA-related cancers. BRCA-related cancers include:  Breast.  Ovarian.  Tubal.  Peritoneal cancers.  Results of the assessment will determine the need for genetic counseling and BRCA1 and BRCA2 testing. Cervical Cancer Your health care provider may recommend that you be screened regularly for cancer of the pelvic organs (ovaries, uterus, and vagina). This screening involves a pelvic examination, including checking for microscopic changes to the surface of your cervix (Pap test). You may be encouraged to have this screening done every 3 years, beginning at age 33.  For women ages 53-65, health care providers may recommend pelvic exams and Pap testing every 3 years, or they may recommend the Pap and pelvic exam, combined with testing for human papilloma virus (HPV), every 5 years. Some types of HPV increase your risk of cervical cancer. Testing for HPV may also be done on women of any age with unclear Pap test results.  Other health care providers may not recommend any screening for nonpregnant women who are considered low risk for pelvic cancer and who do not have symptoms. Ask your health care provider if a screening pelvic exam is right for you.  If you have had past treatment for cervical cancer or a condition that could lead to cancer, you need Pap tests and screening for cancer for at least 20 years after your treatment. If Pap tests have been discontinued, your risk factors (such as having a new sexual partner) need to be reassessed to determine if screening should resume. Some women have medical problems that increase the chance of getting cervical cancer. In these cases, your health care provider may recommend more frequent screening and Pap  tests. Colorectal Cancer  This type of cancer can be detected and often prevented.  Routine colorectal cancer screening usually begins at 70 years of age and continues through 70 years of age.  Your health care provider may recommend screening at an earlier age if you have risk factors for colon cancer.  Your health care provider may also recommend using home test kits to check for hidden blood in the stool.  A small camera at the end of a tube can be used to examine your colon directly (sigmoidoscopy or colonoscopy). This is done to check for the earliest forms of colorectal cancer.  Routine screening usually begins at age 32.  Direct examination of the colon should be repeated every 5-10 years through 70 years of age. However, you may need to be screened more often if early forms of precancerous polyps or small growths are found. Skin Cancer  Check your skin from head to toe regularly.  Tell your health care provider about any new  moles or changes in moles, especially if there is a change in a mole's shape or color.  Also tell your health care provider if you have a mole that is larger than the size of a pencil eraser.  Always use sunscreen. Apply sunscreen liberally and repeatedly throughout the day.  Protect yourself by wearing long sleeves, pants, a wide-brimmed hat, and sunglasses whenever you are outside. HEART DISEASE, DIABETES, AND HIGH BLOOD PRESSURE   High blood pressure causes heart disease and increases the risk of stroke. High blood pressure is more likely to develop in:  People who have blood pressure in the high end of the normal range (130-139/85-89 mm Hg).  People who are overweight or obese.  People who are African American.  If you are 42-37 years of age, have your blood pressure checked every 3-5 years. If you are 46 years of age or older, have your blood pressure checked every year. You should have your blood pressure measured twice--once when you are at a  hospital or clinic, and once when you are not at a hospital or clinic. Record the average of the two measurements. To check your blood pressure when you are not at a hospital or clinic, you can use:  An automated blood pressure machine at a pharmacy.  A home blood pressure monitor.  If you are between 51 years and 51 years old, ask your health care provider if you should take aspirin to prevent strokes.  Have regular diabetes screenings. This involves taking a blood sample to check your fasting blood sugar level.  If you are at a normal weight and have a low risk for diabetes, have this test once every three years after 70 years of age.  If you are overweight and have a high risk for diabetes, consider being tested at a younger age or more often. PREVENTING INFECTION  Hepatitis B  If you have a higher risk for hepatitis B, you should be screened for this virus. You are considered at high risk for hepatitis B if:  You were born in a country where hepatitis B is common. Ask your health care provider which countries are considered high risk.  Your parents were born in a high-risk country, and you have not been immunized against hepatitis B (hepatitis B vaccine).  You have HIV or AIDS.  You use needles to inject street drugs.  You live with someone who has hepatitis B.  You have had sex with someone who has hepatitis B.  You get hemodialysis treatment.  You take certain medicines for conditions, including cancer, organ transplantation, and autoimmune conditions. Hepatitis C  Blood testing is recommended for:  Everyone born from [birth year] through 1965.  Anyone with known risk factors for hepatitis C. Sexually transmitted infections (STIs)  You should be screened for sexually transmitted infections (STIs) including gonorrhea and chlamydia if:  You are sexually active and are younger than 70 years of age.  You are older than 70 years of age and your health care provider tells you  that you are at risk for this type of infection.  Your sexual activity has changed since you were last screened and you are at an increased risk for chlamydia or gonorrhea. Ask your health care provider if you are at risk.  If you do not have HIV, but are at risk, it may be recommended that you take a prescription medicine daily to prevent HIV infection. This is called pre-exposure prophylaxis (PrEP). You are considered at risk if:  You are sexually active and do not regularly use condoms or know the HIV status of your partner(s).  You take drugs by injection.  You are sexually active with a partner who has HIV. Talk with your health care provider about whether you are at high risk of being infected with HIV. If you choose to begin PrEP, you should first be tested for HIV. You should then be tested every 3 months for as long as you are taking PrEP.  PREGNANCY   If you are premenopausal and you may become pregnant, ask your health care provider about preconception counseling.  If you may become pregnant, take 400 to 800 micrograms (mcg) of folic acid every day.  If you want to prevent pregnancy, talk to your health care provider about birth control (contraception). OSTEOPOROSIS AND MENOPAUSE   Osteoporosis is a disease in which the bones lose minerals and strength with aging. This can result in serious bone fractures. Your risk for osteoporosis can be identified using a bone density scan.  If you are 80 years of age or older, or if you are at risk for osteoporosis and fractures, ask your health care provider if you should be screened.  Ask your health care provider whether you should take a calcium or vitamin D supplement to lower your risk for osteoporosis.  Menopause may have certain physical symptoms and risks.  Hormone replacement therapy may reduce some of these symptoms and risks. Talk to your health care provider about whether hormone replacement therapy is right for you.  HOME  CARE INSTRUCTIONS   Schedule regular health, dental, and eye exams.  Stay current with your immunizations.   Do not use any tobacco products including cigarettes, chewing tobacco, or electronic cigarettes.  If you are pregnant, do not drink alcohol.  If you are breastfeeding, limit how much and how often you drink alcohol.  Limit alcohol intake to no more than 1 drink per day for nonpregnant women. One drink equals 12 ounces of beer, 5 ounces of wine, or 1 ounces of hard liquor.  Do not use street drugs.  Do not share needles.  Ask your health care provider for help if you need support or information about quitting drugs.  Tell your health care provider if you often feel depressed.  Tell your health care provider if you have ever been abused or do not feel safe at home.   This information is not intended to replace advice given to you by your health care provider. Make sure you discuss any questions you have with your health care provider.   Document Released: 04/12/2011 Document Revised: 10/18/2014 Document Reviewed: 08/29/2013 Elsevier Interactive Patient Education [employer].

## 2016-06-07 ENCOUNTER — Other Ambulatory Visit (INDEPENDENT_AMBULATORY_CARE_PROVIDER_SITE_OTHER): Payer: PPO

## 2016-06-07 DIAGNOSIS — E875 Hyperkalemia: Secondary | ICD-10-CM | POA: Diagnosis not present

## 2016-06-07 LAB — POTASSIUM: POTASSIUM: 4.8 meq/L (ref 3.5–5.1)

## 2016-06-08 ENCOUNTER — Other Ambulatory Visit: Payer: Self-pay | Admitting: Family Medicine

## 2016-06-10 ENCOUNTER — Other Ambulatory Visit: Payer: Self-pay | Admitting: Family Medicine

## 2016-06-10 DIAGNOSIS — R0989 Other specified symptoms and signs involving the circulatory and respiratory systems: Secondary | ICD-10-CM

## 2016-06-16 ENCOUNTER — Ambulatory Visit: Payer: PPO

## 2016-06-16 DIAGNOSIS — I6523 Occlusion and stenosis of bilateral carotid arteries: Secondary | ICD-10-CM | POA: Diagnosis not present

## 2016-06-16 DIAGNOSIS — R0989 Other specified symptoms and signs involving the circulatory and respiratory systems: Secondary | ICD-10-CM

## 2016-06-19 ENCOUNTER — Encounter: Payer: Self-pay | Admitting: Family Medicine

## 2016-06-19 DIAGNOSIS — I6529 Occlusion and stenosis of unspecified carotid artery: Secondary | ICD-10-CM | POA: Insufficient documentation

## 2016-06-19 HISTORY — DX: Occlusion and stenosis of unspecified carotid artery: I65.29

## 2016-06-21 ENCOUNTER — Encounter: Payer: Self-pay | Admitting: *Deleted

## 2016-07-07 IMAGING — CR DG LUMBAR SPINE COMPLETE 4+V
1 series · 1 of 1 positions shown · non-contrast
Comparison: MRI lumbar spine reported separately.

CLINICAL DATA: RIGHT leg pain. Symptoms for 2 days. Fell 1 year
ago.

EXAM:
LUMBAR SPINE - COMPLETE 4+ VIEW

[dg lumbar spine complete 4 +v]
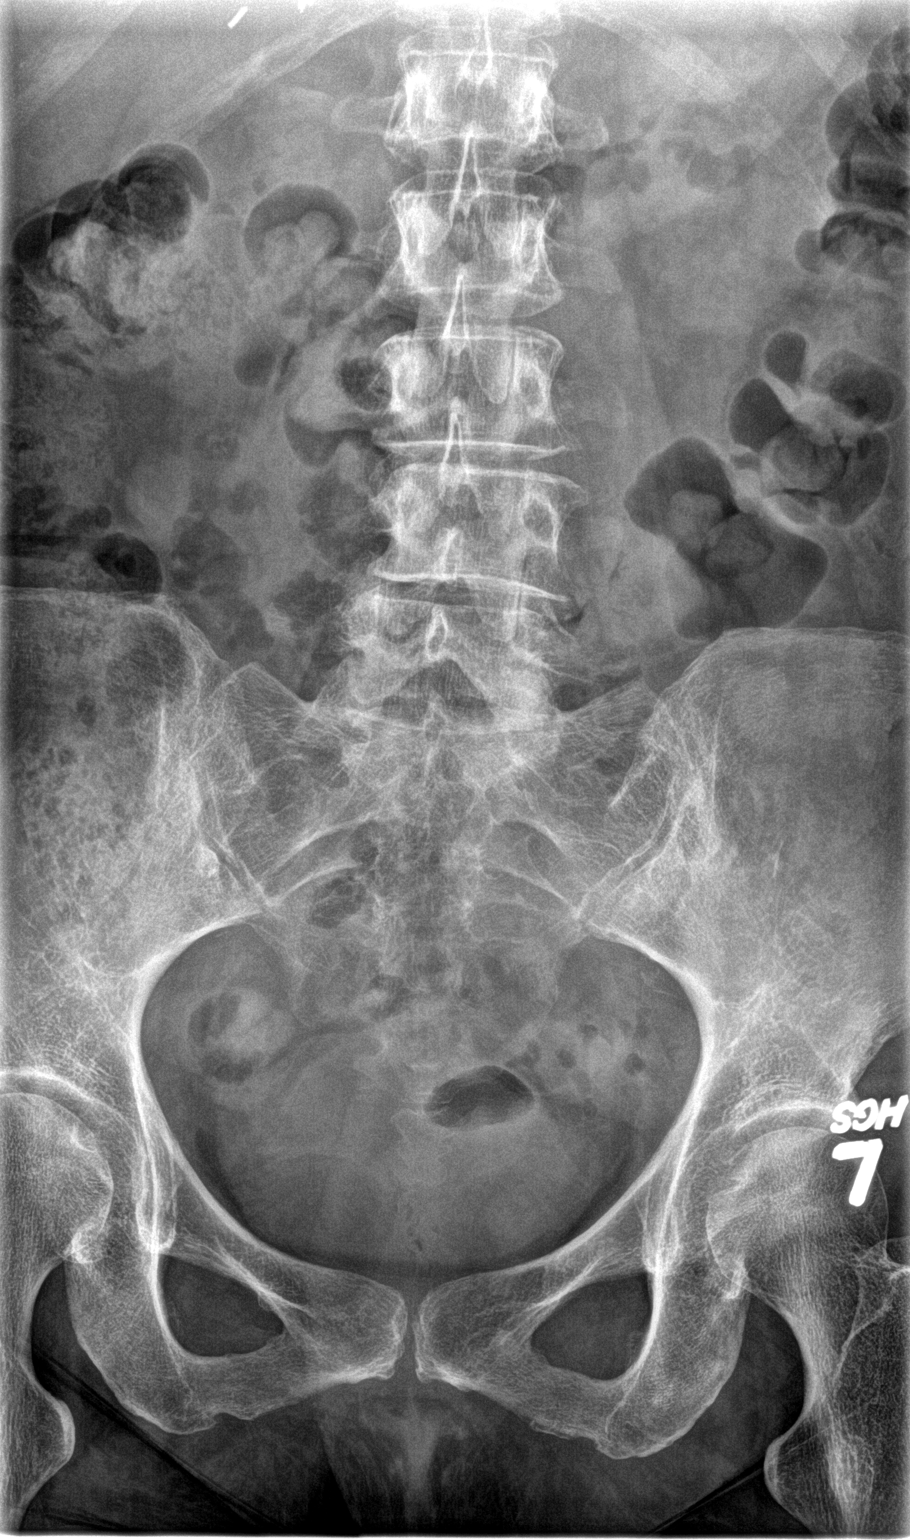

[1 of 1 positions shown; findings below may reference images not displayed]

FINDINGS: Disc space narrowing at L3-4, L4-5, and L5-S1. No lumbar compression
deformity. 5 mm anterolisthesis L4-5 is facet mediated. Slight
degenerative scoliosis convex LEFT lower lumbar region. Lower lumbar
facet arthropathy without pars defects. Cholecystectomy.
IMPRESSION: Degenerative changes as described. 5 mm anterolisthesis L4-5
contributes to stenosis on MR. Consider standing flexion extension
lumbar radiographs to evaluate for dynamic instability.

## 2016-07-14 ENCOUNTER — Other Ambulatory Visit: Payer: Self-pay | Admitting: Family Medicine

## 2016-07-14 DIAGNOSIS — Z1231 Encounter for screening mammogram for malignant neoplasm of breast: Secondary | ICD-10-CM

## 2016-07-22 DIAGNOSIS — Z85820 Personal history of malignant melanoma of skin: Secondary | ICD-10-CM | POA: Diagnosis not present

## 2016-07-22 DIAGNOSIS — L821 Other seborrheic keratosis: Secondary | ICD-10-CM | POA: Diagnosis not present

## 2016-07-22 DIAGNOSIS — L814 Other melanin hyperpigmentation: Secondary | ICD-10-CM | POA: Diagnosis not present

## 2016-07-22 DIAGNOSIS — D235 Other benign neoplasm of skin of trunk: Secondary | ICD-10-CM | POA: Diagnosis not present

## 2016-08-11 ENCOUNTER — Ambulatory Visit: Payer: PPO

## 2016-08-24 DIAGNOSIS — M5416 Radiculopathy, lumbar region: Secondary | ICD-10-CM | POA: Diagnosis not present

## 2016-08-24 DIAGNOSIS — M48062 Spinal stenosis, lumbar region with neurogenic claudication: Secondary | ICD-10-CM | POA: Diagnosis not present

## 2016-08-24 DIAGNOSIS — M5126 Other intervertebral disc displacement, lumbar region: Secondary | ICD-10-CM | POA: Diagnosis not present

## 2016-09-08 ENCOUNTER — Ambulatory Visit (INDEPENDENT_AMBULATORY_CARE_PROVIDER_SITE_OTHER): Payer: PPO | Admitting: Family Medicine

## 2016-09-08 ENCOUNTER — Encounter: Payer: Self-pay | Admitting: Family Medicine

## 2016-09-08 VITALS — BP 120/70 | HR 78 | Temp 98.5°F | Wt 125.0 lb

## 2016-09-08 DIAGNOSIS — J069 Acute upper respiratory infection, unspecified: Secondary | ICD-10-CM

## 2016-09-08 DIAGNOSIS — B9789 Other viral agents as the cause of diseases classified elsewhere: Secondary | ICD-10-CM

## 2016-09-08 NOTE — Patient Instructions (Addendum)
You have a viral upper respiratory infection. Antibiotics are not needed for this.  Viral infections usually take 7-10 days to resolve.  The cough can last a few weeks to go away. May take mucinex and over the counter cough suppressant like delsym or dimetapp.  Push fluids and plenty of rest. Please return if you are not improving as expected, or if you have high fevers (>101.5) or difficulty swallowing or worsening productive cough. Call clinic with questions.  Good to see you today. I hope you start feeling better soon.

## 2016-09-08 NOTE — Progress Notes (Signed)
BP 120/70   Pulse 78   Temp 98.5 F (36.9 C) (Oral)   Wt 125 lb (56.7 kg)   SpO2 96%   BMI 25.68 kg/m    CC: cough Subjective:    Patient ID: Beth Bailey, female    DOB: January 18, 1946, 70 y.o.   MRN: [id number]  HPI: Beth Bailey is a 70 y.o. female presenting on 09/08/2016 for Cough   1 wk h/o congestion, with non productive cough. Feels throat pain with coughing.   Denies fevers/chills, ear or tooth pain, body aches, ST.  + sick contact at home.  mucinex has helped.  No asthma.  Not around smokers.   Relevant past medical, surgical, family and social history reviewed and updated as indicated. Interim medical history since our last visit reviewed. Allergies and medications reviewed and updated. Current Outpatient Prescriptions on File Prior to Visit  Medication Sig  . Biotin 5000 MCG CAPS Take 10,000 mcg by mouth daily.   . cetirizine (ZYRTEC) 10 MG tablet Take 10 mg by mouth at bedtime.   . gabapentin (NEURONTIN) 100 MG capsule TAKE ONE CAPSULE BY MOUTH TWICE DAILY (Patient taking differently: TAKE TWO CAPSULE BY MOUTH TWICE DAILY)  . HYDROcodone-acetaminophen (NORCO/VICODIN) 5-325 MG tablet TAKE 1 TABLET BY MOUTH EVERY 6 HOURS AS NEEDED FOR PAIN.  . Multiple Vitamin (MULTIVITAMIN WITH MINERALS) TABS tablet Take 1 tablet by mouth daily.  . Omega-3 Fatty Acids (FISH OIL) 1200 MG CAPS Take 1,200 mg by mouth daily.  Marland Kitchen omeprazole (PRILOSEC) 20 MG capsule TAKE ONE CAPSULE BY MOUTH DAILY  . OVER THE COUNTER MEDICATION Place 1 drop into both eyes 3 (three) times daily as needed (dry eyes). Over the counter "Eye Relief"  . prednisoLONE acetate (PRED FORTE) 1 % ophthalmic suspension Place 1 drop into the right eye at bedtime.  . Red Yeast Rice 600 MG CAPS Take 1 capsule by mouth daily. Reported on 10/29/2015  . sertraline (ZOLOFT) 50 MG tablet TAKE ONE TABLET BY MOUTH ONCE DAILY  . valACYclovir (VALTREX) 1000 MG tablet Take 1,000 mg by mouth at bedtime.   . vitamin B-12  (CYANOCOBALAMIN) 1000 MCG tablet Take 1,000 mcg by mouth daily.   No current facility-administered medications on file prior to visit.     Review of Systems Per HPI unless specifically indicated in ROS section     Objective:    BP 120/70   Pulse 78   Temp 98.5 F (36.9 C) (Oral)   Wt 125 lb (56.7 kg)   SpO2 96%   BMI 25.68 kg/m   Wt Readings from Last 3 Encounters:  09/08/16 125 lb (56.7 kg)  05/31/16 128 lb (58.1 kg)  05/28/16 126 lb 4 oz (57.3 kg)    Physical Exam  Constitutional: She appears well-developed and well-nourished. No distress.  HENT:  Head: Normocephalic and atraumatic.  Right Ear: Hearing, tympanic membrane, external ear and ear canal normal.  Left Ear: Hearing, tympanic membrane, external ear and ear canal normal.  Nose: No mucosal edema (nasal mucosal injection) or rhinorrhea. Right sinus exhibits no maxillary sinus tenderness and no frontal sinus tenderness. Left sinus exhibits no maxillary sinus tenderness and no frontal sinus tenderness.  Mouth/Throat: Uvula is midline, oropharynx is clear and moist and mucous membranes are normal. No oropharyngeal exudate, posterior oropharyngeal edema, posterior oropharyngeal erythema or tonsillar abscesses.  Eyes: Conjunctivae and EOM are normal. Pupils are equal, round, and reactive to light. No scleral icterus.  Neck: Normal range of motion. Neck supple.  Cardiovascular: Normal rate, regular rhythm, normal heart sounds and intact distal pulses.   No murmur heard. Pulmonary/Chest: Effort normal and breath sounds normal. No respiratory distress. She has no wheezes. She has no rales.  Lymphadenopathy:    She has no cervical adenopathy.  Skin: Skin is warm and dry. No rash noted.  Nursing note and vitals reviewed.     Assessment & Plan:   Problem List Items Addressed This Visit    Viral URI with cough - Primary    Anticipate viral given short duration and benign exam today. Supportive care discussed. Red flags to  return discussed.          Follow up plan: Return if symptoms worsen or fail to improve.  Ria Bush, MD

## 2016-09-08 NOTE — Assessment & Plan Note (Signed)
Anticipate viral given short duration and benign exam today. Supportive care discussed. Red flags to return discussed.

## 2016-09-08 NOTE — Progress Notes (Signed)
Pre visit review using our clinic review tool, if applicable. No additional management support is needed unless otherwise documented below in the visit note. 

## 2016-09-14 ENCOUNTER — Ambulatory Visit
Admission: RE | Admit: 2016-09-14 | Discharge: 2016-09-14 | Disposition: A | Discharge status: Home / Self Care | Payer: PPO | Source: Ambulatory Visit | Attending: Family Medicine | Admitting: Family Medicine

## 2016-09-14 DIAGNOSIS — Z1231 Encounter for screening mammogram for malignant neoplasm of breast: Secondary | ICD-10-CM | POA: Diagnosis not present

## 2016-09-14 LAB — HM MAMMOGRAPHY

## 2016-09-15 ENCOUNTER — Encounter: Payer: Self-pay | Admitting: *Deleted

## 2016-10-18 ENCOUNTER — Other Ambulatory Visit: Payer: Self-pay | Admitting: Family Medicine

## 2016-10-18 NOTE — Telephone Encounter (Signed)
Last refill 10/07/15 #30 +11, had cpe on 05/31/16.

## 2016-11-04 ENCOUNTER — Encounter: Payer: Self-pay | Admitting: Internal Medicine

## 2016-11-19 DIAGNOSIS — Z961 Presence of intraocular lens: Secondary | ICD-10-CM | POA: Diagnosis not present

## 2016-11-19 DIAGNOSIS — T85398D Other mechanical complication of other ocular prosthetic devices, implants and grafts, subsequent encounter: Secondary | ICD-10-CM | POA: Diagnosis not present

## 2016-11-19 DIAGNOSIS — H2512 Age-related nuclear cataract, left eye: Secondary | ICD-10-CM | POA: Diagnosis not present

## 2016-11-19 LAB — HM DIABETES EYE EXAM

## 2016-11-30 DIAGNOSIS — M48062 Spinal stenosis, lumbar region with neurogenic claudication: Secondary | ICD-10-CM | POA: Diagnosis not present

## 2016-11-30 DIAGNOSIS — M5126 Other intervertebral disc displacement, lumbar region: Secondary | ICD-10-CM | POA: Diagnosis not present

## 2016-11-30 DIAGNOSIS — M5416 Radiculopathy, lumbar region: Secondary | ICD-10-CM | POA: Diagnosis not present

## 2016-12-01 ENCOUNTER — Other Ambulatory Visit: Payer: Self-pay | Admitting: Family Medicine

## 2016-12-02 ENCOUNTER — Ambulatory Visit (INDEPENDENT_AMBULATORY_CARE_PROVIDER_SITE_OTHER): Payer: PPO | Admitting: Family Medicine

## 2016-12-02 ENCOUNTER — Encounter: Payer: Self-pay | Admitting: Family Medicine

## 2016-12-02 VITALS — BP 136/68 | HR 72 | Temp 98.6°F | Wt 128.5 lb

## 2016-12-02 DIAGNOSIS — E784 Other hyperlipidemia: Secondary | ICD-10-CM

## 2016-12-02 DIAGNOSIS — I10 Essential (primary) hypertension: Secondary | ICD-10-CM | POA: Diagnosis not present

## 2016-12-02 DIAGNOSIS — K1370 Unspecified lesions of oral mucosa: Secondary | ICD-10-CM

## 2016-12-02 DIAGNOSIS — E119 Type 2 diabetes mellitus without complications: Secondary | ICD-10-CM | POA: Diagnosis not present

## 2016-12-02 DIAGNOSIS — E7849 Other hyperlipidemia: Secondary | ICD-10-CM

## 2016-12-02 NOTE — Progress Notes (Signed)
BP 136/68   Pulse 72   Temp 98.6 F (37 C) (Oral)   Wt 128 lb 8 oz (58.3 kg)   BMI 26.40 kg/m    CC: 75mo f/u visit Subjective:    Patient ID: Beth Bailey, female    DOB: 1946-01-11, 71 y.o.   MRN: [id number]  HPI: Beth Bailey is a 71 y.o. female presenting on 12/02/2016 for Follow-up   HLD - did not tolerate colestipol. Could not afford zetia. Did not tolerate statins. On intermittent RYR - has not been taking. Last visit we trialed colesevelam - caused body aches, discussed possible referral to lipid clinic.   HTN - prior on losartan 25mg  daily, this has fallen off med list. Will need to verify she continues taking. Does not check blood pressures at home. No low blood pressure readings or symptoms of dizziness/syncope. Denies HA, vision changes, CP/tightness, SOB, leg swelling.   DM - regularly does not check sugars.  Compliant with antihyperglycemic regimen which includes: diet controlled. Denies low sugars or hypoglycemic symptoms. Denies paresthesias. Last diabetic eye exam: last week - pt states no diabetes damage. Pneumovax: 2017.  Prevnar: 2015. Lab Results  Component Value Date   HGBA1C 6.6 (H) 05/28/2016   Diabetic Foot Exam - Simple   Simple Foot Form Diabetic Foot exam was performed with the following findings:  Yes 12/02/2016 12:52 PM  Visual Inspection No deformities, no ulcerations, no other skin breakdown bilaterally:  Yes Sensation Testing Intact to touch and monofilament testing bilaterally:  Yes Pulse Check Posterior Tibialis and Dorsalis pulse intact bilaterally:  Yes Comments      Relevant past medical, surgical, family and social history reviewed and updated as indicated. Interim medical history since our last visit reviewed. Allergies and medications reviewed and updated. Outpatient Medications Prior to Visit  Medication Sig Dispense Refill  . Biotin 5000 MCG CAPS Take 10,000 mcg by mouth daily.     . cetirizine (ZYRTEC) 10 MG tablet Take 10  mg by mouth at bedtime.     Marland Kitchen HYDROcodone-acetaminophen (NORCO/VICODIN) 5-325 MG tablet TAKE 1 TABLET BY MOUTH EVERY 6 HOURS AS NEEDED FOR PAIN. 30 tablet 0  . Multiple Vitamin (MULTIVITAMIN WITH MINERALS) TABS tablet Take 1 tablet by mouth daily.    . Omega-3 Fatty Acids (FISH OIL) 1200 MG CAPS Take 1,200 mg by mouth daily.    Marland Kitchen OVER THE COUNTER MEDICATION Place 1 drop into both eyes 3 (three) times daily as needed (dry eyes). Over the counter "Eye Relief"    . prednisoLONE acetate (PRED FORTE) 1 % ophthalmic suspension Place 1 drop into the right eye at bedtime.    . sertraline (ZOLOFT) 50 MG tablet TAKE ONE TABLET BY MOUTH ONCE DAILY 90 tablet 1  . valACYclovir (VALTREX) 1000 MG tablet Take 1,000 mg by mouth at bedtime.     . vitamin B-12 (CYANOCOBALAMIN) 1000 MCG tablet Take 1,000 mcg by mouth daily.    Marland Kitchen omeprazole (PRILOSEC) 20 MG capsule TAKE ONE CAPSULE BY MOUTH DAILY 30 capsule 6  . Red Yeast Rice 600 MG CAPS Take 1 capsule by mouth 2 (two) times daily. Reported on 10/29/2015    . gabapentin (NEURONTIN) 100 MG capsule TAKE ONE CAPSULE BY MOUTH TWICE DAILY (Patient taking differently: TAKE TWO CAPSULE BY MOUTH TWICE DAILY) 60 capsule 6   No facility-administered medications prior to visit.      Per HPI unless specifically indicated in ROS section below Review of Systems     Objective:  BP 136/68   Pulse 72   Temp 98.6 F (37 C) (Oral)   Wt 128 lb 8 oz (58.3 kg)   BMI 26.40 kg/m   Wt Readings from Last 3 Encounters:  12/02/16 128 lb 8 oz (58.3 kg)  09/08/16 125 lb (56.7 kg)  05/31/16 128 lb (58.1 kg)    Physical Exam  Constitutional: She appears well-developed and well-nourished. No distress.  HENT:  Head: Normocephalic and atraumatic.  Mouth/Throat: Oropharynx is clear and moist. No oropharyngeal exudate.    Posterior mouth R lower gum proximal to last molar with small flesh colored lesion ~1cm size ?ulcer vs growth  Eyes: Conjunctivae and EOM are normal. Pupils are  equal, round, and reactive to light. No scleral icterus.  Neck: Normal range of motion. Neck supple.  Cardiovascular: Normal rate, regular rhythm, normal heart sounds and intact distal pulses.   No murmur heard. Pulmonary/Chest: Effort normal and breath sounds normal. No respiratory distress. She has no wheezes. She has no rales.  Musculoskeletal: She exhibits no edema.  See HPI for foot exam if done  Lymphadenopathy:    She has no cervical adenopathy.  Skin: Skin is warm and dry. No rash noted.  Psychiatric: She has a normal mood and affect.  Nursing note and vitals reviewed.  Results for orders placed or performed in visit on 12/02/16  HM DIABETES EYE EXAM  Result Value Ref Range   HM Diabetic Eye Exam No Retinopathy No Retinopathy   Lab Results  Component Value Date   CHOL 333 (H) 05/28/2016   HDL 51.20 05/28/2016   LDLCALC 213 (H) 01/06/2007   LDLDIRECT 222.0 05/28/2016   TRIG 285.0 (H) 05/28/2016   CHOLHDL 7 05/28/2016      Assessment & Plan:   Problem List Items Addressed This Visit    Diabetes type 2, controlled ([place]) - Primary    Chronic, stable.  Foot exam today.  Diet controlled. No labs today.  If not on ARB, will need microalbumin. -check next visit 3 wk f/u.      Essential hypertension    Chronic, stable. Previously on losartan. Will need to verify this has continued.       Familial hyperlipidemia, high LDL    Intolerant to multiple meds. Off RYR. rec restart BID. Pt declines lipid clinic referral at this time.       Oral mucosal lesion    New. Unclear etiology. Non smoker, no LAD, no alcohol use. ?ulcer vs growth rec check with dentist on this lesion - due for dental exam.  RTC 3 wks recheck - if persistent, low threshold to refer to ENT.  Pt agrees.           Follow up plan: Return in about 3 weeks (around 12/23/2016) for follow up visit.  Ria Bush, MD

## 2016-12-02 NOTE — Assessment & Plan Note (Addendum)
New. Unclear etiology. Non smoker, no LAD, no alcohol use. ?ulcer vs growth rec check with dentist on this lesion - due for dental exam.  RTC 3 wks recheck - if persistent, low threshold to refer to ENT.  Pt agrees.

## 2016-12-02 NOTE — Patient Instructions (Addendum)
Restart red yeast rice twice daily.  You have hereditary high cholesterol levels. Think about lipid clinic to meet with pharmacist to review options for better cholesterol control. Return in 3 weeks for recheck spot in back of R mouth at bottom posterior gum. If persistent, we may refer you to ENT. See dentist if you can as well.  Good to see you today, call us with questions.

## 2016-12-02 NOTE — Assessment & Plan Note (Addendum)
Chronic, stable. Previously on losartan. Will need to verify this has continued.

## 2016-12-02 NOTE — Assessment & Plan Note (Addendum)
Chronic, stable.  Foot exam today.  Diet controlled. No labs today.  If not on ARB, will need microalbumin. -check next visit 3 wk f/u.

## 2016-12-02 NOTE — Progress Notes (Signed)
Pre visit review using our clinic review tool, if applicable. No additional management support is needed unless otherwise documented below in the visit note. 

## 2016-12-02 NOTE — Assessment & Plan Note (Signed)
Intolerant to multiple meds. Off RYR. rec restart BID. Pt declines lipid clinic referral at this time.

## 2016-12-17 ENCOUNTER — Ambulatory Visit (AMBULATORY_SURGERY_CENTER): Payer: Self-pay | Admitting: *Deleted

## 2016-12-17 VITALS — Ht 59.0 in | Wt 130.0 lb

## 2016-12-17 DIAGNOSIS — Z86010 Personal history of colonic polyps: Secondary | ICD-10-CM

## 2016-12-17 NOTE — Progress Notes (Signed)
Patient denies any allergies to eggs or soy. Patient denies any problems with anesthesia/sedation. Patient denies any oxygen use at home and does not take any diet/weight loss medications. EMMI education declined by patient.  

## 2016-12-31 ENCOUNTER — Encounter: Payer: PPO | Admitting: Internal Medicine

## 2017-01-10 ENCOUNTER — Encounter: Payer: Self-pay | Admitting: Internal Medicine

## 2017-01-20 DIAGNOSIS — Z85820 Personal history of malignant melanoma of skin: Secondary | ICD-10-CM | POA: Diagnosis not present

## 2017-01-20 DIAGNOSIS — D235 Other benign neoplasm of skin of trunk: Secondary | ICD-10-CM | POA: Diagnosis not present

## 2017-01-20 DIAGNOSIS — L814 Other melanin hyperpigmentation: Secondary | ICD-10-CM | POA: Diagnosis not present

## 2017-01-20 DIAGNOSIS — D1801 Hemangioma of skin and subcutaneous tissue: Secondary | ICD-10-CM | POA: Diagnosis not present

## 2017-01-20 DIAGNOSIS — L821 Other seborrheic keratosis: Secondary | ICD-10-CM | POA: Diagnosis not present

## 2017-01-24 ENCOUNTER — Encounter: Payer: PPO | Admitting: Internal Medicine

## 2017-02-28 DIAGNOSIS — M5416 Radiculopathy, lumbar region: Secondary | ICD-10-CM | POA: Diagnosis not present

## 2017-02-28 DIAGNOSIS — M5126 Other intervertebral disc displacement, lumbar region: Secondary | ICD-10-CM | POA: Diagnosis not present

## 2017-02-28 DIAGNOSIS — M48062 Spinal stenosis, lumbar region with neurogenic claudication: Secondary | ICD-10-CM | POA: Diagnosis not present

## 2017-04-19 ENCOUNTER — Other Ambulatory Visit: Payer: Self-pay | Admitting: Family Medicine

## 2017-04-19 ENCOUNTER — Other Ambulatory Visit: Payer: Self-pay | Admitting: *Deleted

## 2017-04-19 MED ORDER — SERTRALINE HCL 50 MG PO TABS: 50.0000 mg | ORAL_TABLET | Freq: Every day | ORAL | 1 refills | Status: DC

## 2017-05-31 DIAGNOSIS — M5416 Radiculopathy, lumbar region: Secondary | ICD-10-CM | POA: Diagnosis not present

## 2017-05-31 DIAGNOSIS — M48062 Spinal stenosis, lumbar region with neurogenic claudication: Secondary | ICD-10-CM | POA: Diagnosis not present

## 2017-05-31 DIAGNOSIS — M5126 Other intervertebral disc displacement, lumbar region: Secondary | ICD-10-CM | POA: Diagnosis not present

## 2017-07-07 ENCOUNTER — Telehealth: Payer: Self-pay

## 2017-07-07 NOTE — Telephone Encounter (Signed)
2 attempts to schedule carotid u/s fu  lmov

## 2017-07-25 DIAGNOSIS — L821 Other seborrheic keratosis: Secondary | ICD-10-CM | POA: Diagnosis not present

## 2017-07-25 DIAGNOSIS — L814 Other melanin hyperpigmentation: Secondary | ICD-10-CM | POA: Diagnosis not present

## 2017-07-25 DIAGNOSIS — Z85820 Personal history of malignant melanoma of skin: Secondary | ICD-10-CM | POA: Diagnosis not present

## 2017-07-27 DIAGNOSIS — Z961 Presence of intraocular lens: Secondary | ICD-10-CM | POA: Diagnosis not present

## 2017-07-27 DIAGNOSIS — H16141 Punctate keratitis, right eye: Secondary | ICD-10-CM | POA: Diagnosis not present

## 2017-07-27 DIAGNOSIS — H1131 Conjunctival hemorrhage, right eye: Secondary | ICD-10-CM | POA: Diagnosis not present

## 2017-09-06 DIAGNOSIS — M5416 Radiculopathy, lumbar region: Secondary | ICD-10-CM | POA: Diagnosis not present

## 2017-09-06 DIAGNOSIS — M5126 Other intervertebral disc displacement, lumbar region: Secondary | ICD-10-CM | POA: Diagnosis not present

## 2017-09-06 DIAGNOSIS — M48062 Spinal stenosis, lumbar region with neurogenic claudication: Secondary | ICD-10-CM | POA: Diagnosis not present

## 2017-10-12 ENCOUNTER — Other Ambulatory Visit: Payer: Self-pay | Admitting: Family Medicine

## 2017-10-28 ENCOUNTER — Other Ambulatory Visit: Payer: Self-pay | Admitting: *Deleted

## 2017-10-28 DIAGNOSIS — I6529 Occlusion and stenosis of unspecified carotid artery: Secondary | ICD-10-CM

## 2017-10-31 NOTE — Progress Notes (Signed)
Order in wq

## 2017-11-27 ENCOUNTER — Other Ambulatory Visit: Payer: Self-pay | Admitting: Family Medicine

## 2017-11-27 DIAGNOSIS — E7849 Other hyperlipidemia: Secondary | ICD-10-CM

## 2017-11-27 DIAGNOSIS — E119 Type 2 diabetes mellitus without complications: Secondary | ICD-10-CM

## 2017-11-27 DIAGNOSIS — K760 Fatty (change of) liver, not elsewhere classified: Secondary | ICD-10-CM

## 2017-11-30 ENCOUNTER — Ambulatory Visit (INDEPENDENT_AMBULATORY_CARE_PROVIDER_SITE_OTHER): Payer: PPO

## 2017-11-30 VITALS — BP 132/70 | HR 64 | Temp 98.4°F | Ht 58.5 in | Wt 127.5 lb

## 2017-11-30 DIAGNOSIS — E7849 Other hyperlipidemia: Secondary | ICD-10-CM | POA: Diagnosis not present

## 2017-11-30 DIAGNOSIS — K760 Fatty (change of) liver, not elsewhere classified: Secondary | ICD-10-CM | POA: Diagnosis not present

## 2017-11-30 DIAGNOSIS — E119 Type 2 diabetes mellitus without complications: Secondary | ICD-10-CM

## 2017-11-30 DIAGNOSIS — Z0000 Encounter for general adult medical examination without abnormal findings: Secondary | ICD-10-CM

## 2017-11-30 LAB — LIPID PANEL
CHOL/HDL RATIO: 6
Cholesterol: 266 mg/dL — ABNORMAL HIGH (ref 0–200)
HDL: 48.3 mg/dL (ref >39.00)
LDL Cholesterol: 191 mg/dL — ABNORMAL HIGH (ref 0–99)
NONHDL: 217.55
Triglycerides: 134 mg/dL (ref 0.0–149.0)
VLDL: 26.8 mg/dL (ref 0.0–40.0)

## 2017-11-30 LAB — COMPREHENSIVE METABOLIC PANEL
ALK PHOS: 45 U/L (ref 39–117)
ALT: 30 U/L (ref 0–35)
AST: 30 U/L (ref 0–37)
Albumin: 4.2 g/dL (ref 3.5–5.2)
BUN: 17 mg/dL (ref 6–23)
CO2: 32 mEq/L (ref 19–32)
Calcium: 9.4 mg/dL (ref 8.4–10.5)
Chloride: 103 mEq/L (ref 96–112)
Creatinine, Ser: 0.66 mg/dL (ref 0.40–1.20)
GFR: 93.57 mL/min (ref >60.00)
GLUCOSE: 112 mg/dL — AB (ref 70–99)
POTASSIUM: 4.5 meq/L (ref 3.5–5.1)
SODIUM: 140 meq/L (ref 135–145)
TOTAL PROTEIN: 6.8 g/dL (ref 6.0–8.3)
Total Bilirubin: 0.5 mg/dL (ref 0.2–1.2)

## 2017-11-30 LAB — MICROALBUMIN / CREATININE URINE RATIO
Creatinine,U: 129.4 mg/dL
MICROALB UR: 2.1 mg/dL — AB (ref 0.0–1.9)
Microalb Creat Ratio: 1.6 mg/g (ref 0.0–30.0)

## 2017-11-30 LAB — HEMOGLOBIN A1C: Hgb A1c MFr Bld: 6.7 % — ABNORMAL HIGH (ref 4.6–6.5)

## 2017-11-30 NOTE — Patient Instructions (Addendum)
Beth Bailey , Thank you for taking time to come for your Medicare Wellness Visit. I appreciate your ongoing commitment to your health goals. Please review the following plan we discussed and let me know if I can assist you in the future.   These are the goals we discussed: Goals    . Follow up with Primary Care Provider     Starting 11/30/2017, I will continue to take medications as prescribed and to keep appointments with PCP as scheduled.        This is a list of the screening recommended for you and due dates:  Health Maintenance  Topic Date Due  . Mammogram  10/10/2018*  . DTaP/Tdap/Td vaccine (1 - Tdap) 11/30/2018*  . Tetanus Vaccine  11/30/2018*  . Eye exam for diabetics  12/08/2018*  . Colon Cancer Screening  12/08/2018*  . Flu Shot  05/28/2029*  . Complete foot exam   12/02/2017  . Hemoglobin A1C  05/30/2018  . Urine Protein Check  11/30/2018  . DEXA scan (bone density measurement)  Completed  .  Hepatitis C: One time screening is recommended by Center for Disease Control  (CDC) for  adults born from [birth year] through 1965.   Completed  . Pneumonia vaccines  Completed  *Topic was postponed. The date shown is not the original due date.   Preventive Care for Adults  A healthy lifestyle and preventive care can promote health and wellness. Preventive health guidelines for adults include the following key practices.  . A routine yearly physical is a good way to check with your health care provider about your health and preventive screening. It is a chance to share any concerns and updates on your health and to receive a thorough exam.  . Visit your dentist for a routine exam and preventive care every 6 months. Brush your teeth twice a day and floss once a day. Good oral hygiene prevents tooth decay and gum disease.  . The frequency of eye exams is based on your age, health, family medical history, use  of contact lenses, and other factors. Follow your health care provider's  recommendations for frequency of eye exams.  . Eat a healthy diet. Foods like vegetables, fruits, whole grains, low-fat dairy products, and lean protein foods contain the nutrients you need without too many calories. Decrease your intake of foods high in solid fats, added sugars, and salt. Eat the right amount of calories for you. Get information about a proper diet from your health care provider, if necessary.  . Regular physical exercise is one of the most important things you can do for your health. Most adults should get at least 150 minutes of moderate-intensity exercise (any activity that increases your heart rate and causes you to sweat) each week. In addition, most adults need muscle-strengthening exercises on 2 or more days a week.  Silver Sneakers may be a benefit available to you. To determine eligibility, you may visit the website: www.silversneakers.com or contact program at (208)817-6639 Mon-Fri between 8AM-8PM.   . Maintain a healthy weight. The body mass index (BMI) is a screening tool to identify possible weight problems. It provides an estimate of body fat based on height and weight. Your health care provider can find your BMI and can help you achieve or maintain a healthy weight.   For adults 20 years and older: ? A BMI below 18.5 is considered underweight. ? A BMI of 18.5 to 24.9 is normal. ? A BMI of 25 to 29.9 is considered  overweight. ? A BMI of 30 and above is considered obese.   . Maintain normal blood lipids and cholesterol levels by exercising and minimizing your intake of saturated fat. Eat a balanced diet with plenty of fruit and vegetables. Blood tests for lipids and cholesterol should begin at age 54 and be repeated every 5 years. If your lipid or cholesterol levels are high, you are over 50, or you are at high risk for heart disease, you may need your cholesterol levels checked more frequently. Ongoing high lipid and cholesterol levels should be treated with  medicines if diet and exercise are not working.  . If you smoke, find out from your health care provider how to quit. If you do not use tobacco, please do not start.  . If you choose to drink alcohol, please do not consume more than 2 drinks per day. One drink is considered to be 12 ounces (355 mL) of beer, 5 ounces (148 mL) of wine, or 1.5 ounces (44 mL) of liquor.  . If you are 80-49 years old, ask your health care provider if you should take aspirin to prevent strokes.  . Use sunscreen. Apply sunscreen liberally and repeatedly throughout the day. You should seek shade when your shadow is shorter than you. Protect yourself by wearing long sleeves, pants, a wide-brimmed hat, and sunglasses year round, whenever you are outdoors.  . Once a month, do a whole body skin exam, using a mirror to look at the skin on your back. Tell your health care provider of new moles, moles that have irregular borders, moles that are larger than a pencil eraser, or moles that have changed in shape or color.

## 2017-11-30 NOTE — Progress Notes (Signed)
Subjective:   Beth Bailey is a 72 y.o. female who presents for Medicare Annual (Subsequent) preventive examination.  Review of Systems:  N/A Cardiac Risk Factors include: advanced age (>25men, >33 women);diabetes mellitus;dyslipidemia;hypertension     Objective:     Vitals: BP 132/70 (BP Location: Right Arm, Patient Position: Sitting, Cuff Size: Normal)   Pulse 64   Temp 98.4 F (36.9 C) (Oral)   Ht 4' 10.5" (1.486 m) Comment: no shoes  Wt 127 lb 8 oz (57.8 kg)   SpO2 98%   BMI 26.19 kg/m   Body mass index is 26.19 kg/m.  Advanced Directives 11/30/2017 05/28/2016 05/16/2015 12/07/2012  Does Patient Have a Medical Advance Directive? Yes Yes Yes Patient does not have advance directive  Type of Advance Directive [place];Living will [place];Living will [place];Living will -  Does patient want to make changes to medical advance directive? - No - Patient declined No - Patient declined -  Copy of [place] in Chart? No - copy requested No - copy requested No - copy requested -    Tobacco Social History   Tobacco Use  Smoking Status Never Smoker  Smokeless Tobacco Never Used     Counseling given: No   Clinical Intake:  Pre-visit preparation completed: Yes  Pain : 0-10 Pain Score: 5  Pain Type: Chronic pain Pain Location: Back Pain Orientation: Lower     Nutritional Status: BMI 25 -29 Overweight Nutritional Risks: None Diabetes: Yes CBG done?: No Did pt. bring in CBG monitor from home?: No  How often do you need to have someone help you when you read instructions, pamphlets, or other written materials from your doctor or pharmacy?: 1 - Never What is the last grade level you completed in school?: 10th grade  Interpreter Needed?: No  Comments: pt lives with spouse Information entered by :: LPinson, LPN  Past Medical History:  Diagnosis Date  . Allergic rhinitis    prior on  allergy shots  . Anxiety   . Arthritis   . Carotid stenosis 06/19/2016   R <40%, L 40-59%, rpt 1 yr (06/2016)  . Colon polyps   . GERD (gastroesophageal reflux disease)    controlled with PPI  . Herpes simplex of eye    right - blind s/p corneal transplant  . History of colon polyps   . History of hiatal hernia   . History of melanoma    on back [name])  . HLD (hyperlipidemia)    intolerant of statins  . HNP (herniated nucleus pulposus), lumbar 12/07/12   s/p surgery  . Hyperglycemia    pt is unsure about this  . Hypertension    has taken herself off her medications   . Lesion of spleen 2013   2cm, stable on recheck  . NAFLD (nonalcoholic fatty liver disease) 12/2013   by [place]  . Osteopenia 2016   dexa T -1.5 hip  . Pneumonia    Past Surgical History:  Procedure Laterality Date  . ABDOMINAL HYSTERECTOMY  1985   ovaries remain.  endometriosis, abnormal pap as well  . CARPAL TUNNEL RELEASE Bilateral   . CATARACT EXTRACTION Right 2013  . CHOLECYSTECTOMY  2007  . COLONOSCOPY  10/2010   polyp , mild divertic, int hem, rec rpt 5 yrs Carlean Purl)  . CORNEAL TRANSPLANT Right 2012  . dexa  2016   osteopenia T-1.5  . LUMBAR LAMINECTOMY/DECOMPRESSION MICRODISCECTOMY Right 12/07/2012   Procedure: LUMBAR  LAMINECTOMY/DECOMPRESSION MICRODISCECTOMY 1 LEVEL;  Surgeon: Winfield Cunas, MD; Laterality: Right;  Right lumbar five-sacral one diskectomy  . NECK SURGERY  1990s   plate in neck  . TONSILLECTOMY     Family History  Problem Relation Age of Onset  . Alzheimer's disease Father   . Arthritis Unknown   . Diabetes Maternal Grandmother   . Dementia Maternal Grandmother   . Stroke Maternal Grandmother   . Parkinson's disease Maternal Grandfather   . Colon cancer Sister 36  . Cancer Maternal Uncle        questionable type  . Lung cancer Paternal Uncle   . Heart attack Maternal Uncle   . Kidney disease Maternal Uncle   . Liver disease Neg Hx    Social History   Socioeconomic History   . Marital status: Married    Spouse name: None  . Number of children: None  . Years of education: None  . Highest education level: None  Social Needs  . Financial resource strain: None  . Food insecurity - worry: None  . Food insecurity - inability: None  . Transportation needs - medical: None  . Transportation needs - non-medical: None  Occupational History  . None  Tobacco Use  . Smoking status: Never Smoker  . Smokeless tobacco: Never Used  Substance and Sexual Activity  . Alcohol use: No  . Drug use: No  . Sexual activity: No    Birth control/protection: Other-see comments  Other Topics Concern  . None  Social History Narrative   Lives with husband, birds.  Grown dogs. One daughter - neighbor   Occupation: retired, worked at [employer]   [place] on joining gym   Diet: fruits/vegetables daily, some water   1-2 caffeinated beverages daily    Outpatient Encounter Medications as of 11/30/2017  Medication Sig  . Biotin 5000 MCG CAPS Take 10,000 mcg by mouth daily.   . bisacodyl (BISACODYL) 5 MG EC tablet Take 5 mg by mouth daily as needed.   . cetirizine (ZYRTEC) 10 MG tablet Take 10 mg by mouth at bedtime.   . gabapentin (NEURONTIN) 100 MG capsule Take 200 mg by mouth 2 (two) times daily.  Marland Kitchen HYDROcodone-acetaminophen (NORCO/VICODIN) 5-325 MG tablet TAKE 1 TABLET BY MOUTH EVERY 6 HOURS AS NEEDED FOR PAIN.  . Multiple Vitamin (MULTIVITAMIN WITH MINERALS) TABS tablet Take 1 tablet by mouth daily.  . Omega-3 Fatty Acids (FISH OIL) 1200 MG CAPS Take 1,200 mg by mouth daily.  Marland Kitchen omeprazole (PRILOSEC) 20 MG capsule TAKE ONE CAPSULE BY MOUTH DAILY  . OVER THE COUNTER MEDICATION Place 1 drop into both eyes 3 (three) times daily as needed (dry eyes). Over the counter "Eye Relief"  . polyethylene glycol powder (MIRALAX) powder Take 1 Container by mouth once.  . prednisoLONE acetate (PRED FORTE) 1 % ophthalmic suspension Place 1 drop into the right eye at  bedtime.  . sertraline (ZOLOFT) 50 MG tablet Take 1 tablet (50 mg total) by mouth daily.  . valACYclovir (VALTREX) 1000 MG tablet Take 1,000 mg by mouth at bedtime.   . vitamin B-12 (CYANOCOBALAMIN) 1000 MCG tablet Take 1,000 mcg by mouth daily.  . vitamin C (ASCORBIC ACID) 500 MG tablet Take 500 mg by mouth daily.  . [DISCONTINUED] Red Yeast Rice 600 MG CAPS Take 1 capsule by mouth 2 (two) times daily. Reported on 10/29/2015   No facility-administered encounter medications on file as of 11/30/2017.     Activities of Daily Living In your present  state of health, do you have any difficulty performing the following activities: 11/30/2017  Hearing? N  Vision? Y  Difficulty concentrating or making decisions? N  Walking or climbing stairs? Y  Dressing or bathing? N  Doing errands, shopping? N  Preparing Food and eating ? N  Using the Toilet? N  In the past six months, have you accidently leaked urine? N  Do you have problems with loss of bowel control? N  Managing your Medications? N  Managing your Finances? N  Housekeeping or managing your Housekeeping? N  Some recent data might be hidden    Patient Care Team: Ria Bush, MD as PCP - General (Family Medicine)    Assessment:   This is a routine wellness examination for [place].   Hearing Screening   125Hz  250Hz  500Hz  1000Hz  2000Hz  3000Hz  4000Hz  6000Hz  8000Hz   Right ear:   40 40 40  40    Left ear:   40 40 40  40    Vision Screening Comments: Last vision exam in 2018 with Dr. Susa Simmonds    Exercise Activities and Dietary recommendations Current Exercise Habits: The patient does not participate in regular exercise at present, Exercise limited by: Other - see comments(schedule/committments)  Goals    . Follow up with Primary Care Provider     Starting 11/30/2017, I will continue to take medications as prescribed and to keep appointments with PCP as scheduled.        Fall Risk Fall Risk  11/30/2017 05/28/2016 12/02/2014  09/25/2013  Falls in the past year? No No No No   Depression Screen PHQ 2/9 Scores 11/30/2017 05/28/2016 12/02/2014 09/25/2013  PHQ - 2 Score 0 0 0 0  PHQ- 9 Score 0 - - -     Cognitive Function MMSE - Mini Mental State Exam 11/30/2017 05/28/2016  Orientation to time 5 5  Orientation to Place 5 5  Registration 3 3  Attention/ Calculation 0 0  Recall 3 3  Language- name 2 objects 0 0  Language- repeat 1 1  Language- follow 3 step command 3 3  Language- read & follow direction 0 0  Write a sentence 0 0  Copy design 0 0  Total score 20 20        Immunization History  Administered Date(s) Administered  . Pneumococcal Conjugate-13 08/27/2014  . Pneumococcal Polysaccharide-23 05/31/2016    Screening Tests Health Maintenance  Topic Date Due  . MAMMOGRAM  10/10/2018 (Originally 09/14/2017)  . DTaP/Tdap/Td (1 - Tdap) 11/30/2018 (Originally 12/04/1964)  . TETANUS/TDAP  11/30/2018 (Originally 12/04/1964)  . OPHTHALMOLOGY EXAM  12/08/2018 (Originally 11/19/2017)  . COLONOSCOPY  12/08/2018 (Originally 10/16/2015)  . INFLUENZA VACCINE  05/28/2029 (Originally 05/11/2017)  . FOOT EXAM  12/02/2017  . HEMOGLOBIN A1C  05/30/2018  . URINE MICROALBUMIN  11/30/2018  . DEXA SCAN  Completed  . Hepatitis C Screening  Completed  . PNA vac Low Risk Adult  Completed      Plan:     I have personally reviewed, addressed, and noted the following in the patient's chart:  A. Medical and social history B. Use of alcohol, tobacco or illicit drugs  C. Current medications and supplements D. Functional ability and status E.  Nutritional status F.  Physical activity G. Advance directives H. List of other physicians I.  Hospitalizations, surgeries, and ER visits in previous 12 months J.  [place] to include hearing, vision, cognitive, depression L. Referrals and appointments - none  In addition, I have reviewed and  discussed with patient certain preventive protocols, quality metrics, and  best practice recommendations. A written personalized care plan for preventive services as well as general preventive health recommendations were provided to patient.  See attached scanned questionnaire for additional information.   Signed,   Lindell Noe, MHA, BS, LPN Health Coach

## 2017-11-30 NOTE — Progress Notes (Signed)
PCP notes:   Health maintenance:  Eye exam - addressed Colonoscopy - addressed Tetanus vaccine - postponed/insurance Mammogram - addressed  Abnormal screenings:   None  Patient concerns:   None  Nurse concerns:  None  Next PCP appt:   12/13/17 @ 1030

## 2017-12-04 NOTE — Progress Notes (Signed)
I reviewed health advisor's note, was available for consultation, and agree with documentation and plan.  

## 2017-12-13 ENCOUNTER — Encounter: Payer: Self-pay | Admitting: Family Medicine

## 2017-12-13 ENCOUNTER — Ambulatory Visit (INDEPENDENT_AMBULATORY_CARE_PROVIDER_SITE_OTHER): Payer: PPO | Admitting: Family Medicine

## 2017-12-13 VITALS — BP 124/76 | HR 68 | Temp 97.8°F | Wt 128.0 lb

## 2017-12-13 DIAGNOSIS — K760 Fatty (change of) liver, not elsewhere classified: Secondary | ICD-10-CM | POA: Diagnosis not present

## 2017-12-13 DIAGNOSIS — I6529 Occlusion and stenosis of unspecified carotid artery: Secondary | ICD-10-CM | POA: Diagnosis not present

## 2017-12-13 DIAGNOSIS — K219 Gastro-esophageal reflux disease without esophagitis: Secondary | ICD-10-CM

## 2017-12-13 DIAGNOSIS — I10 Essential (primary) hypertension: Secondary | ICD-10-CM | POA: Diagnosis not present

## 2017-12-13 DIAGNOSIS — Z0000 Encounter for general adult medical examination without abnormal findings: Secondary | ICD-10-CM | POA: Diagnosis not present

## 2017-12-13 DIAGNOSIS — E119 Type 2 diabetes mellitus without complications: Secondary | ICD-10-CM | POA: Diagnosis not present

## 2017-12-13 DIAGNOSIS — Z86010 Personal history of colonic polyps: Secondary | ICD-10-CM

## 2017-12-13 DIAGNOSIS — Z7189 Other specified counseling: Secondary | ICD-10-CM

## 2017-12-13 DIAGNOSIS — M8580 Other specified disorders of bone density and structure, unspecified site: Secondary | ICD-10-CM

## 2017-12-13 DIAGNOSIS — Z636 Dependent relative needing care at home: Secondary | ICD-10-CM

## 2017-12-13 DIAGNOSIS — E7849 Other hyperlipidemia: Secondary | ICD-10-CM

## 2017-12-13 MED ORDER — COLESTIPOL HCL 1 G PO TABS: 1.0000 g | ORAL_TABLET | Freq: Two times a day (BID) | ORAL | 6 refills | Status: DC

## 2017-12-13 MED ORDER — SERTRALINE HCL 50 MG PO TABS: 50.0000 mg | ORAL_TABLET | Freq: Every day | ORAL | 3 refills | Status: DC

## 2017-12-13 MED ORDER — OMEPRAZOLE 20 MG PO CPDR: 20.0000 mg | DELAYED_RELEASE_CAPSULE | Freq: Every day | ORAL | 6 refills | Status: DC

## 2017-12-13 NOTE — Assessment & Plan Note (Signed)
Chronic, diet controlled. RTC 6 mo f/u visit.

## 2017-12-13 NOTE — Assessment & Plan Note (Signed)
Due for f/u. Will schedule.

## 2017-12-13 NOTE — Assessment & Plan Note (Signed)
Due for f/u

## 2017-12-13 NOTE — Assessment & Plan Note (Signed)
Stable LFTs

## 2017-12-13 NOTE — Patient Instructions (Addendum)
Schedule mammogram and colonoscopy as due.  Check on date of carotid ultrasound (?3/13).  If interested, check with pharmacy about new 2 shot shingles series (shingrix).  Bring [place] copy of your living will.  Trial colestipol 1 tablet twice daily for cholesterol.  Return in 6 months for follow up visit with fasting labs prior  Health Maintenance, Female Adopting a healthy lifestyle and getting preventive care can go a long way to promote health and wellness. Talk with your health care provider about what schedule of regular examinations is right for you. This is a good chance for you to check in with your provider about disease prevention and staying healthy. In between checkups, there are plenty of things you can do on your own. Experts have done a lot of research about which lifestyle changes and preventive measures are most likely to keep you healthy. Ask your health care provider for more information. Weight and diet Eat a healthy diet  Be sure to include plenty of vegetables, fruits, low-fat dairy products, and lean protein.  Do not eat a lot of foods high in solid fats, added sugars, or salt.  Get regular exercise. This is one of the most important things you can do for your health. ? Most adults should exercise for at least 150 minutes each week. The exercise should increase your heart rate and make you sweat (moderate-intensity exercise). ? Most adults should also do strengthening exercises at least twice a week. This is in addition to the moderate-intensity exercise.  Maintain a healthy weight  Body mass index (BMI) is a measurement that can be used to identify possible weight problems. It estimates body fat based on height and weight. Your health care provider can help determine your BMI and help you achieve or maintain a healthy weight.  For females 27 years of age and older: ? A BMI below 18.5 is considered underweight. ? A BMI of 18.5 to 24.9 is normal. ? A BMI of 25 to 29.9 is  considered overweight. ? A BMI of 30 and above is considered obese.  Watch levels of cholesterol and blood lipids  You should start having your blood tested for lipids and cholesterol at 72 years of age, then have this test every 5 years.  You may need to have your cholesterol levels checked more often if: ? Your lipid or cholesterol levels are high. ? You are older than 72 years of age. ? You are at high risk for heart disease.  Cancer screening Lung Cancer  Lung cancer screening is recommended for adults 22-52 years old who are at high risk for lung cancer because of a history of smoking.  A yearly low-dose CT scan of the lungs is recommended for people who: ? Currently smoke. ? Have quit within the past 15 years. ? Have at least a 30-pack-year history of smoking. A pack year is smoking an average of one pack of cigarettes a day for 1 year.  Yearly screening should continue until it has been 15 years since you quit.  Yearly screening should stop if you develop a health problem that would prevent you from having lung cancer treatment.  Breast Cancer  Practice breast self-awareness. This means understanding how your breasts normally appear and feel.  It also means doing regular breast self-exams. Let your health care provider know about any changes, no matter how small.  If you are in your 20s or 30s, you should have a clinical breast exam (CBE) by a health care  provider every 1-3 years as part of a regular health exam.  If you are 40 or older, have a CBE every year. Also consider having a breast X-ray (mammogram) every year.  If you have a family history of breast cancer, talk to your health care provider about genetic screening.  If you are at high risk for breast cancer, talk to your health care provider about having an MRI and a mammogram every year.  Breast cancer gene (BRCA) assessment is recommended for women who have family members with BRCA-related cancers.  BRCA-related cancers include: ? Breast. ? Ovarian. ? Tubal. ? Peritoneal cancers.  Results of the assessment will determine the need for genetic counseling and BRCA1 and BRCA2 testing.  Cervical Cancer Your health care provider may recommend that you be screened regularly for cancer of the pelvic organs (ovaries, uterus, and vagina). This screening involves a pelvic examination, including checking for microscopic changes to the surface of your cervix (Pap test). You may be encouraged to have this screening done every 3 years, beginning at age 21.  For women ages 30-65, health care providers may recommend pelvic exams and Pap testing every 3 years, or they may recommend the Pap and pelvic exam, combined with testing for human papilloma virus (HPV), every 5 years. Some types of HPV increase your risk of cervical cancer. Testing for HPV may also be done on women of any age with unclear Pap test results.  Other health care providers may not recommend any screening for nonpregnant women who are considered low risk for pelvic cancer and who do not have symptoms. Ask your health care provider if a screening pelvic exam is right for you.  If you have had past treatment for cervical cancer or a condition that could lead to cancer, you need Pap tests and screening for cancer for at least 20 years after your treatment. If Pap tests have been discontinued, your risk factors (such as having a new sexual partner) need to be reassessed to determine if screening should resume. Some women have medical problems that increase the chance of getting cervical cancer. In these cases, your health care provider may recommend more frequent screening and Pap tests.  Colorectal Cancer  This type of cancer can be detected and often prevented.  Routine colorectal cancer screening usually begins at 72 years of age and continues through 72 years of age.  Your health care provider may recommend screening at an earlier age if  you have risk factors for colon cancer.  Your health care provider may also recommend using home test kits to check for hidden blood in the stool.  A small camera at the end of a tube can be used to examine your colon directly (sigmoidoscopy or colonoscopy). This is done to check for the earliest forms of colorectal cancer.  Routine screening usually begins at age 50.  Direct examination of the colon should be repeated every 5-10 years through 72 years of age. However, you may need to be screened more often if early forms of precancerous polyps or small growths are found.  Skin Cancer  Check your skin from head to toe regularly.  Tell your health care provider about any new moles or changes in moles, especially if there is a change in a mole's shape or color.  Also tell your health care provider if you have a mole that is larger than the size of a pencil eraser.  Always use sunscreen. Apply sunscreen liberally and repeatedly throughout the day.    Protect yourself by wearing long sleeves, pants, a wide-brimmed hat, and sunglasses whenever you are outside.  Heart disease, diabetes, and high blood pressure  High blood pressure causes heart disease and increases the risk of stroke. High blood pressure is more likely to develop in: ? People who have blood pressure in the high end of the normal range (130-139/85-89 mm Hg). ? People who are overweight or obese. ? People who are African American.  If you are 59-59 years of age, have your blood pressure checked every 3-5 years. If you are 12 years of age or older, have your blood pressure checked every year. You should have your blood pressure measured twice-once when you are at a hospital or clinic, and once when you are not at a hospital or clinic. Record the average of the two measurements. To check your blood pressure when you are not at a hospital or clinic, you can use: ? An automated blood pressure machine at a pharmacy. ? A home blood  pressure monitor.  If you are between 32 years and 28 years old, ask your health care provider if you should take aspirin to prevent strokes.  Have regular diabetes screenings. This involves taking a blood sample to check your fasting blood sugar level. ? If you are at a normal weight and have a low risk for diabetes, have this test once every three years after 72 years of age. ? If you are overweight and have a high risk for diabetes, consider being tested at a younger age or more often. Preventing infection Hepatitis B  If you have a higher risk for hepatitis B, you should be screened for this virus. You are considered at high risk for hepatitis B if: ? You were born in a country where hepatitis B is common. Ask your health care provider which countries are considered high risk. ? Your parents were born in a high-risk country, and you have not been immunized against hepatitis B (hepatitis B vaccine). ? You have HIV or AIDS. ? You use needles to inject street drugs. ? You live with someone who has hepatitis B. ? You have had sex with someone who has hepatitis B. ? You get hemodialysis treatment. ? You take certain medicines for conditions, including cancer, organ transplantation, and autoimmune conditions.  Hepatitis C  Blood testing is recommended for: ? Everyone born from [birth year] through 1965. ? Anyone with known risk factors for hepatitis C.  Sexually transmitted infections (STIs)  You should be screened for sexually transmitted infections (STIs) including gonorrhea and chlamydia if: ? You are sexually active and are younger than 72 years of age. ? You are older than 72 years of age and your health care provider tells you that you are at risk for this type of infection. ? Your sexual activity has changed since you were last screened and you are at an increased risk for chlamydia or gonorrhea. Ask your health care provider if you are at risk.  If you do not have HIV, but are at risk,  it may be recommended that you take a prescription medicine daily to prevent HIV infection. This is called pre-exposure prophylaxis (PrEP). You are considered at risk if: ? You are sexually active and do not regularly use condoms or know the HIV status of your partner(s). ? You take drugs by injection. ? You are sexually active with a partner who has HIV.  Talk with your health care provider about whether you are at high risk of  being infected with HIV. If you choose to begin PrEP, you should first be tested for HIV. You should then be tested every 3 months for as long as you are taking PrEP. Pregnancy  If you are premenopausal and you may become pregnant, ask your health care provider about preconception counseling.  If you may become pregnant, take 400 to 800 micrograms (mcg) of folic acid every day.  If you want to prevent pregnancy, talk to your health care provider about birth control (contraception). Osteoporosis and menopause  Osteoporosis is a disease in which the bones lose minerals and strength with aging. This can result in serious bone fractures. Your risk for osteoporosis can be identified using a bone density scan.  If you are 65 years of age or older, or if you are at risk for osteoporosis and fractures, ask your health care provider if you should be screened.  Ask your health care provider whether you should take a calcium or vitamin D supplement to lower your risk for osteoporosis.  Menopause may have certain physical symptoms and risks.  Hormone replacement therapy may reduce some of these symptoms and risks. Talk to your health care provider about whether hormone replacement therapy is right for you. Follow these instructions at home:  Schedule regular health, dental, and eye exams.  Stay current with your immunizations.  Do not use any tobacco products including cigarettes, chewing tobacco, or electronic cigarettes.  If you are pregnant, do not drink  alcohol.  If you are breastfeeding, limit how much and how often you drink alcohol.  Limit alcohol intake to no more than 1 drink per day for nonpregnant women. One drink equals 12 ounces of beer, 5 ounces of wine, or 1 ounces of hard liquor.  Do not use street drugs.  Do not share needles.  Ask your health care provider for help if you need support or information about quitting drugs.  Tell your health care provider if you often feel depressed.  Tell your health care provider if you have ever been abused or do not feel safe at home. This information is not intended to replace advice given to you by your health care provider. Make sure you discuss any questions you have with your health care provider. Document Released: 04/12/2011 Document Revised: 03/04/2016 Document Reviewed: 07/01/2015 Elsevier Interactive Patient Education  2018 [employer]  

## 2017-12-13 NOTE — Assessment & Plan Note (Signed)
Advanced directives: has this set up. Daughter (Michelle Harris) is HCPOA. Asked to bring us copy. 

## 2017-12-13 NOTE — Assessment & Plan Note (Signed)
UTD dexa

## 2017-12-13 NOTE — Assessment & Plan Note (Addendum)
Intolerant to statins. zetia too expensive. Previously declined lipid clinic eval and again declined at this time. Will trial colestipol 1gm tab BID. Discussed monitoring for diarrhea.  The 10-year ASCVD risk score Mikey Bussing DC Brooke Bonito., et al., 2013) is: 21.8%   Values used to calculate the score:     Age: 72 years     Sex: Female     Is Non-Hispanic African American: No     Diabetic: Yes     Tobacco smoker: No     Systolic Blood Pressure: 235 mmHg     Is BP treated: No     HDL Cholesterol: 48.3 mg/dL     Total Cholesterol: 266 mg/dL

## 2017-12-13 NOTE — Assessment & Plan Note (Signed)
Preventative protocols reviewed and updated unless pt declined. Discussed healthy diet and lifestyle.  

## 2017-12-13 NOTE — Progress Notes (Signed)
BP 124/76 (BP Location: Left Arm, Patient Position: Sitting, Cuff Size: Normal)   Pulse 68   Temp 97.8 F (36.6 C) (Oral)   Wt 128 lb (58.1 kg)   SpO2 93%   BMI 26.30 kg/m    CC: CPE Subjective:    Patient ID: Beth Bailey, female    DOB: 07-13-1946, 72 y.o.   MRN: [id number]  HPI: Beth Bailey is a 72 y.o. female presenting on 12/13/2017 for Medicare Wellness (Part 2)   Saw Katha Cabal 2 weeks ago for medicare wellness visit. Note reviewed.  Husband is at waiting room.  Sees Dr Sharlet Salina for chronic back pain.   Preventative: Mammogram 09/2016 normal.Breast exams at home.  Last colonoscopy 10/20/2010 - h/o polyps, rec rpt 5 yrs Carlean Purl). Due to reschedule Last well woman with prior PCP 2013, s/p hysterectomy 1985, ovaries remain. Denies pelvic pain or pressure or bleeding.  Dexa - 12/2014 osteopenia.  Flu - declines prevnar 08/2014. pneumovax - 05/2016 Tetanus - declines zostavax - declines shingrix - discussed Advanced directives: has this set up. Daughter Kearney Hard) is HCPOA. Asked to bring [place] copy. Seat belt use discussed Sunscreen use discussed. No changing moles on skin.  Due for eye exam Non smoker Alcohol - none  Lives with husband who she cares for (dementia), birds. Grown dogs. One daughter - neighbor Occupation: retired, worked at [employer]  Activity: no regular exercise, but walking regularly - has membership to [employer] Diet: fruits/vegetables daily, good water  Relevant past medical, surgical, family and social history reviewed and updated as indicated. Interim medical history since our last visit reviewed. Allergies and medications reviewed and updated. Outpatient Medications Prior to Visit  Medication Sig Dispense Refill  . Biotin 5000 MCG CAPS Take 10,000 mcg by mouth daily.     . bisacodyl (BISACODYL) 5 MG EC tablet Take 5 mg by mouth daily as needed.     . cetirizine (ZYRTEC) 10 MG tablet Take 10 mg by mouth at bedtime.     .  gabapentin (NEURONTIN) 100 MG capsule Take 3 capsules (300 mg total) by mouth 2 (two) times daily.    Marland Kitchen HYDROcodone-acetaminophen (NORCO/VICODIN) 5-325 MG tablet TAKE 1 TABLET BY MOUTH EVERY 6 HOURS AS NEEDED FOR PAIN. 30 tablet 0  . Multiple Vitamin (MULTIVITAMIN WITH MINERALS) TABS tablet Take 1 tablet by mouth daily.    . Omega-3 Fatty Acids (FISH OIL) 1200 MG CAPS Take 1,200 mg by mouth daily.    Marland Kitchen OVER THE COUNTER MEDICATION Place 1 drop into both eyes 3 (three) times daily as needed (dry eyes). Over the counter "Eye Relief"    . polyethylene glycol powder (MIRALAX) powder Take 1 Container by mouth once.    . prednisoLONE acetate (PRED FORTE) 1 % ophthalmic suspension Place 1 drop into the right eye at bedtime.    . valACYclovir (VALTREX) 1000 MG tablet Take 1,000 mg by mouth at bedtime.     . vitamin B-12 (CYANOCOBALAMIN) 1000 MCG tablet Take 1,000 mcg by mouth daily.    . vitamin C (ASCORBIC ACID) 500 MG tablet Take 500 mg by mouth daily.    Marland Kitchen gabapentin (NEURONTIN) 100 MG capsule Take 200 mg by mouth 2 (two) times daily.    Marland Kitchen omeprazole (PRILOSEC) 20 MG capsule TAKE ONE CAPSULE BY MOUTH DAILY 30 capsule 0  . sertraline (ZOLOFT) 50 MG tablet Take 1 tablet (50 mg total) by mouth daily. 90 tablet 1   No facility-administered medications prior to visit.  Per HPI unless specifically indicated in ROS section below Review of Systems  Constitutional: Positive for chills. Negative for activity change, appetite change, fatigue, fever and unexpected weight change.  HENT: Negative for hearing loss.   Eyes: Negative for visual disturbance.  Respiratory: Negative for cough, chest tightness, shortness of breath and wheezing.   Cardiovascular: Negative for chest pain, palpitations and leg swelling.  Gastrointestinal: Negative for abdominal distention, abdominal pain, blood in stool, constipation, diarrhea, nausea and vomiting.  Genitourinary: Negative for difficulty urinating and hematuria.    Musculoskeletal: Negative for arthralgias, myalgias and neck pain.  Skin: Negative for rash.  Neurological: Negative for dizziness, seizures, syncope and headaches.  Hematological: Negative for adenopathy. Does not bruise/bleed easily.  Psychiatric/Behavioral: Negative for dysphoric mood. The patient is not nervous/anxious.        Objective:    BP 124/76 (BP Location: Left Arm, Patient Position: Sitting, Cuff Size: Normal)   Pulse 68   Temp 97.8 F (36.6 C) (Oral)   Wt 128 lb (58.1 kg)   SpO2 93%   BMI 26.30 kg/m   Wt Readings from Last 3 Encounters:  12/13/17 128 lb (58.1 kg)  11/30/17 127 lb 8 oz (57.8 kg)  12/17/16 130 lb (59 kg)    BP Readings from Last 3 Encounters:  12/13/17 124/76  11/30/17 132/70  12/02/16 136/68    Physical Exam  Constitutional: She is oriented to person, place, and time. She appears well-developed and well-nourished. No distress.  HENT:  Head: Normocephalic and atraumatic.  Right Ear: Hearing, tympanic membrane, external ear and ear canal normal.  Left Ear: Hearing, tympanic membrane, external ear and ear canal normal.  Nose: Nose normal.  Mouth/Throat: Uvula is midline, oropharynx is clear and moist and mucous membranes are normal. No oropharyngeal exudate, posterior oropharyngeal edema or posterior oropharyngeal erythema.  Eyes: Conjunctivae and EOM are normal. Pupils are equal, round, and reactive to light. No scleral icterus.  Neck: Normal range of motion. Neck supple. Carotid bruit is present. No thyromegaly present.  Cardiovascular: Normal rate, regular rhythm, normal heart sounds and intact distal pulses.  No murmur heard. Pulses:      Radial pulses are 2+ on the right side, and 2+ on the left side.  Pulmonary/Chest: Effort normal and breath sounds normal. No respiratory distress. She has no wheezes. She has no rales.  Abdominal: Soft. Bowel sounds are normal. She exhibits no distension and no mass. There is no tenderness. There is no  rebound and no guarding.  Musculoskeletal: Normal range of motion. She exhibits no edema.  Lymphadenopathy:    She has no cervical adenopathy.  Neurological: She is alert and oriented to person, place, and time.  CN grossly intact, station and gait intact  Skin: Skin is warm and dry. No rash noted.  Psychiatric: She has a normal mood and affect. Her behavior is normal. Judgment and thought content normal.  Nursing note and vitals reviewed.  Results for orders placed or performed in visit on 11/30/17  Microalbumin / creatinine urine ratio  Result Value Ref Range   Microalb, Ur 2.1 (H) 0.0 - 1.9 mg/dL   Creatinine,U 129.4 mg/dL   Microalb Creat Ratio 1.6 0.0 - 30.0 mg/g  Hemoglobin A1c  Result Value Ref Range   Hgb A1c MFr Bld 6.7 (H) 4.6 - 6.5 %  Lipid panel  Result Value Ref Range   Cholesterol 266 (H) 0 - 200 mg/dL   Triglycerides 134.0 0.0 - 149.0 mg/dL   HDL 48.30 >39.00 mg/dL  VLDL 26.8 0.0 - 40.0 mg/dL   LDL Cholesterol 191 (H) 0 - 99 mg/dL   Total CHOL/HDL Ratio 6    NonHDL 217.55   Comprehensive metabolic panel  Result Value Ref Range   Sodium 140 135 - 145 mEq/L   Potassium 4.5 3.5 - 5.1 mEq/L   Chloride 103 96 - 112 mEq/L   CO2 32 19 - 32 mEq/L   Glucose, Bld 112 (H) 70 - 99 mg/dL   BUN 17 6 - 23 mg/dL   Creatinine, Ser 0.66 0.40 - 1.20 mg/dL   Total Bilirubin 0.5 0.2 - 1.2 mg/dL   Alkaline Phosphatase 45 39 - 117 U/L   AST 30 0 - 37 U/L   ALT 30 0 - 35 U/L   Total Protein 6.8 6.0 - 8.3 g/dL   Albumin 4.2 3.5 - 5.2 g/dL   Calcium 9.4 8.4 - 10.5 mg/dL   GFR 93.57 >60.00 mL/min      Assessment & Plan:   Problem List Items Addressed This Visit    Advanced care planning/counseling discussion    Advanced directives: has this set up. Daughter Kearney Hard) is HCPOA. Asked to bring [place] copy.      Caregiver burden   Carotid stenosis    Due for f/u. Will schedule.       Relevant Medications   colestipol (COLESTID) 1 g tablet   Diabetes type 2,  controlled (HCC)    Chronic, diet controlled. RTC 6 mo f/u visit.       Essential hypertension    Chronic, stable. Off medication at this time.       Relevant Medications   colestipol (COLESTID) 1 g tablet   Familial hyperlipidemia, high LDL    Intolerant to statins. zetia too expensive. Previously declined lipid clinic eval and again declined at this time. Will trial colestipol 1gm tab BID. Discussed monitoring for diarrhea.  The 10-year ASCVD risk score Mikey Bussing DC Brooke Bonito., et al., 2013) is: 21.8%   Values used to calculate the score:     Age: 70 years     Sex: Female     Is Non-Hispanic African American: No     Diabetic: Yes     Tobacco smoker: No     Systolic Blood Pressure: 540 mmHg     Is BP treated: No     HDL Cholesterol: 48.3 mg/dL     Total Cholesterol: 266 mg/dL       Relevant Medications   colestipol (COLESTID) 1 g tablet   GERD    Continue omeprazole 20mg  daily.       Relevant Medications   omeprazole (PRILOSEC) 20 MG capsule   Health maintenance examination - Primary    Preventative protocols reviewed and updated unless pt declined. Discussed healthy diet and lifestyle.       History of colon polyps    Due for f/u      NAFLD (nonalcoholic fatty liver disease)    Stable LFTs      Osteopenia    UTD dexa          Meds ordered this encounter  Medications  . sertraline (ZOLOFT) 50 MG tablet    Sig: Take 1 tablet (50 mg total) by mouth daily.    Dispense:  90 tablet    Refill:  3  . omeprazole (PRILOSEC) 20 MG capsule    Sig: Take 1 capsule (20 mg total) by mouth daily.    Dispense:  30 capsule    Refill:  6  . colestipol (COLESTID) 1 g tablet    Sig: Take 1 tablet (1 g total) by mouth 2 (two) times daily.    Dispense:  60 tablet    Refill:  6   No orders of the defined types were placed in this encounter.   Follow up plan: Return in about 6 months (around 06/15/2018) for follow up visit.  Ria Bush, MD

## 2017-12-13 NOTE — Assessment & Plan Note (Addendum)
Chronic, stable. Off medication at this time.

## 2017-12-13 NOTE — Assessment & Plan Note (Signed)
-  Continue omeprazole 20 mg daily

## 2017-12-20 DIAGNOSIS — M5416 Radiculopathy, lumbar region: Secondary | ICD-10-CM | POA: Diagnosis not present

## 2017-12-20 DIAGNOSIS — M5126 Other intervertebral disc displacement, lumbar region: Secondary | ICD-10-CM | POA: Diagnosis not present

## 2017-12-20 DIAGNOSIS — M48062 Spinal stenosis, lumbar region with neurogenic claudication: Secondary | ICD-10-CM | POA: Diagnosis not present

## 2017-12-21 ENCOUNTER — Ambulatory Visit (INDEPENDENT_AMBULATORY_CARE_PROVIDER_SITE_OTHER): Payer: PPO

## 2017-12-21 DIAGNOSIS — I6529 Occlusion and stenosis of unspecified carotid artery: Secondary | ICD-10-CM | POA: Diagnosis not present

## 2018-01-09 DIAGNOSIS — H524 Presbyopia: Secondary | ICD-10-CM | POA: Diagnosis not present

## 2018-01-09 DIAGNOSIS — H2512 Age-related nuclear cataract, left eye: Secondary | ICD-10-CM | POA: Diagnosis not present

## 2018-01-09 DIAGNOSIS — Z961 Presence of intraocular lens: Secondary | ICD-10-CM | POA: Diagnosis not present

## 2018-01-09 DIAGNOSIS — H52202 Unspecified astigmatism, left eye: Secondary | ICD-10-CM | POA: Diagnosis not present

## 2018-01-09 DIAGNOSIS — H5202 Hypermetropia, left eye: Secondary | ICD-10-CM | POA: Diagnosis not present

## 2018-02-08 DIAGNOSIS — T85398D Other mechanical complication of other ocular prosthetic devices, implants and grafts, subsequent encounter: Secondary | ICD-10-CM | POA: Diagnosis not present

## 2018-02-08 DIAGNOSIS — Z961 Presence of intraocular lens: Secondary | ICD-10-CM | POA: Diagnosis not present

## 2018-02-08 DIAGNOSIS — H2512 Age-related nuclear cataract, left eye: Secondary | ICD-10-CM | POA: Diagnosis not present

## 2018-02-10 ENCOUNTER — Other Ambulatory Visit: Payer: Self-pay | Admitting: Family Medicine

## 2018-02-10 DIAGNOSIS — Z1231 Encounter for screening mammogram for malignant neoplasm of breast: Secondary | ICD-10-CM

## 2018-03-08 DIAGNOSIS — Z85820 Personal history of malignant melanoma of skin: Secondary | ICD-10-CM | POA: Diagnosis not present

## 2018-03-08 DIAGNOSIS — D485 Neoplasm of uncertain behavior of skin: Secondary | ICD-10-CM | POA: Diagnosis not present

## 2018-03-08 DIAGNOSIS — L821 Other seborrheic keratosis: Secondary | ICD-10-CM | POA: Diagnosis not present

## 2018-03-08 DIAGNOSIS — D229 Melanocytic nevi, unspecified: Secondary | ICD-10-CM | POA: Diagnosis not present

## 2018-03-08 DIAGNOSIS — L814 Other melanin hyperpigmentation: Secondary | ICD-10-CM | POA: Diagnosis not present

## 2018-03-08 DIAGNOSIS — R238 Other skin changes: Secondary | ICD-10-CM | POA: Diagnosis not present

## 2018-03-08 DIAGNOSIS — C4371 Malignant melanoma of right lower limb, including hip: Secondary | ICD-10-CM | POA: Diagnosis not present

## 2018-03-08 DIAGNOSIS — D0371 Melanoma in situ of right lower limb, including hip: Secondary | ICD-10-CM | POA: Diagnosis not present

## 2018-03-08 DIAGNOSIS — L570 Actinic keratosis: Secondary | ICD-10-CM | POA: Diagnosis not present

## 2018-03-09 ENCOUNTER — Encounter (HOSPITAL_COMMUNITY): Payer: Self-pay

## 2018-03-09 ENCOUNTER — Other Ambulatory Visit: Payer: Self-pay

## 2018-03-09 ENCOUNTER — Emergency Department (HOSPITAL_COMMUNITY)
Admission: EM | Admit: 2018-03-09 | Discharge: 2018-03-09 | Disposition: A | Discharge status: Home / Self Care | Payer: PPO | Attending: Emergency Medicine | Admitting: Emergency Medicine

## 2018-03-09 ENCOUNTER — Emergency Department (HOSPITAL_COMMUNITY): Payer: PPO

## 2018-03-09 DIAGNOSIS — R109 Unspecified abdominal pain: Secondary | ICD-10-CM | POA: Diagnosis not present

## 2018-03-09 DIAGNOSIS — R112 Nausea with vomiting, unspecified: Secondary | ICD-10-CM | POA: Diagnosis not present

## 2018-03-09 DIAGNOSIS — I10 Essential (primary) hypertension: Secondary | ICD-10-CM | POA: Diagnosis not present

## 2018-03-09 DIAGNOSIS — R1110 Vomiting, unspecified: Secondary | ICD-10-CM | POA: Diagnosis not present

## 2018-03-09 DIAGNOSIS — Z79899 Other long term (current) drug therapy: Secondary | ICD-10-CM | POA: Diagnosis not present

## 2018-03-09 DIAGNOSIS — R1012 Left upper quadrant pain: Secondary | ICD-10-CM | POA: Diagnosis not present

## 2018-03-09 DIAGNOSIS — Z91040 Latex allergy status: Secondary | ICD-10-CM | POA: Insufficient documentation

## 2018-03-09 DIAGNOSIS — R110 Nausea: Secondary | ICD-10-CM

## 2018-03-09 LAB — COMPREHENSIVE METABOLIC PANEL
ALBUMIN: 4.1 g/dL (ref 3.5–5.0)
ALK PHOS: 57 U/L (ref 38–126)
ALT: 30 U/L (ref 14–54)
ANION GAP: 12 (ref 5–15)
AST: 34 U/L (ref 15–41)
BUN: 11 mg/dL (ref 6–20)
CALCIUM: 9.5 mg/dL (ref 8.9–10.3)
CO2: 25 mmol/L (ref 22–32)
CREATININE: 0.76 mg/dL (ref 0.44–1.00)
Chloride: 104 mmol/L (ref 101–111)
GFR calc Af Amer: >60 mL/min (ref >60)
GFR calc non Af Amer: >60 mL/min (ref >60)
GLUCOSE: 139 mg/dL — AB (ref 65–99)
Potassium: 4 mmol/L (ref 3.5–5.1)
Sodium: 141 mmol/L (ref 135–145)
TOTAL PROTEIN: 6.8 g/dL (ref 6.5–8.1)
Total Bilirubin: 0.5 mg/dL (ref 0.3–1.2)

## 2018-03-09 LAB — URINALYSIS, ROUTINE W REFLEX MICROSCOPIC
BILIRUBIN URINE: NEGATIVE
Bacteria, UA: NONE SEEN
GLUCOSE, UA: NEGATIVE mg/dL
KETONES UR: NEGATIVE mg/dL
Leukocytes, UA: NEGATIVE
NITRITE: NEGATIVE
PH: 6 (ref 5.0–8.0)
Protein, ur: NEGATIVE mg/dL
RBC / HPF: >50 RBC/hpf — ABNORMAL HIGH (ref 0–5)
SPECIFIC GRAVITY, URINE: 1.016 (ref 1.005–1.030)

## 2018-03-09 LAB — CBC
HCT: 42.8 % (ref 36.0–46.0)
Hemoglobin: 14.2 g/dL (ref 12.0–15.0)
MCH: 31 pg (ref 26.0–34.0)
MCHC: 33.2 g/dL (ref 30.0–36.0)
MCV: 93.4 fL (ref 78.0–100.0)
PLATELETS: 169 10*3/uL (ref 150–400)
RBC: 4.58 MIL/uL (ref 3.87–5.11)
RDW: 12.8 % (ref 11.5–15.5)
WBC: 10.6 10*3/uL — ABNORMAL HIGH (ref 4.0–10.5)

## 2018-03-09 LAB — LIPASE, BLOOD: Lipase: 39 U/L (ref 11–51)

## 2018-03-09 MED ORDER — IOHEXOL 300 MG/ML  SOLN
100.0000 mL | Freq: Once | INTRAMUSCULAR | Status: AC | PRN
  Administered 2018-03-09: 100 mL via INTRAVENOUS

## 2018-03-09 MED ORDER — ONDANSETRON HCL 4 MG/2ML IJ SOLN
4.0000 mg | Freq: Once | INTRAMUSCULAR | Status: AC
  Administered 2018-03-09: 4 mg via INTRAVENOUS
  Filled 2018-03-09: qty 2

## 2018-03-09 MED ORDER — ONDANSETRON 4 MG PO TBDP: 4.0000 mg | ORAL_TABLET | Freq: Three times a day (TID) | ORAL | 0 refills | Status: DC | PRN

## 2018-03-09 MED ORDER — MORPHINE SULFATE (PF) 4 MG/ML IV SOLN
4.0000 mg | Freq: Once | INTRAVENOUS | Status: AC
  Administered 2018-03-09: 4 mg via INTRAVENOUS
  Filled 2018-03-09: qty 1

## 2018-03-09 NOTE — ED Provider Notes (Signed)
Patient placed in Quick Look pathway, seen and evaluated   Chief Complaint: Left lower abdominal pain.  HPI:   Patient states that she developed lower abdominal pain this morning.  It is constant, becoming gradually worse.  She reports urinary frequency without dysuria or hematuria.  She had minimal nausea on arrival, has had increasing nausea and is actively vomiting.  She denies fevers, chills, chest pain, shortness of breath.  No history of similar.  ROS: Abdominal pain, nausea vomiting  Physical Exam:   Gen: No distress, patient appears in comfortable due to pain  Neuro: Awake and Alert  Skin: Warm    Focused Exam: Abdomen soft without rigidity, guarding, or distention.  Tenderness palpation of lower abdomen bilaterally.  Vomiting (no hemetemesis)  Will give medication for nausea and pain.  CT abdomen ordered, despite mostly reassuring labs due to patient's discomfort and age.  Initiation of care has begun. The patient has been counseled on the process, plan, and necessity for staying for the completion/evaluation, and the remainder of the medical screening examination    Franchot Heidelberg, PA-C 03/09/18 [place], [place], MD 03/16/18 1352

## 2018-03-09 NOTE — ED Provider Notes (Signed)
[facility] EMERGENCY DEPARTMENT Provider Note   CSN: [id number] Arrival date & time: 03/09/18  1209     History   Chief Complaint Chief Complaint  Patient presents with  . Abdominal Pain    HPI Beth Bailey is a 72 y.o. female.  The history is provided by the patient.  Abdominal Pain   This is a new problem. The current episode started 6 to 12 hours ago. The problem occurs constantly. The problem has been gradually improving. The pain is associated with an unknown factor. The pain is located in the LUQ. The quality of the pain is cramping. The pain is moderate. Associated symptoms include diarrhea (last week), nausea, vomiting and frequency. Pertinent negatives include fever, dysuria, hematuria and arthralgias. The symptoms are aggravated by vomiting. Nothing relieves the symptoms.    Past Medical History:  Diagnosis Date  . Allergic rhinitis    prior on allergy shots  . Anxiety   . Arthritis   . Carotid stenosis 06/19/2016   R <40%, L 40-59%, rpt 1 yr (06/2016)  . Colon polyps   . GERD (gastroesophageal reflux disease)    controlled with PPI  . Herpes simplex of eye    right - blind s/p corneal transplant  . History of colon polyps   . History of hiatal hernia   . History of melanoma    on back [name])  . HLD (hyperlipidemia)    intolerant of statins  . HNP (herniated nucleus pulposus), lumbar 12/07/12   s/p surgery  . Hyperglycemia    pt is unsure about this  . Hypertension    has taken herself off her medications   . Lesion of spleen 2013   2cm, stable on recheck  . NAFLD (nonalcoholic fatty liver disease) 12/2013   by [place]  . Osteopenia 2016   dexa T -1.5 hip  . Pneumonia     Patient Active Problem List   Diagnosis Date Noted  . Oral mucosal lesion 12/02/2016  . Carotid stenosis 06/19/2016  . Health maintenance examination 05/31/2016  . Right leg pain 10/29/2015  . Advanced care planning/counseling discussion 12/02/2014  . Lesion  of spleen   . Caregiver burden 12/18/2013  . NAFLD (nonalcoholic fatty liver disease) 09/30/2013  . Medicare annual wellness visit, subsequent 09/25/2013  . Osteopenia   . IBS (irritable bowel syndrome) 08/29/2013  . Internal hemorrhoids with Grade 2 prolapse, itching, bleeding and soiling 08/29/2013  . Familial hyperlipidemia, high LDL   . History of colon polyps   . Herpes simplex of eye   . HNP (herniated nucleus pulposus), lumbar 12/07/2012  . Diabetes type 2, controlled ([place]) 06/21/2008  . Essential hypertension 02/23/2007  . GERD 02/23/2007  . Osteoarthritis 02/23/2007    Past Surgical History:  Procedure Laterality Date  . ABDOMINAL HYSTERECTOMY  1985   ovaries remain.  endometriosis, abnormal pap as well  . CARPAL TUNNEL RELEASE Bilateral   . CATARACT EXTRACTION Right 2013  . CHOLECYSTECTOMY  2007  . COLONOSCOPY  10/2010   polyp , mild divertic, int hem, rec rpt 5 yrs Carlean Purl)  . CORNEAL TRANSPLANT Right 2012  . dexa  2016   osteopenia T-1.5  . LUMBAR LAMINECTOMY/DECOMPRESSION MICRODISCECTOMY Right 12/07/2012   Procedure: LUMBAR LAMINECTOMY/DECOMPRESSION MICRODISCECTOMY 1 LEVEL;  Surgeon: Winfield Cunas, MD; Laterality: Right;  Right lumbar five-sacral one diskectomy  . NECK SURGERY  1990s   plate in neck  . TONSILLECTOMY       OB History   None  Home Medications    Prior to Admission medications   Medication Sig Start Date End Date Taking? Authorizing Provider  Biotin 5000 MCG CAPS Take 5,000 mcg by mouth 2 (two) times a week.    Yes [provider]  cetirizine (ZYRTEC) 10 MG tablet Take 10 mg by mouth at bedtime.    Yes [provider]  colestipol (COLESTID) 1 g tablet Take 1 tablet (1 g total) by mouth 2 (two) times daily. 12/13/17  Yes Ria Bush, MD  gabapentin (NEURONTIN) 100 MG capsule Take 3 capsules (300 mg total) by mouth 2 (two) times daily. 12/13/17  Yes Ria Bush, MD  HYDROcodone-acetaminophen (NORCO/VICODIN) 5-325  MG tablet TAKE 1 TABLET BY MOUTH EVERY 6 HOURS AS NEEDED FOR PAIN. Patient taking differently: Take 1 tablet by mouth two times a day as needed for pain 12/23/15  Yes Ria Bush, MD  LOTEMAX 0.5 % ophthalmic suspension Place 1 drop into the right eye at bedtime.  01/09/18  Yes [provider]  Multiple Vitamin (MULTIVITAMIN WITH MINERALS) TABS tablet Take 1 tablet by mouth 2 (two) times a week.    Yes [provider]  Omega-3 Fatty Acids (FISH OIL PO) Take 1 capsule by mouth daily.   Yes [provider]  omeprazole (PRILOSEC) 20 MG capsule Take 1 capsule (20 mg total) by mouth daily. 12/13/17  Yes Ria Bush, MD  Polyethyl Glycol-Propyl Glycol 0.4-0.3 % SOLN Place 1 drop into both eyes 3 (three) times daily as needed (for dry eyes).    Yes [provider]  sertraline (ZOLOFT) 50 MG tablet Take 1 tablet (50 mg total) by mouth daily. Patient taking differently: Take 50 mg by mouth at bedtime.  12/13/17  Yes Ria Bush, MD  valACYclovir (VALTREX) 1000 MG tablet Take 1,000 mg by mouth at bedtime.    Yes [provider]  vitamin B-12 (CYANOCOBALAMIN) 1000 MCG tablet Take 1,000 mcg by mouth 3 (three) times a week.    Yes [provider]  vitamin C (ASCORBIC ACID) 500 MG tablet Take 500 mg by mouth 3 (three) times a week.    Yes [provider]  ondansetron (ZOFRAN ODT) 4 MG disintegrating tablet Take 1 tablet (4 mg total) by mouth every 8 (eight) hours as needed for nausea or vomiting. 03/09/18   Clifton James, MD    Family History Family History  Problem Relation Age of Onset  . Alzheimer's disease Father   . Arthritis Unknown   . Diabetes Maternal Grandmother   . Dementia Maternal Grandmother   . Stroke Maternal Grandmother   . Parkinson's disease Maternal Grandfather   . Colon cancer Sister 72  . Cancer Maternal Uncle        questionable type  . Lung cancer Paternal Uncle   . Heart attack Maternal Uncle   .  Kidney disease Maternal Uncle   . Liver disease Neg Hx     Social History Social History   Tobacco Use  . Smoking status: Never Smoker  . Smokeless tobacco: Never Used  Substance Use Topics  . Alcohol use: No  . Drug use: No     Allergies   Amoxicillin; Atorvastatin; Diclofenac sodium; Erythromycin; Penicillins; Pravastatin; Simvastatin; Statins; Trifluridine; Latex; and Lisinopril   Review of Systems Review of Systems  Constitutional: Negative for chills and fever.  HENT: Negative for ear pain and sore throat.   Eyes: Negative for pain and visual disturbance.  Respiratory: Negative for cough and shortness of breath.  Cardiovascular: Negative for chest pain and palpitations.  Gastrointestinal: Positive for abdominal pain, diarrhea (last week), nausea and vomiting.  Genitourinary: Positive for frequency. Negative for dysuria and hematuria.  Musculoskeletal: Negative for arthralgias and back pain.  Skin: Negative for color change and rash.  Neurological: Negative for seizures and syncope.  All other systems reviewed and are negative.    Physical Exam Updated Vital Signs BP (!) 173/76   Pulse 67   Temp 98.3 F (36.8 C)   Resp 20   Ht 4\' 10"  (1.473 m)   Wt 56.7 kg (125 lb)   SpO2 99%   BMI 26.13 kg/m   Physical Exam  Constitutional: She is oriented to person, place, and time. She appears well-developed and well-nourished. No distress.  HENT:  Head: Normocephalic and atraumatic.  Eyes: Conjunctivae are normal.  Neck: Neck supple.  Cardiovascular: Normal rate and regular rhythm.  No murmur heard. Pulmonary/Chest: Effort normal and breath sounds normal. No respiratory distress.  Abdominal: Soft. Normal appearance and bowel sounds are normal. She exhibits no distension. There is tenderness in the left upper quadrant. There is no rigidity, no rebound and no guarding.  Musculoskeletal: She exhibits no edema or tenderness.  Neurological: She is alert and oriented to  person, place, and time.  Skin: Skin is warm and dry.  Psychiatric: She has a normal mood and affect.  Nursing note and vitals reviewed.    ED Treatments / Results  Labs (all labs ordered are listed, but only abnormal results are displayed) Labs Reviewed  COMPREHENSIVE METABOLIC PANEL - Abnormal; Notable for the following components:      Result Value   Glucose, Bld 139 (*)    All other components within normal limits  CBC - Abnormal; Notable for the following components:   WBC 10.6 (*)    All other components within normal limits  URINALYSIS, ROUTINE W REFLEX MICROSCOPIC - Abnormal; Notable for the following components:   Hgb urine dipstick MODERATE (*)    RBC / HPF >50 (*)    All other components within normal limits  URINE CULTURE  LIPASE, BLOOD    EKG EKG Interpretation  Date/Time:  Thursday Mar 09 2018 12:13:55 EDT Ventricular Rate:  63 PR Interval:  146 QRS Duration: 84 QT Interval:  422 QTC Calculation: 431 R Axis:   103 Text Interpretation:  Normal sinus rhythm Rightward axis similar to previous from 05/2015 Confirmed by Virgel Manifold [phone]) on 03/09/2018 6:38:46 PM   Radiology Ct Abdomen Pelvis W Contrast  Result Date: 03/09/2018 CLINICAL DATA:  Nausea and vomiting with upper abdominal pain, acute onset. EXAM: CT ABDOMEN AND PELVIS WITH CONTRAST TECHNIQUE: Multidetector CT imaging of the abdomen and pelvis was performed using the standard protocol following bolus administration of intravenous contrast. CONTRAST:  163mL OMNIPAQUE IOHEXOL 300 MG/ML  SOLN COMPARISON:  Abdominal ultrasound 12/12/2013 FINDINGS: Lower chest: Normal Hepatobiliary: Diffuse fatty change of the liver. 6 cm simple cyst in the central portion of the liver, unchanged since 2015. 14 mm enhancing focus at the dome of the liver consistent with an atypical hemangioma or benign vascular focus. Previous cholecystectomy. No ductal dilatation. Pancreas: Normal Spleen: Multiple small slow enhancing foci  within the spleen, the largest at the caudal tip measuring 13 mm. Findings most consistent with multiple hemangiomas. Largest focus is unchanged since the previous ultrasound. The lesions are not suspicious. Adrenals/Urinary Tract: Normal adrenal glands. Normal kidneys. No cyst, mass, stone or hydronephrosis. Normal bladder. Stomach/Bowel: No bowel pathology is seen. No evidence  of obstruction. No evidence of inflammatory disease. Sigmoid diverticulosis without evidence of diverticulitis. Vascular/Lymphatic: Minimal aortic atherosclerosis, less than usually seen at this age. No aneurysm. IVC is normal. No retroperitoneal adenopathy. Reproductive: Previous hysterectomy.  No pelvic mass. Other: No free fluid or air. Musculoskeletal: Pronounced degenerative changes of the lower lumbar spine. IMPRESSION: No acute finding to definitely explain the clinical presentation. Diffuse fatty change of the liver which can be associated with abdominal pain. 6 cm benign cyst of the liver, unchanged since 2015. Benign appearing hyperenhancing focus at the dome of the liver. Probable multiple small hemangiomas of the spleen. Minimal aortic atherosclerosis for age. Lower lumbar degenerative changes. Sigmoid diverticulosis without evidence of diverticulitis. Electronically Signed   By: Nelson Chimes M.D.   On: 03/09/2018 15:32    Procedures Procedures (including critical care time)  Medications Ordered in ED Medications  ondansetron (ZOFRAN) injection 4 mg (4 mg Intravenous Given 03/09/18 1424)  morphine 4 MG/ML injection 4 mg (4 mg Intravenous Given 03/09/18 1424)  iohexol (OMNIPAQUE) 300 MG/ML solution 100 mL (100 mLs Intravenous Contrast Given 03/09/18 1505)     Initial Impression / Assessment and Plan / ED Course  I have reviewed the triage vital signs and the nursing notes.  Pertinent labs & imaging results that were available during my care of the patient were reviewed by me and considered in my medical decision  making (see chart for details).    Patient is a 72 year old female with history as above who presents with 1 day of abdominal pain and vomiting.  She reports that she felt fine when she awoke this morning.  However, when she was at the eye doctor with a family member, she began having nausea and vomiting.  She had sharp left-sided abdominal pain.  She vomited 3 times prior to arrival here.  She vomited once in the waiting room.  She was given morphine and Zofran.  Since that time, her nausea has resolved.  Her pain has significantly improved as well.  She endorses urinary frequency but no frank dysuria.  No recent fever or chills.  Last week, she had diarrhea for 3 days but that subsided.  She is very well-appearing here.  Her labs were notable for a mild leukocytosis.  She also had hematuria.  CT scan was obtained which showed no evidence of acute pathology.  She does have evidence of a liver cyst, fatty liver disease, and diverticulosis.  Her urine does not show significant signs of infection.  I suspect she likely has viral gastritis.  She appears stable for d/c at this time.  I discussed strict return precautions with her in detail.  Plan to f/u with PCP as discussed with the patient.  Final Clinical Impressions(s) / ED Diagnoses   Final diagnoses:  Abdominal pain, unspecified abdominal location  Nausea  Non-intractable vomiting with nausea, unspecified vomiting type    ED Discharge Orders        Ordered    ondansetron (ZOFRAN ODT) 4 MG disintegrating tablet  Every 8 hours PRN     03/09/18 2007       Clifton James, MD 03/09/18 2014    Virgel Manifold, MD 03/10/18 1450

## 2018-03-09 NOTE — Discharge Instructions (Addendum)
-   Your labs showed a small amount of blood in your urine today. This will need to be rechecked  - Your imaging also showed fatty liver disease, spleen hemangiomas, and a benign-appearing liver cyst. These will need to be followed by your primary physician.  - Return to the ED if your pain worsens or if you are unable to keep liquids down

## 2018-03-09 NOTE — ED Triage Notes (Signed)
Pt states she started having abd pain on left lower side this morning with NV. Denies diarrhea, last BM today, pt reports this was normal.

## 2018-03-10 ENCOUNTER — Ambulatory Visit: Payer: PPO

## 2018-03-10 LAB — URINE CULTURE: Culture: NO GROWTH

## 2018-03-14 ENCOUNTER — Ambulatory Visit (INDEPENDENT_AMBULATORY_CARE_PROVIDER_SITE_OTHER): Payer: PPO | Admitting: Family Medicine

## 2018-03-14 ENCOUNTER — Encounter: Payer: Self-pay | Admitting: Family Medicine

## 2018-03-14 VITALS — BP 136/78 | HR 75 | Temp 98.8°F | Ht 58.5 in | Wt 125.2 lb

## 2018-03-14 DIAGNOSIS — D739 Disease of spleen, unspecified: Secondary | ICD-10-CM

## 2018-03-14 DIAGNOSIS — R1084 Generalized abdominal pain: Secondary | ICD-10-CM | POA: Insufficient documentation

## 2018-03-14 DIAGNOSIS — R1030 Lower abdominal pain, unspecified: Secondary | ICD-10-CM

## 2018-03-14 DIAGNOSIS — K760 Fatty (change of) liver, not elsewhere classified: Secondary | ICD-10-CM

## 2018-03-14 DIAGNOSIS — D7389 Other diseases of spleen: Secondary | ICD-10-CM

## 2018-03-14 NOTE — Progress Notes (Signed)
BP 136/78 (BP Location: Left Arm, Patient Position: Sitting, Cuff Size: Normal)   Pulse 75   Temp 98.8 F (37.1 C) (Oral)   Ht 4' 10.5" (1.486 m)   Wt 125 lb 4 oz (56.8 kg)   SpO2 98%   BMI 25.73 kg/m    CC: hosp f/u visit Subjective:    Patient ID: Beth Bailey, female    DOB: July 20, 1946, 72 y.o.   MRN: [id number]  HPI: Beth Bailey is a 72 y.o. female presenting on 03/14/2018 for Hospitalization Follow-up (Seen at [facility] ED on 03/09/18, primary dx abd pain. )   Recent ER visit 5/30 for abd pain with unrevealing evaluation - ?passed ureteral stone given large hematuria on UA/micro. Records reviewed, contrasted CT was unrevealing. UCx no growth. Labs largely normal except for WBC 10.7. Treated with zofran, morphine. BP elevated.   Thursday 5/30 she started feeling ill - nausea and vomiting at mother's doctor's office. This was associated with LLQ pain with radiation into groin. No fevers/chills, no gross hematuria. Now largely resolved, some residual soreness LLQ.   She never had prior kidney stone.  CT showed diffuse fatty liver, benign liver and spleen lesions, and lower lumbar DDD as well as sigmoid diverticulosis without -itis.   Overdue for colonoscopy.   Relevant past medical, surgical, family and social history reviewed and updated as indicated. Interim medical history since our last visit reviewed. Allergies and medications reviewed and updated. Outpatient Medications Prior to Visit  Medication Sig Dispense Refill  . Biotin 5000 MCG CAPS Take 5,000 mcg by mouth 2 (two) times a week.     . cetirizine (ZYRTEC) 10 MG tablet Take 10 mg by mouth at bedtime.     . colestipol (COLESTID) 1 g tablet Take 1 tablet (1 g total) by mouth 2 (two) times daily. 60 tablet 6  . gabapentin (NEURONTIN) 100 MG capsule Take 3 capsules (300 mg total) by mouth 2 (two) times daily.    Marland Kitchen HYDROcodone-acetaminophen (NORCO/VICODIN) 5-325 MG tablet TAKE 1 TABLET BY MOUTH EVERY 6 HOURS AS NEEDED FOR  PAIN. (Patient taking differently: Take 1 tablet by mouth two times a day as needed for pain) 30 tablet 0  . LOTEMAX 0.5 % ophthalmic suspension Place 1 drop into the right eye at bedtime.   0  . Multiple Vitamin (MULTIVITAMIN WITH MINERALS) TABS tablet Take 1 tablet by mouth 2 (two) times a week.     . Omega-3 Fatty Acids (FISH OIL PO) Take 1 capsule by mouth daily.    Marland Kitchen omeprazole (PRILOSEC) 20 MG capsule Take 1 capsule (20 mg total) by mouth daily. 30 capsule 6  . ondansetron (ZOFRAN ODT) 4 MG disintegrating tablet Take 1 tablet (4 mg total) by mouth every 8 (eight) hours as needed for nausea or vomiting. 12 tablet 0  . Polyethyl Glycol-Propyl Glycol 0.4-0.3 % SOLN Place 1 drop into both eyes 3 (three) times daily as needed (for dry eyes).     . sertraline (ZOLOFT) 50 MG tablet Take 1 tablet (50 mg total) by mouth daily. (Patient taking differently: Take 50 mg by mouth at bedtime. ) 90 tablet 3  . valACYclovir (VALTREX) 1000 MG tablet Take 1,000 mg by mouth at bedtime.     . vitamin B-12 (CYANOCOBALAMIN) 1000 MCG tablet Take 1,000 mcg by mouth 3 (three) times a week.     . vitamin C (ASCORBIC ACID) 500 MG tablet Take 500 mg by mouth 3 (three) times a week.  No facility-administered medications prior to visit.      Per HPI unless specifically indicated in ROS section below Review of Systems     Objective:    BP 136/78 (BP Location: Left Arm, Patient Position: Sitting, Cuff Size: Normal)   Pulse 75   Temp 98.8 F (37.1 C) (Oral)   Ht 4' 10.5" (1.486 m)   Wt 125 lb 4 oz (56.8 kg)   SpO2 98%   BMI 25.73 kg/m   Wt Readings from Last 3 Encounters:  03/14/18 125 lb 4 oz (56.8 kg)  03/09/18 125 lb (56.7 kg)  12/13/17 128 lb (58.1 kg)    Physical Exam  Constitutional: She appears well-developed and well-nourished. No distress.  HENT:  Mouth/Throat: Oropharynx is clear and moist. No oropharyngeal exudate.  Eyes: Pupils are equal, round, and reactive to light. Conjunctivae and EOM  are normal.  Neck: Normal range of motion. Neck supple.  Cardiovascular: Normal rate, regular rhythm and normal heart sounds.  No murmur heard. Pulmonary/Chest: Effort normal and breath sounds normal. No respiratory distress. She has no wheezes. She has no rales.  Abdominal: Soft. Bowel sounds are normal. She exhibits no distension and no mass. There is no hepatosplenomegaly. There is tenderness (mild) in the right lower quadrant, suprapubic area and left lower quadrant. There is no rigidity, no rebound, no guarding and no CVA tenderness. No hernia.  Musculoskeletal: She exhibits no edema.  Psychiatric: She has a normal mood and affect.  Nursing note and vitals reviewed.  Results for orders placed or performed during the hospital encounter of 03/09/18  Urine culture  Result Value Ref Range   Specimen Description URINE, CLEAN CATCH    Special Requests NONE    Culture      NO GROWTH Performed at [facility], [place] [address], [place],  [address]    Report Status 03/10/2018 FINAL   Lipase, blood  Result Value Ref Range   Lipase 39 11 - 51 U/L  Comprehensive metabolic panel  Result Value Ref Range   Sodium 141 135 - 145 mmol/L   Potassium 4.0 3.5 - 5.1 mmol/L   Chloride 104 101 - 111 mmol/L   CO2 25 22 - 32 mmol/L   Glucose, Bld 139 (H) 65 - 99 mg/dL   BUN 11 6 - 20 mg/dL   Creatinine, Ser 0.76 0.44 - 1.00 mg/dL   Calcium 9.5 8.9 - 10.3 mg/dL   Total Protein 6.8 6.5 - 8.1 g/dL   Albumin 4.1 3.5 - 5.0 g/dL   AST 34 15 - 41 U/L   ALT 30 14 - 54 U/L   Alkaline Phosphatase 57 38 - 126 U/L   Total Bilirubin 0.5 0.3 - 1.2 mg/dL   GFR calc non Af Amer >60 >60 mL/min   GFR calc Af Amer >60 >60 mL/min   Anion gap 12 5 - 15  CBC  Result Value Ref Range   WBC 10.6 (H) 4.0 - 10.5 K/uL   RBC 4.58 3.87 - 5.11 MIL/uL   Hemoglobin 14.2 12.0 - 15.0 g/dL   HCT 42.8 36.0 - 46.0 %   MCV 93.4 78.0 - 100.0 fL   MCH 31.0 26.0 - 34.0 pg   MCHC 33.2 30.0 - 36.0 g/dL   RDW 12.8 11.5  - 15.5 %   Platelets 169 150 - 400 K/uL  Urinalysis, Routine w reflex microscopic  Result Value Ref Range   Color, Urine YELLOW YELLOW   APPearance CLEAR CLEAR   Specific  Gravity, Urine 1.016 1.005 - 1.030   pH 6.0 5.0 - 8.0   Glucose, UA NEGATIVE NEGATIVE mg/dL   Hgb urine dipstick MODERATE (A) NEGATIVE   Bilirubin Urine NEGATIVE NEGATIVE   Ketones, ur NEGATIVE NEGATIVE mg/dL   Protein, ur NEGATIVE NEGATIVE mg/dL   Nitrite NEGATIVE NEGATIVE   Leukocytes, UA NEGATIVE NEGATIVE   RBC / HPF >50 (H) 0 - 5 RBC/hpf   Bacteria, UA NONE SEEN NONE SEEN   Mucus PRESENT       Assessment & Plan:   Problem List Items Addressed This Visit    Lesion of spleen    Again noted on recent CT. Stable.       Lower abdominal pain - Primary    Overall benign exam today, unrevealing evaluation at ER visit last week. Records reviewed. Do agree with possible L kidney or ureteral stone that seems to have resolved. Reviewed importance of good hydration status. RTC 2 wks rpt urinalysis. Update [place] if not improving or any recurring symptoms. Pt agrees with plan.       NAFLD (nonalcoholic fatty liver disease)    Again noted on CT. Stable.           No orders of the defined types were placed in this encounter.  No orders of the defined types were placed in this encounter.   Follow up plan: Return if symptoms worsen or fail to improve.  Ria Bush, MD

## 2018-03-14 NOTE — Assessment & Plan Note (Signed)
Overall benign exam today, unrevealing evaluation at ER visit last week. Records reviewed. Do agree with possible L kidney or ureteral stone that seems to have resolved. Reviewed importance of good hydration status. RTC 2 wks rpt urinalysis. Update [place] if not improving or any recurring symptoms. Pt agrees with plan.

## 2018-03-14 NOTE — Assessment & Plan Note (Signed)
Again noted on CT. Stable.

## 2018-03-14 NOTE — Assessment & Plan Note (Addendum)
Again noted on recent CT. Stable.

## 2018-03-14 NOTE — Patient Instructions (Addendum)
Possible kidney stone.  Make sure you stay well hydrated to prevent recurrence.  Return in 2 weeks lab visit only to recheck urine and make sure bleeding has resolved.   Kidney Stones Kidney stones (urolithiasis) are solid, rock-like deposits that form inside of the organs that make urine (kidneys). A kidney stone may form in a kidney and move into the bladder, where it can cause intense pain and block the flow of urine. Kidney stones are created when high levels of certain minerals are found in the urine. They are usually passed through urination, but in some cases, medical treatment may be needed to remove them. What are the causes? Kidney stones may be caused by:  A condition in which certain glands produce too much parathyroid hormone (primary hyperparathyroidism), which causes too much calcium buildup in the blood.  Buildup of uric acid crystals in the bladder (hyperuricosuria). Uric acid is a chemical that the body produces when you eat certain foods. It usually exits the body in the urine.  Narrowing (stricture) of one or both of the tubes that drain urine from the kidneys to the bladder (ureters).  A kidney blockage that is present at birth (congenital obstruction).  Past surgery on the kidney or the ureters, such as gastric bypass surgery.  What increases the risk? The following factors make you more likely to develop kidney stones:  Having had a kidney stone in the past.  Having a family history of kidney stones.  Not drinking enough water.  Eating a diet that is high in protein, salt (sodium), or sugar.  Being overweight or obese.  What are the signs or symptoms? Symptoms of a kidney stone may include:  Nausea.  Vomiting.  Blood in the urine (hematuria).  Pain in the side of the abdomen, right below the ribs (flank pain). Pain usually spreads (radiates) to the groin.  Needing to urinate frequently or urgently.  How is this diagnosed? This condition may be  diagnosed based on:  Your medical history.  A physical exam.  Blood tests.  Urine tests.  CT scan.  Abdominal X-ray.  A procedure to examine the inside of the bladder (cystoscopy).  How is this treated? Treatment for kidney stones depends on the size, location, and makeup of the stones. Treatment may involve:  Analyzing your urine before and after you pass the stone through urination.  Being monitored at the hospital until you pass the stone through urination.  Increasing your fluid intake and decreasing the amount of calcium and protein in your diet.  A procedure to break up kidney stones in the bladder using: ? A focused beam of light (laser therapy). ? Shock waves (extracorporeal shock wave lithotripsy).  Surgery to remove kidney stones. This may be needed if you have severe pain or have stones that block your urinary tract.  Follow these instructions at home: Eating and drinking   Drink enough fluid to keep your urine clear or pale yellow. This will help you to pass the kidney stone.  If directed, change your diet. This may include: ? Limiting how much sodium you eat. ? Eating more fruits and vegetables. ? Limiting how much meat, poultry, fish, and eggs you eat.  Follow instructions from your health care provider about eating or drinking restrictions. General instructions  Collect urine samples as told by your health care provider. You may need to collect a urine sample: ? 24 hours after you pass the stone. ? 8-12 weeks after passing the kidney stone, and  every 6-12 months after that.  Strain your urine every time you urinate, for as long as directed. Use the strainer that your health care provider recommends.  Do not throw out the kidney stone after passing it. Keep the stone so it can be tested by your health care provider. Testing the makeup of your kidney stone may help prevent you from getting kidney stones in the future.  Take over-the-counter and  prescription medicines only as told by your health care provider.  Keep all follow-up visits as told by your health care provider. This is important. You may need follow-up X-rays or ultrasounds to make sure that your stone has passed. How is this prevented? To prevent another kidney stone:  Drink enough fluid to keep your urine clear or pale yellow. This is the best way to prevent kidney stones.  Eat a healthy diet and follow recommendations from your health care provider about foods to avoid. You may be instructed to eat a low-protein diet. Recommendations vary depending on the type of kidney stone that you have.  Maintain a healthy weight.  Contact a health care provider if:  You have pain that gets worse or does not get better with medicine. Get help right away if:  You have a fever or chills.  You develop severe pain.  You develop new abdominal pain.  You faint.  You are unable to urinate. This information is not intended to replace advice given to you by your health care provider. Make sure you discuss any questions you have with your health care provider. Document Released: 09/27/2005 Document Revised: 04/16/2016 Document Reviewed: 03/12/2016 Elsevier Interactive Patient Education  [employer].

## 2018-03-24 ENCOUNTER — Inpatient Hospital Stay: Payer: PPO | Admitting: Family Medicine

## 2018-03-27 ENCOUNTER — Other Ambulatory Visit: Payer: Self-pay | Admitting: Family Medicine

## 2018-03-27 DIAGNOSIS — E1169 Type 2 diabetes mellitus with other specified complication: Secondary | ICD-10-CM

## 2018-03-27 DIAGNOSIS — E7849 Other hyperlipidemia: Secondary | ICD-10-CM

## 2018-03-28 ENCOUNTER — Other Ambulatory Visit: Payer: PPO

## 2018-03-28 DIAGNOSIS — M5126 Other intervertebral disc displacement, lumbar region: Secondary | ICD-10-CM | POA: Diagnosis not present

## 2018-03-28 DIAGNOSIS — M5136 Other intervertebral disc degeneration, lumbar region: Secondary | ICD-10-CM | POA: Diagnosis not present

## 2018-03-28 DIAGNOSIS — M5416 Radiculopathy, lumbar region: Secondary | ICD-10-CM | POA: Diagnosis not present

## 2018-03-28 DIAGNOSIS — M48062 Spinal stenosis, lumbar region with neurogenic claudication: Secondary | ICD-10-CM | POA: Diagnosis not present

## 2018-03-29 ENCOUNTER — Encounter: Payer: Self-pay | Admitting: Family Medicine

## 2018-03-29 DIAGNOSIS — Z85820 Personal history of malignant melanoma of skin: Secondary | ICD-10-CM | POA: Insufficient documentation

## 2018-03-29 DIAGNOSIS — C4371 Malignant melanoma of right lower limb, including hip: Secondary | ICD-10-CM

## 2018-03-29 HISTORY — DX: Malignant melanoma of right lower limb, including hip: C43.71

## 2018-04-17 DIAGNOSIS — L989 Disorder of the skin and subcutaneous tissue, unspecified: Secondary | ICD-10-CM | POA: Diagnosis not present

## 2018-04-17 DIAGNOSIS — C4371 Malignant melanoma of right lower limb, including hip: Secondary | ICD-10-CM | POA: Diagnosis not present

## 2018-05-01 ENCOUNTER — Ambulatory Visit
Admission: RE | Admit: 2018-05-01 | Discharge: 2018-05-01 | Disposition: A | Discharge status: Home / Self Care | Payer: PPO | Source: Ambulatory Visit | Attending: Family Medicine | Admitting: Family Medicine

## 2018-05-01 DIAGNOSIS — Z1231 Encounter for screening mammogram for malignant neoplasm of breast: Secondary | ICD-10-CM | POA: Diagnosis not present

## 2018-05-02 LAB — HM MAMMOGRAPHY

## 2018-05-04 ENCOUNTER — Encounter: Payer: Self-pay | Admitting: Family Medicine

## 2018-05-17 DIAGNOSIS — T85398D Other mechanical complication of other ocular prosthetic devices, implants and grafts, subsequent encounter: Secondary | ICD-10-CM | POA: Diagnosis not present

## 2018-06-07 ENCOUNTER — Other Ambulatory Visit: Payer: Self-pay | Admitting: Family Medicine

## 2018-07-25 DIAGNOSIS — M5136 Other intervertebral disc degeneration, lumbar region: Secondary | ICD-10-CM | POA: Diagnosis not present

## 2018-07-25 DIAGNOSIS — M5126 Other intervertebral disc displacement, lumbar region: Secondary | ICD-10-CM | POA: Diagnosis not present

## 2018-07-25 DIAGNOSIS — M48062 Spinal stenosis, lumbar region with neurogenic claudication: Secondary | ICD-10-CM | POA: Diagnosis not present

## 2018-07-25 DIAGNOSIS — M5416 Radiculopathy, lumbar region: Secondary | ICD-10-CM | POA: Diagnosis not present

## 2018-10-17 DIAGNOSIS — M48062 Spinal stenosis, lumbar region with neurogenic claudication: Secondary | ICD-10-CM | POA: Diagnosis not present

## 2018-10-17 DIAGNOSIS — M5416 Radiculopathy, lumbar region: Secondary | ICD-10-CM | POA: Diagnosis not present

## 2018-10-17 DIAGNOSIS — M5136 Other intervertebral disc degeneration, lumbar region: Secondary | ICD-10-CM | POA: Diagnosis not present

## 2018-10-17 DIAGNOSIS — M5126 Other intervertebral disc displacement, lumbar region: Secondary | ICD-10-CM | POA: Diagnosis not present

## 2018-12-06 ENCOUNTER — Other Ambulatory Visit: Payer: Self-pay | Admitting: Family Medicine

## 2018-12-06 ENCOUNTER — Ambulatory Visit: Payer: PPO

## 2018-12-06 DIAGNOSIS — H25812 Combined forms of age-related cataract, left eye: Secondary | ICD-10-CM | POA: Diagnosis not present

## 2018-12-06 DIAGNOSIS — I10 Essential (primary) hypertension: Secondary | ICD-10-CM

## 2018-12-06 DIAGNOSIS — Z947 Corneal transplant status: Secondary | ICD-10-CM | POA: Diagnosis not present

## 2018-12-06 DIAGNOSIS — E7849 Other hyperlipidemia: Secondary | ICD-10-CM

## 2018-12-06 DIAGNOSIS — K760 Fatty (change of) liver, not elsewhere classified: Secondary | ICD-10-CM

## 2018-12-06 DIAGNOSIS — E1169 Type 2 diabetes mellitus with other specified complication: Secondary | ICD-10-CM

## 2018-12-06 DIAGNOSIS — Z961 Presence of intraocular lens: Secondary | ICD-10-CM | POA: Diagnosis not present

## 2018-12-08 ENCOUNTER — Ambulatory Visit (INDEPENDENT_AMBULATORY_CARE_PROVIDER_SITE_OTHER): Payer: PPO

## 2018-12-08 VITALS — BP 134/80 | HR 71 | Temp 98.1°F | Ht 59.0 in | Wt 127.3 lb

## 2018-12-08 DIAGNOSIS — I10 Essential (primary) hypertension: Secondary | ICD-10-CM

## 2018-12-08 DIAGNOSIS — K760 Fatty (change of) liver, not elsewhere classified: Secondary | ICD-10-CM | POA: Diagnosis not present

## 2018-12-08 DIAGNOSIS — E7849 Other hyperlipidemia: Secondary | ICD-10-CM

## 2018-12-08 DIAGNOSIS — E1169 Type 2 diabetes mellitus with other specified complication: Secondary | ICD-10-CM | POA: Diagnosis not present

## 2018-12-08 DIAGNOSIS — Z0000 Encounter for general adult medical examination without abnormal findings: Secondary | ICD-10-CM | POA: Diagnosis not present

## 2018-12-08 LAB — COMPREHENSIVE METABOLIC PANEL
ALT: 25 U/L (ref 0–35)
AST: 28 U/L (ref 0–37)
Albumin: 4.6 g/dL (ref 3.5–5.2)
Alkaline Phosphatase: 50 U/L (ref 39–117)
BUN: 14 mg/dL (ref 6–23)
CHLORIDE: 104 meq/L (ref 96–112)
CO2: 31 mEq/L (ref 19–32)
Calcium: 9.7 mg/dL (ref 8.4–10.5)
Creatinine, Ser: 0.7 mg/dL (ref 0.40–1.20)
GFR: 82.02 mL/min (ref >60.00)
Glucose, Bld: 103 mg/dL — ABNORMAL HIGH (ref 70–99)
POTASSIUM: 4.8 meq/L (ref 3.5–5.1)
Sodium: 143 mEq/L (ref 135–145)
Total Bilirubin: 0.4 mg/dL (ref 0.2–1.2)
Total Protein: 7 g/dL (ref 6.0–8.3)

## 2018-12-08 LAB — MICROALBUMIN / CREATININE URINE RATIO
CREATININE, U: 159.6 mg/dL
MICROALB UR: 3.1 mg/dL — AB (ref 0.0–1.9)
Microalb Creat Ratio: 2 mg/g (ref 0.0–30.0)

## 2018-12-08 LAB — LIPID PANEL
Cholesterol: 283 mg/dL — ABNORMAL HIGH (ref 0–200)
HDL: 54.7 mg/dL (ref >39.00)
LDL CALC: 193 mg/dL — AB (ref 0–99)
NonHDL: 228.19
Total CHOL/HDL Ratio: 5
Triglycerides: 178 mg/dL — ABNORMAL HIGH (ref 0.0–149.0)
VLDL: 35.6 mg/dL (ref 0.0–40.0)

## 2018-12-08 LAB — HEMOGLOBIN A1C: HEMOGLOBIN A1C: 6.6 % — AB (ref 4.6–6.5)

## 2018-12-08 NOTE — Progress Notes (Signed)
PCP notes:   Health maintenance:  Foot exam - PCP follow-up requested Tetanus vaccine - postponed/insurance A1C - completed Microalbumin - completed  Abnormal screenings:   None  Patient concerns:   Patient verbalized caregiver strain.   Nurse concerns:  None  Next PCP appt:   12/20/18 @ 1130

## 2018-12-08 NOTE — Patient Instructions (Signed)
Beth Bailey , Thank you for taking time to come for your Medicare Wellness Visit. I appreciate your ongoing commitment to your health goals. Please review the following plan we discussed and let me know if I can assist you in the future.   These are the goals we discussed: Goals    . Follow up with Primary Care Provider     Starting 12/08/2018 I will continue to take medications as prescribed and to keep appointments with PCP as scheduled.        This is a list of the screening recommended for you and due dates:  Health Maintenance  Topic Date Due  . Eye exam for diabetics  12/08/2018*  . Colon Cancer Screening  12/08/2018*  . Complete foot exam   12/20/2018*  . DTaP/Tdap/Td vaccine (1 - Tdap) 10/10/2020*  . Tetanus Vaccine  10/10/2020*  . Flu Shot  05/28/2029*  . Mammogram  05/03/2019  . Hemoglobin A1C  06/08/2019  . Urine Protein Check  12/09/2019  . DEXA scan (bone density measurement)  Completed  .  Hepatitis C: One time screening is recommended by Center for Disease Control  (CDC) for  adults born from [birth year] through 1965.   Completed  . Pneumonia vaccines  Completed  *Topic was postponed. The date shown is not the original due date.   Preventive Care for Adults  A healthy lifestyle and preventive care can promote health and wellness. Preventive health guidelines for adults include the following key practices.  . A routine yearly physical is a good way to check with your health care provider about your health and preventive screening. It is a chance to share any concerns and updates on your health and to receive a thorough exam.  . Visit your dentist for a routine exam and preventive care every 6 months. Brush your teeth twice a day and floss once a day. Good oral hygiene prevents tooth decay and gum disease.  . The frequency of eye exams is based on your age, health, family medical history, use  of contact lenses, and other factors. Follow your health care provider's  recommendations for frequency of eye exams.  . Eat a healthy diet. Foods like vegetables, fruits, whole grains, low-fat dairy products, and lean protein foods contain the nutrients you need without too many calories. Decrease your intake of foods high in solid fats, added sugars, and salt. Eat the right amount of calories for you. Get information about a proper diet from your health care provider, if necessary.  . Regular physical exercise is one of the most important things you can do for your health. Most adults should get at least 150 minutes of moderate-intensity exercise (any activity that increases your heart rate and causes you to sweat) each week. In addition, most adults need muscle-strengthening exercises on 2 or more days a week.  Silver Sneakers may be a benefit available to you. To determine eligibility, you may visit the website: www.silversneakers.com or contact program at (380)360-0661 Mon-Fri between 8AM-8PM.   . Maintain a healthy weight. The body mass index (BMI) is a screening tool to identify possible weight problems. It provides an estimate of body fat based on height and weight. Your health care provider can find your BMI and can help you achieve or maintain a healthy weight.   For adults 20 years and older: ? A BMI below 18.5 is considered underweight. ? A BMI of 18.5 to 24.9 is normal. ? A BMI of 25 to 29.9 is considered  overweight. ? A BMI of 30 and above is considered obese.   . Maintain normal blood lipids and cholesterol levels by exercising and minimizing your intake of saturated fat. Eat a balanced diet with plenty of fruit and vegetables. Blood tests for lipids and cholesterol should begin at age 54 and be repeated every 5 years. If your lipid or cholesterol levels are high, you are over 50, or you are at high risk for heart disease, you may need your cholesterol levels checked more frequently. Ongoing high lipid and cholesterol levels should be treated with  medicines if diet and exercise are not working.  . If you smoke, find out from your health care provider how to quit. If you do not use tobacco, please do not start.  . If you choose to drink alcohol, please do not consume more than 2 drinks per day. One drink is considered to be 12 ounces (355 mL) of beer, 5 ounces (148 mL) of wine, or 1.5 ounces (44 mL) of liquor.  . If you are 80-49 years old, ask your health care provider if you should take aspirin to prevent strokes.  . Use sunscreen. Apply sunscreen liberally and repeatedly throughout the day. You should seek shade when your shadow is shorter than you. Protect yourself by wearing long sleeves, pants, a wide-brimmed hat, and sunglasses year round, whenever you are outdoors.  . Once a month, do a whole body skin exam, using a mirror to look at the skin on your back. Tell your health care provider of new moles, moles that have irregular borders, moles that are larger than a pencil eraser, or moles that have changed in shape or color.

## 2018-12-08 NOTE — Progress Notes (Signed)
Subjective:   Beth Bailey is a 73 y.o. female who presents for Medicare Annual (Subsequent) preventive examination.  Review of Systems:  N/A Cardiac Risk Factors include: advanced age (>47men, >54 women);diabetes mellitus;dyslipidemia;hypertension     Objective:     Vitals: BP 134/80 (BP Location: Right Arm, Patient Position: Sitting, Cuff Size: Normal)   Pulse 71   Temp 98.1 F (36.7 C) (Oral)   Ht 4\' 11"  (1.499 m) Comment: shoes  Wt 127 lb 4.8 oz (57.7 kg)   SpO2 94%   BMI 25.71 kg/m   Body mass index is 25.71 kg/m.  Advanced Directives 12/08/2018 11/30/2017 05/28/2016 05/16/2015 12/07/2012  Does Patient Have a Medical Advance Directive? No Yes Yes Yes Patient does not have advance directive  Type of Advance Directive - [place];Living will [place];Living will [place];Living will -  Does patient want to make changes to medical advance directive? - - No - Patient declined No - Patient declined -  Copy of [place] in Chart? - No - copy requested No - copy requested No - copy requested -  Would patient like information on creating a medical advance directive? No - Patient declined - - - -    Tobacco Social History   Tobacco Use  Smoking Status Never Smoker  Smokeless Tobacco Never Used     Counseling given: No   Clinical Intake:  Pre-visit preparation completed: Yes  Pain : 0-10 Pain Score: 0-No pain Pain Type: Chronic pain Pain Location: Back     Nutritional Status: BMI 25 -29 Overweight Nutritional Risks: None Diabetes: No  How often do you need to have someone help you when you read instructions, pamphlets, or other written materials from your doctor or pharmacy?: 1 - Never  Interpreter Needed?: No  Comments: pt lives with spouse Information entered by :: LPinson, LPN  Past Medical History:  Diagnosis Date  . Allergic rhinitis    prior on allergy shots  . Anxiety     . Arthritis   . Carotid stenosis 06/19/2016   R <40%, L 40-59%, rpt 1 yr (06/2016)  . Colon polyps   . GERD (gastroesophageal reflux disease)    controlled with PPI  . Herpes simplex of eye    right - blind s/p corneal transplant  . History of colon polyps   . History of hiatal hernia   . History of melanoma    on back [name])  . HLD (hyperlipidemia)    intolerant of statins  . HNP (herniated nucleus pulposus), lumbar 12/07/12   s/p surgery  . Hyperglycemia    pt is unsure about this  . Hypertension    has taken herself off her medications   . Lesion of spleen 2013   2cm, stable on recheck  . Malignant melanoma of right thigh ([place]) 03/29/2018   breslow thickness 0.21mm (Dr Veleta Miners)  . NAFLD (nonalcoholic fatty liver disease) 12/2013   by [place]  . Osteopenia 2016   dexa T -1.5 hip  . Pneumonia    Past Surgical History:  Procedure Laterality Date  . ABDOMINAL HYSTERECTOMY  1985   ovaries remain.  endometriosis, abnormal pap as well  . CARPAL TUNNEL RELEASE Bilateral   . CATARACT EXTRACTION Right 2013  . CHOLECYSTECTOMY  2007  . COLONOSCOPY  10/2010   polyp , mild divertic, int hem, rec rpt 5 yrs Carlean Purl)  . CORNEAL TRANSPLANT Right 2012  . dexa  2016  osteopenia T-1.5  . LUMBAR LAMINECTOMY/DECOMPRESSION MICRODISCECTOMY Right 12/07/2012   Procedure: LUMBAR LAMINECTOMY/DECOMPRESSION MICRODISCECTOMY 1 LEVEL;  Surgeon: Winfield Cunas, MD; Laterality: Right;  Right lumbar five-sacral one diskectomy  . NECK SURGERY  1990s   plate in neck  . TONSILLECTOMY     Family History  Problem Relation Age of Onset  . Alzheimer's disease Father   . Arthritis Other   . Diabetes Maternal Grandmother   . Dementia Maternal Grandmother   . Stroke Maternal Grandmother   . Parkinson's disease Maternal Grandfather   . Colon cancer Sister 10  . Cancer Maternal Uncle        questionable type  . Lung cancer Paternal Uncle   . Heart attack Maternal Uncle   . Kidney disease Maternal Uncle   .  Liver disease Neg Hx    Social History   Socioeconomic History  . Marital status: Married    Spouse name: Not on file  . Number of children: Not on file  . Years of education: Not on file  . Highest education level: Not on file  Occupational History  . Not on file  Social Needs  . Financial resource strain: Not on file  . Food insecurity:    Worry: Not on file    Inability: Not on file  . Transportation needs:    Medical: Not on file    Non-medical: Not on file  Tobacco Use  . Smoking status: Never Smoker  . Smokeless tobacco: Never Used  Substance and Sexual Activity  . Alcohol use: No  . Drug use: No  . Sexual activity: Never    Birth control/protection: Other-see comments  Lifestyle  . Physical activity:    Days per week: Not on file    Minutes per session: Not on file  . Stress: Not on file  Relationships  . Social connections:    Talks on phone: Not on file    Gets together: Not on file    Attends religious service: Not on file    Active member of club or organization: Not on file    Attends meetings of clubs or organizations: Not on file    Relationship status: Not on file  Other Topics Concern  . Not on file  Social History Narrative   Lives with husband, birds.  Grown dogs. One daughter - neighbor   Occupation: retired, worked at [employer]   [place] on joining gym   Diet: fruits/vegetables daily, some water   1-2 caffeinated beverages daily    Outpatient Encounter Medications as of 12/08/2018  Medication Sig  . Biotin 5000 MCG CAPS Take 5,000 mcg by mouth 2 (two) times a week.   . cetirizine (ZYRTEC) 10 MG tablet Take 10 mg by mouth at bedtime.   . colestipol (COLESTID) 1 g tablet TAKE 1 TABLET BY MOUTH TWICE DAILY (Patient taking differently: Taking as needed due to aching pain in bones)  . gabapentin (NEURONTIN) 100 MG capsule Take 3 capsules (300 mg total) by mouth 2 (two) times daily.  Marland Kitchen HYDROcodone-acetaminophen  (NORCO/VICODIN) 5-325 MG tablet TAKE 1 TABLET BY MOUTH EVERY 6 HOURS AS NEEDED FOR PAIN. (Patient taking differently: Take 1 tablet by mouth two times a day as needed for pain)  . LOTEMAX 0.5 % ophthalmic suspension Place 1 drop into the right eye at bedtime.   . Multiple Vitamin (MULTIVITAMIN WITH MINERALS) TABS tablet Take 1 tablet by mouth 2 (two) times a week.   . Omega-3 Fatty Acids (FISH  OIL PO) Take 1 capsule by mouth daily.  Marland Kitchen omeprazole (PRILOSEC) 20 MG capsule TAKE 1 CAPSULE BY MOUTH ONCE DAILY  . ondansetron (ZOFRAN ODT) 4 MG disintegrating tablet Take 1 tablet (4 mg total) by mouth every 8 (eight) hours as needed for nausea or vomiting.  Vladimir Faster Glycol-Propyl Glycol 0.4-0.3 % SOLN Place 1 drop into both eyes 3 (three) times daily as needed (for dry eyes).   . sertraline (ZOLOFT) 50 MG tablet Take 1 tablet (50 mg total) by mouth daily. (Patient taking differently: Take 50 mg by mouth at bedtime. )  . valACYclovir (VALTREX) 1000 MG tablet Take 1,000 mg by mouth at bedtime.   . vitamin B-12 (CYANOCOBALAMIN) 1000 MCG tablet Take 1,000 mcg by mouth 3 (three) times a week.   . vitamin C (ASCORBIC ACID) 500 MG tablet Take 500 mg by mouth 3 (three) times a week.    No facility-administered encounter medications on file as of 12/08/2018.     Activities of Daily Living In your present state of health, do you have any difficulty performing the following activities: 12/08/2018  Hearing? N  Vision? N  Difficulty concentrating or making decisions? N  Walking or climbing stairs? N  Dressing or bathing? N  Doing errands, shopping? N  Preparing Food and eating ? N  Using the Toilet? N  In the past six months, have you accidently leaked urine? N  Do you have problems with loss of bowel control? N  Managing your Medications? N  Managing your Finances? N  Housekeeping or managing your Housekeeping? N  Some recent data might be hidden    Patient Care Team: Ria Bush, MD as PCP -  General (Family Medicine)    Assessment:   This is a routine wellness examination for Sharilynn.   Hearing Screening   125Hz  250Hz  500Hz  1000Hz  2000Hz  3000Hz  4000Hz  6000Hz  8000Hz   Right ear:   40 40 40  40    Left ear:   40 40 40  40    Vision Screening Comments: Vision exam in Feb 2020 with Dr. Darlys Gales   Exercise Activities and Dietary recommendations Current Exercise Habits: The patient does not participate in regular exercise at present, Exercise limited by: None identified  Goals    . Follow up with Primary Care Provider     Starting 12/08/2018 I will continue to take medications as prescribed and to keep appointments with PCP as scheduled.        Fall Risk Fall Risk  12/08/2018 11/30/2017 05/28/2016 12/02/2014 09/25/2013  Falls in the past year? 0 No No No No   Depression Screen PHQ 2/9 Scores 12/08/2018 11/30/2017 05/28/2016 12/02/2014  PHQ - 2 Score 0 0 0 0  PHQ- 9 Score 0 0 - -     Cognitive Function MMSE - Mini Mental State Exam 12/08/2018 11/30/2017 05/28/2016  Orientation to time 5 5 5   Orientation to Place 5 5 5   Registration 3 3 3   Attention/ Calculation 0 0 0  Recall 3 3 3   Language- name 2 objects 0 0 0  Language- repeat 1 1 1   Language- follow 3 step command 3 3 3   Language- read & follow direction 0 0 0  Write a sentence 0 0 0  Copy design 0 0 0  Total score 20 20 20      PLEASE NOTE: A Mini-Cog screen was completed. Maximum score is 20. A value of 0 denotes this part of [place] MMSE was not completed or the patient  failed this part of the Mini-Cog screening.   Mini-Cog Screening Orientation to Time - Max 5 pts Orientation to Place - Max 5 pts Registration - Max 3 pts Recall - Max 3 pts Language Repeat - Max 1 pts Language Follow 3 Step Command - Max 3 pts     Immunization History  Administered Date(s) Administered  . Pneumococcal Conjugate-13 08/27/2014  . Pneumococcal Polysaccharide-23 05/31/2016    Screening Tests Health Maintenance  Topic Date  Due  . OPHTHALMOLOGY EXAM  12/08/2018 (Originally 11/19/2017)  . COLONOSCOPY  12/08/2018 (Originally 10/16/2015)  . FOOT EXAM  12/20/2018 (Originally 12/02/2017)  . DTaP/Tdap/Td (1 - Tdap) 10/10/2020 (Originally 12/04/1956)  . TETANUS/TDAP  10/10/2020 (Originally 12/04/1964)  . INFLUENZA VACCINE  05/28/2029 (Originally 05/11/2018)  . MAMMOGRAM  05/03/2019  . HEMOGLOBIN A1C  06/08/2019  . URINE MICROALBUMIN  12/09/2019  . DEXA SCAN  Completed  . Hepatitis C Screening  Completed  . PNA vac Low Risk Adult  Completed       Plan:     I have personally reviewed, addressed, and noted the following in the patient's chart:  A. Medical and social history B. Use of alcohol, tobacco or illicit drugs  C. Current medications and supplements D. Functional ability and status E.  Nutritional status F.  Physical activity G. Advance directives H. List of other physicians I.  Hospitalizations, surgeries, and ER visits in previous 12 months J.  [place] to include hearing, vision, cognitive, depression L. Referrals and appointments - none  In addition, I have reviewed and discussed with patient certain preventive protocols, quality metrics, and best practice recommendations. A written personalized care plan for preventive services as well as general preventive health recommendations were provided to patient.  See attached scanned questionnaire for additional information.   Signed,   Lindell Noe, MHA, BS, LPN Health Coach

## 2018-12-20 ENCOUNTER — Encounter: Payer: Self-pay | Admitting: Family Medicine

## 2018-12-20 ENCOUNTER — Ambulatory Visit (INDEPENDENT_AMBULATORY_CARE_PROVIDER_SITE_OTHER): Payer: PPO | Admitting: Family Medicine

## 2018-12-20 ENCOUNTER — Other Ambulatory Visit: Payer: Self-pay

## 2018-12-20 VITALS — BP 136/72 | HR 66 | Temp 98.6°F | Ht 59.0 in | Wt 128.0 lb

## 2018-12-20 DIAGNOSIS — I6529 Occlusion and stenosis of unspecified carotid artery: Secondary | ICD-10-CM | POA: Diagnosis not present

## 2018-12-20 DIAGNOSIS — E7849 Other hyperlipidemia: Secondary | ICD-10-CM | POA: Diagnosis not present

## 2018-12-20 DIAGNOSIS — I10 Essential (primary) hypertension: Secondary | ICD-10-CM | POA: Diagnosis not present

## 2018-12-20 DIAGNOSIS — M8949 Other hypertrophic osteoarthropathy, multiple sites: Secondary | ICD-10-CM

## 2018-12-20 DIAGNOSIS — C4371 Malignant melanoma of right lower limb, including hip: Secondary | ICD-10-CM | POA: Diagnosis not present

## 2018-12-20 DIAGNOSIS — E1169 Type 2 diabetes mellitus with other specified complication: Secondary | ICD-10-CM | POA: Diagnosis not present

## 2018-12-20 DIAGNOSIS — Z860100 Personal history of colon polyps, unspecified: Secondary | ICD-10-CM

## 2018-12-20 DIAGNOSIS — M150 Primary generalized (osteo)arthritis: Secondary | ICD-10-CM | POA: Diagnosis not present

## 2018-12-20 DIAGNOSIS — M8580 Other specified disorders of bone density and structure, unspecified site: Secondary | ICD-10-CM | POA: Diagnosis not present

## 2018-12-20 DIAGNOSIS — K219 Gastro-esophageal reflux disease without esophagitis: Secondary | ICD-10-CM | POA: Diagnosis not present

## 2018-12-20 DIAGNOSIS — K760 Fatty (change of) liver, not elsewhere classified: Secondary | ICD-10-CM | POA: Diagnosis not present

## 2018-12-20 DIAGNOSIS — Z0000 Encounter for general adult medical examination without abnormal findings: Secondary | ICD-10-CM

## 2018-12-20 DIAGNOSIS — M159 Polyosteoarthritis, unspecified: Secondary | ICD-10-CM

## 2018-12-20 DIAGNOSIS — Z636 Dependent relative needing care at home: Secondary | ICD-10-CM

## 2018-12-20 MED ORDER — ATORVASTATIN CALCIUM 40 MG PO TABS: 40.0000 mg | ORAL_TABLET | ORAL | 3 refills | Status: DC

## 2018-12-20 MED ORDER — SERTRALINE HCL 100 MG PO TABS: 100.0000 mg | ORAL_TABLET | Freq: Every day | ORAL | 3 refills | Status: DC

## 2018-12-20 NOTE — Assessment & Plan Note (Addendum)
Chronic, stable off medication.  

## 2018-12-20 NOTE — Assessment & Plan Note (Addendum)
Overdue - advised to call to schedule colonoscopy.  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

## 2018-12-20 NOTE — Assessment & Plan Note (Signed)
Mild on last check 2019 - consider updating next year.

## 2018-12-20 NOTE — Progress Notes (Signed)
BP 136/72 (BP Location: Right Arm, Patient Position: Sitting, Cuff Size: Normal)   Pulse 66   Temp 98.6 F (37 C) (Oral)   Ht 4\' 11"  (1.499 m)   Wt 128 lb (58.1 kg)   SpO2 94%   BMI 25.85 kg/m    CC: CPE Subjective:    Patient ID: Beth Bailey, female    DOB: [birth year], 73 y.o.   MRN: [id number]  HPI: Beth Bailey is a 73 y.o. female presenting on 12/20/2018 for Annual Exam (Pt 2, )    Saw Beth Bailey last month for medicare wellness visit. Note reviewed. Ongoing caregiver strain (husband and 76yo mother). Daughter is going throug divorce. Feels sertraline 50mg  is effective - would like higher dose. 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   Sees Beth Bailey for chronic back pain.   Preventative: Last colonoscopy 10/20/2010 - h/o polyps, rec rpt 5 yrs Beth Bailey). Due to reschedule Last well woman with prior PCP 2013, s/p hysterectomy 1985, ovaries remain. Denies pelvic pain or pressure or bleeding. no skin changes.  Mammogram 04/2018 WNL.Breast examsat home.  Dexa -3/2016osteopenia T -1.5 at hip.  Flu - declines  prevnar 08/2014. pneumovax - 05/2016 Tetanus - declines zostavax - declines shingrix - discussed, declines Advanced directives: has this set up. Daughter Beth Bailey) is HCPOA.Asked to bring [place] copy. Seat belt use discussed Sunscreen use discussed. No changing moles on skin. Sees derm.  Non smoker Alcohol - none Dentist yearly - upper dentures  Due for eye exam   Lives with husbandwho she cares for (dementia), birds. Grown dogs. One daughter - neighbor Occupation: retired, worked at [employer]  Activity:no regular exercise, but walkingregularly - has membership to [employer] Diet: fruits/vegetables daily, good water     Relevant past medical, surgical, family and social history reviewed and updated as indicated. Interim medical history since our last visit reviewed. Allergies and medications reviewed and updated. Outpatient  Medications Prior to Visit  Medication Sig Dispense Refill  . Biotin 5000 MCG CAPS Take 5,000 mcg by mouth 2 (two) times a week.     . cetirizine (ZYRTEC) 10 MG tablet Take 10 mg by mouth at bedtime.     . gabapentin (NEURONTIN) 100 MG capsule Take 3 capsules (300 mg total) by mouth 2 (two) times daily.    Marland Kitchen HYDROcodone-acetaminophen (NORCO/VICODIN) 5-325 MG tablet TAKE 1 TABLET BY MOUTH EVERY 6 HOURS AS NEEDED FOR PAIN. (Patient taking differently: Take 1 tablet by mouth two times a day as needed for pain) 30 tablet 0  . LOTEMAX 0.5 % ophthalmic suspension Place 1 drop into the right eye at bedtime.   0  . Multiple Vitamin (MULTIVITAMIN WITH MINERALS) TABS tablet Take 1 tablet by mouth 2 (two) times a week.     . Omega-3 Fatty Acids (FISH OIL PO) Take 1 capsule by mouth daily.    Marland Kitchen omeprazole (PRILOSEC) 20 MG capsule TAKE 1 CAPSULE BY MOUTH ONCE DAILY 90 capsule 1  . ondansetron (ZOFRAN ODT) 4 MG disintegrating tablet Take 1 tablet (4 mg total) by mouth every 8 (eight) hours as needed for nausea or vomiting. 12 tablet 0  . Polyethyl Glycol-Propyl Glycol 0.4-0.3 % SOLN Place 1 drop into both eyes 3 (three) times daily as needed (for dry eyes).     . valACYclovir (VALTREX) 1000 MG tablet Take 1,000 mg by mouth at bedtime.     . vitamin B-12 (CYANOCOBALAMIN) 1000 MCG tablet Take 1,000 mcg by mouth 3 (three) times  a week.     . vitamin C (ASCORBIC ACID) 500 MG tablet Take 500 mg by mouth 3 (three) times a week.     . colestipol (COLESTID) 1 g tablet TAKE 1 TABLET BY MOUTH TWICE DAILY (Patient taking differently: Taking as needed due to aching pain in bones) 180 tablet 1  . sertraline (ZOLOFT) 50 MG tablet Take 1 tablet (50 mg total) by mouth daily. (Patient taking differently: Take 50 mg by mouth at bedtime. ) 90 tablet 3   No facility-administered medications prior to visit.      Per HPI unless specifically indicated in ROS section below Review of Systems  Constitutional: Negative for activity  change, appetite change, chills, fatigue, fever and unexpected weight change.  HENT: Negative for hearing loss.   Eyes: Negative for visual disturbance.  Respiratory: Negative for cough, chest tightness, shortness of breath and wheezing.   Cardiovascular: Negative for chest pain, palpitations and leg swelling.  Gastrointestinal: Positive for diarrhea. Negative for abdominal distention, abdominal pain, blood in stool, constipation, nausea and vomiting.  Genitourinary: Negative for difficulty urinating and hematuria.  Musculoskeletal: Negative for arthralgias, myalgias and neck pain.  Skin: Negative for rash.  Neurological: Negative for dizziness, seizures, syncope and headaches.  Hematological: Negative for adenopathy. Does not bruise/bleed easily.  Psychiatric/Behavioral: Positive for dysphoric mood. The patient is not nervous/anxious.    Objective:    BP 136/72 (BP Location: Right Arm, Patient Position: Sitting, Cuff Size: Normal)   Pulse 66   Temp 98.6 F (37 C) (Oral)   Ht 4\' 11"  (1.499 m)   Wt 128 lb (58.1 kg)   SpO2 94%   BMI 25.85 kg/m   Wt Readings from Last 3 Encounters:  12/20/18 128 lb (58.1 kg)  12/08/18 127 lb 4.8 oz (57.7 kg)  03/14/18 125 lb 4 oz (56.8 kg)    Physical Exam Vitals signs and nursing note reviewed.  Constitutional:      General: She is not in acute distress.    Appearance: Normal appearance. She is well-developed.  HENT:     Head: Normocephalic and atraumatic.     Right Ear: Hearing, tympanic membrane, ear canal and external ear normal.     Left Ear: Hearing, tympanic membrane, ear canal and external ear normal.     Nose: Nose normal.     Mouth/Throat:     Mouth: Mucous membranes are moist.     Pharynx: Uvula midline. No oropharyngeal exudate or posterior oropharyngeal erythema.  Eyes:     General: No scleral icterus.    Conjunctiva/sclera: Conjunctivae normal.     Pupils: Pupils are equal, round, and reactive to light.  Neck:      Musculoskeletal: Normal range of motion and neck supple.     Vascular: No carotid bruit.  Cardiovascular:     Rate and Rhythm: Normal rate and regular rhythm.     Pulses: Normal pulses.          Radial pulses are 2+ on the right side and 2+ on the left side.     Heart sounds: Normal heart sounds. No murmur.  Pulmonary:     Effort: Pulmonary effort is normal. No respiratory distress.     Breath sounds: Normal breath sounds. No wheezing, rhonchi or rales.  Abdominal:     General: Bowel sounds are normal. There is no distension.     Palpations: Abdomen is soft. There is no mass.     Tenderness: There is no abdominal tenderness. There is no  guarding or rebound.  Musculoskeletal: Normal range of motion.        General: Deformity (Arthritic changes to 2nd/3rd L DIP) present.  Lymphadenopathy:     Cervical: No cervical adenopathy.  Skin:    General: Skin is warm and dry.     Findings: No rash.  Neurological:     General: No focal deficit present.     Mental Status: She is alert.     Comments: CN grossly intact, station and gait intact  Psychiatric:        Mood and Affect: Mood normal.        Behavior: Behavior normal.        Thought Content: Thought content normal.        Judgment: Judgment normal.       Results for orders placed or performed in visit on 12/08/18  Microalbumin / creatinine urine ratio  Result Value Ref Range   Microalb, Ur 3.1 (H) 0.0 - 1.9 mg/dL   Creatinine,U 159.6 mg/dL   Microalb Creat Ratio 2.0 0.0 - 30.0 mg/g  Hemoglobin A1c  Result Value Ref Range   Hgb A1c MFr Bld 6.6 (H) 4.6 - 6.5 %  Comprehensive metabolic panel  Result Value Ref Range   Sodium 143 135 - 145 mEq/L   Potassium 4.8 3.5 - 5.1 mEq/L   Chloride 104 96 - 112 mEq/L   CO2 31 19 - 32 mEq/L   Glucose, Bld 103 (H) 70 - 99 mg/dL   BUN 14 6 - 23 mg/dL   Creatinine, Ser 0.70 0.40 - 1.20 mg/dL   Total Bilirubin 0.4 0.2 - 1.2 mg/dL   Alkaline Phosphatase 50 39 - 117 U/L   AST 28 0 - 37 U/L    ALT 25 0 - 35 U/L   Total Protein 7.0 6.0 - 8.3 g/dL   Albumin 4.6 3.5 - 5.2 g/dL   Calcium 9.7 8.4 - 10.5 mg/dL   GFR 82.02 >60.00 mL/min  Lipid panel  Result Value Ref Range   Cholesterol 283 (H) 0 - 200 mg/dL   Triglycerides 178.0 (H) 0.0 - 149.0 mg/dL   HDL 54.70 >39.00 mg/dL   VLDL 35.6 0.0 - 40.0 mg/dL   LDL Cholesterol 193 (H) 0 - 99 mg/dL   Total CHOL/HDL Ratio 5    NonHDL 228.19    Depression screen [facility] 2/9 12/08/2018 11/30/2017 05/28/2016 12/02/2014 09/25/2013  Decreased Interest 0 0 0 0 0  Down, Depressed, Hopeless 0 0 0 0 0  PHQ - 2 Score 0 0 0 0 0  Altered sleeping 0 0 - - -  Tired, decreased energy 0 0 - - -  Change in appetite 0 0 - - -  Feeling bad or failure about yourself  0 0 - - -  Trouble concentrating 0 0 - - -  Moving slowly or fidgety/restless 0 0 - - -  Suicidal thoughts 0 0 - - -  PHQ-9 Score 0 0 - - -  Difficult doing work/chores Not difficult at all Not difficult at all - - -   Assessment & Plan:   Problem List Items Addressed This Visit    Osteopenia    Consider updated DEXA next year.       Osteoarthritis   NAFLD (nonalcoholic fatty liver disease)    LFTs normal. Anticipate HS. Continue to monitor.       Malignant melanoma of right thigh [facility])    H/o this last year s/p removal. Now seeing derm regularly, considering  change in practice.       History of colon polyps    Overdue - advised to call to schedule colonoscopy.  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 Discussed healthy diet and lifestyle.       GERD    Continues omeprazole 20mg  daily.       Familial hyperlipidemia, high LDL    Reviewed trials to date. Colestipol once daily not very effective. Will retrial atorvastatin MWF dosing. If not tolerated, will refer to lipid clinic to discuss PCSK9 injections. The 10-year ASCVD risk score Beth Bussing DC Brooke Bonito., et  al., 2013) is: 27.7%   Values used to calculate the score:     Age: 63 years     Sex: Female     Is Non-Hispanic African American: No     Diabetic: Yes     Tobacco smoker: No     Systolic Blood Pressure: 417 mmHg     Is BP treated: No     HDL Cholesterol: 54.7 mg/dL     Total Cholesterol: 283 mg/dL       Relevant Medications   atorvastatin (LIPITOR) 40 MG tablet   Essential hypertension    Chronic, stable off medication.      Relevant Medications   atorvastatin (LIPITOR) 40 MG tablet   Diabetes type 2, controlled ([place])    UTD eye exam Beth Bailey) - records requested.  RTC 6 mo DM f/u visit       Relevant Medications   atorvastatin (LIPITOR) 40 MG tablet   Carotid stenosis    Mild on last check 2019 - consider updating next year.       Relevant Medications   atorvastatin (LIPITOR) 40 MG tablet   Caregiver burden    Support provided. Increase sertraline to 100mg  daily. On this for last 4+ yrs.          Meds ordered this encounter  Medications  . sertraline (ZOLOFT) 100 MG tablet    Sig: Take 1 tablet (100 mg total) by mouth daily.    Dispense:  90 tablet    Refill:  3    Note new dose  . atorvastatin (LIPITOR) 40 MG tablet    Sig: Take 1 tablet (40 mg total) by mouth every Monday, Wednesday, and Friday.    Dispense:  40 tablet    Refill:  3   No orders of the defined types were placed in this encounter.   Patient instructions: Try increased sertraline dose to 100mg  daily. Watch for headache or nausea/GI upset with changing dose.  Call Beth Celesta Aver office to schedule colonoscopy.  Sign release for latest eye exam from [facility]). Cholesterol remains too high - stop colestipol, start atorvastatin (lipitor) 40mg  Monday/Wednesday/Friday. Let me know how you do with this. If worsening muscle aches, we will refer you to lipid clinic at the heart doctor's office.  Return as needed or in 6 months for diabetes follow up   Follow up plan: Return in  about 6 months (around 06/22/2019) for follow up visit.  Ria Bush, MD

## 2018-12-20 NOTE — Assessment & Plan Note (Signed)
Continues omeprazole 53m daily.

## 2018-12-20 NOTE — Assessment & Plan Note (Addendum)
UTD eye exam Beth Bailey) - records requested.  RTC 6 mo DM f/u visit

## 2018-12-20 NOTE — Assessment & Plan Note (Signed)
Support provided. Increase sertraline to 100mg  daily. On this for last 4+ yrs.

## 2018-12-20 NOTE — Assessment & Plan Note (Signed)
Preventative protocols reviewed and updated unless pt declined. Discussed healthy diet and lifestyle.  

## 2018-12-20 NOTE — Assessment & Plan Note (Signed)
LFTs normal. Anticipate HS. Continue to monitor.

## 2018-12-20 NOTE — Assessment & Plan Note (Signed)
Reviewed trials to date. Colestipol once daily not very effective. Will retrial atorvastatin MWF dosing. If not tolerated, will refer to lipid clinic to discuss PCSK9 injections. The 10-year ASCVD risk score Mikey Bussing DC Brooke Bonito., et al., 2013) is: 27.7%   Values used to calculate the score:     Age: 73 years     Sex: Female     Is Non-Hispanic African American: No     Diabetic: Yes     Tobacco smoker: No     Systolic Blood Pressure: 923 mmHg     Is BP treated: No     HDL Cholesterol: 54.7 mg/dL     Total Cholesterol: 283 mg/dL

## 2018-12-20 NOTE — Assessment & Plan Note (Signed)
H/o this last year s/p removal. Now seeing derm regularly, considering change in practice.

## 2018-12-20 NOTE — Assessment & Plan Note (Signed)
Consider updated DEXA next year  

## 2018-12-20 NOTE — Assessment & Plan Note (Deleted)
Advanced directives: has this set up. Daughter (Michelle Harris) is HCPOA. Asked to bring us copy. 

## 2018-12-20 NOTE — Patient Instructions (Addendum)
Try increased sertraline dose to 100mg  daily. Watch for headache or nausea/GI upset with changing dose.  Call Dr Celesta Aver office to schedule colonoscopy.  Sign release for latest eye exam from [facility]). Cholesterol remains too high - stop colestipol, start atorvastatin (lipitor) 40mg  Monday/Wednesday/Friday. Let me know how you do with this. If worsening muscle aches, we will refer you to lipid clinic at the heart doctor's office.  Return as needed or in 6 months for diabetes follow up   Health Maintenance After Age 30 After age 70, you are at a higher risk for certain long-term diseases and infections as well as injuries from falls. Falls are a major cause of broken bones and head injuries in people who are older than age 64. Getting regular preventive care can help to keep you healthy and well. Preventive care includes getting regular testing and making lifestyle changes as recommended by your health care provider. Talk with your health care provider about:  Which screenings and tests you should have. A screening is a test that checks for a disease when you have no symptoms.  A diet and exercise plan that is right for you. What should I know about screenings and tests to prevent falls? Screening and testing are the best ways to find a health problem early. Early diagnosis and treatment give you the best chance of managing medical conditions that are common after age 8. Certain conditions and lifestyle choices may make you more likely to have a fall. Your health care provider may recommend:  Regular vision checks. Poor vision and conditions such as cataracts can make you more likely to have a fall. If you wear glasses, make sure to get your prescription updated if your vision changes.  Medicine review. Work with your health care provider to regularly review all of the medicines you are taking, including over-the-counter medicines. Ask your health care provider about any side  effects that may make you more likely to have a fall. Tell your health care provider if any medicines that you take make you feel dizzy or sleepy.  Osteoporosis screening. Osteoporosis is a condition that causes the bones to get weaker. This can make the bones weak and cause them to break more easily.  Blood pressure screening. Blood pressure changes and medicines to control blood pressure can make you feel dizzy.  Strength and balance checks. Your health care provider may recommend certain tests to check your strength and balance while standing, walking, or changing positions.  Foot health exam. Foot pain and numbness, as well as not wearing proper footwear, can make you more likely to have a fall.  Depression screening. You may be more likely to have a fall if you have a fear of falling, feel emotionally low, or feel unable to do activities that you used to do.  Alcohol use screening. Using too much alcohol can affect your balance and may make you more likely to have a fall. What actions can I take to lower my risk of falls? General instructions  Talk with your health care provider about your risks for falling. Tell your health care provider if: ? You fall. Be sure to tell your health care provider about all falls, even ones that seem minor. ? You feel dizzy, sleepy, or off-balance.  Take over-the-counter and prescription medicines only as told by your health care provider. These include any supplements.  Eat a healthy diet and maintain a healthy weight. A healthy diet includes low-fat dairy products, low-fat (lean)  meats, and fiber from whole grains, beans, and lots of fruits and vegetables. Home safety  Remove any tripping hazards, such as rugs, cords, and clutter.  Install safety equipment such as grab bars in bathrooms and safety rails on stairs.  Keep rooms and walkways well-lit. Activity   Follow a regular exercise program to stay fit. This will help you maintain your  balance. Ask your health care provider what types of exercise are appropriate for you.  If you need a cane or walker, use it as recommended by your health care provider.  Wear supportive shoes that have nonskid soles. Lifestyle  Do not drink alcohol if your health care provider tells you not to drink.  If you drink alcohol, limit how much you have: ? 0-1 drink a day for women. ? 0-2 drinks a day for men.  Be aware of how much alcohol is in your drink. In the [place] one drink equals one typical bottle of beer (12 oz), one-half glass of wine (5 oz), or one shot of hard liquor (1 oz).  Do not use any products that contain nicotine or tobacco, such as cigarettes and e-cigarettes. If you need help quitting, ask your health care provider. Summary  Having a healthy lifestyle and getting preventive care can help to protect your health and wellness after age 85.  Screening and testing are the best way to find a health problem early and help you avoid having a fall. Early diagnosis and treatment give you the best chance for managing medical conditions that are more common for people who are older than age 15.  Falls are a major cause of broken bones and head injuries in people who are older than age 62. Take precautions to prevent a fall at home.  Work with your health care provider to learn what changes you can make to improve your health and wellness and to prevent falls. This information is not intended to replace advice given to you by your health care provider. Make sure you discuss any questions you have with your health care provider. Document Released: 08/10/2017 Document Revised: 08/10/2017 Document Reviewed: 08/10/2017 Elsevier Interactive Patient Education  2019 [employer].

## 2019-01-10 ENCOUNTER — Ambulatory Visit (INDEPENDENT_AMBULATORY_CARE_PROVIDER_SITE_OTHER): Payer: PPO | Admitting: Family Medicine

## 2019-01-10 ENCOUNTER — Telehealth: Payer: Self-pay

## 2019-01-10 ENCOUNTER — Encounter: Payer: Self-pay | Admitting: Family Medicine

## 2019-01-10 ENCOUNTER — Ambulatory Visit
Admission: RE | Admit: 2019-01-10 | Discharge: 2019-01-10 | Disposition: A | Discharge status: Home / Self Care | Payer: PPO | Source: Ambulatory Visit | Attending: Family Medicine | Admitting: Family Medicine

## 2019-01-10 ENCOUNTER — Other Ambulatory Visit: Payer: Self-pay

## 2019-01-10 VITALS — BP 130/68 | HR 76 | Temp 98.9°F | Ht 59.0 in | Wt 126.4 lb

## 2019-01-10 DIAGNOSIS — I739 Peripheral vascular disease, unspecified: Secondary | ICD-10-CM | POA: Diagnosis not present

## 2019-01-10 DIAGNOSIS — K5730 Diverticulosis of large intestine without perforation or abscess without bleeding: Secondary | ICD-10-CM | POA: Diagnosis not present

## 2019-01-10 DIAGNOSIS — K589 Irritable bowel syndrome without diarrhea: Secondary | ICD-10-CM

## 2019-01-10 DIAGNOSIS — K760 Fatty (change of) liver, not elsewhere classified: Secondary | ICD-10-CM | POA: Diagnosis not present

## 2019-01-10 DIAGNOSIS — Z636 Dependent relative needing care at home: Secondary | ICD-10-CM

## 2019-01-10 DIAGNOSIS — D7389 Other diseases of spleen: Secondary | ICD-10-CM

## 2019-01-10 DIAGNOSIS — R079 Chest pain, unspecified: Secondary | ICD-10-CM | POA: Diagnosis not present

## 2019-01-10 DIAGNOSIS — R1031 Right lower quadrant pain: Secondary | ICD-10-CM | POA: Diagnosis not present

## 2019-01-10 DIAGNOSIS — R1084 Generalized abdominal pain: Secondary | ICD-10-CM

## 2019-01-10 DIAGNOSIS — E7849 Other hyperlipidemia: Secondary | ICD-10-CM

## 2019-01-10 DIAGNOSIS — D739 Disease of spleen, unspecified: Secondary | ICD-10-CM

## 2019-01-10 LAB — COMPREHENSIVE METABOLIC PANEL
ALT: 30 U/L (ref 0–35)
AST: 31 U/L (ref 0–37)
Albumin: 4.6 g/dL (ref 3.5–5.2)
Alkaline Phosphatase: 66 U/L (ref 39–117)
BUN: 11 mg/dL (ref 6–23)
CO2: 29 mEq/L (ref 19–32)
Calcium: 9.8 mg/dL (ref 8.4–10.5)
Chloride: 100 mEq/L (ref 96–112)
Creatinine, Ser: 0.66 mg/dL (ref 0.40–1.20)
GFR: 87.76 mL/min (ref >60.00)
Glucose, Bld: 137 mg/dL — ABNORMAL HIGH (ref 70–99)
Potassium: 4.2 mEq/L (ref 3.5–5.1)
Sodium: 139 mEq/L (ref 135–145)
Total Bilirubin: 0.5 mg/dL (ref 0.2–1.2)
Total Protein: 7.5 g/dL (ref 6.0–8.3)

## 2019-01-10 LAB — CBC WITH DIFFERENTIAL/PLATELET
Basophils Absolute: 0 10*3/uL (ref 0.0–0.1)
Basophils Relative: 0.6 % (ref 0.0–3.0)
Eosinophils Absolute: 0.1 10*3/uL (ref 0.0–0.7)
Eosinophils Relative: 1.5 % (ref 0.0–5.0)
HCT: 41.1 % (ref 36.0–46.0)
Hemoglobin: 13.9 g/dL (ref 12.0–15.0)
Lymphocytes Relative: 30.1 % (ref 12.0–46.0)
Lymphs Abs: 2.3 10*3/uL (ref 0.7–4.0)
MCHC: 33.8 g/dL (ref 30.0–36.0)
MCV: 93.2 fl (ref 78.0–100.0)
Monocytes Absolute: 0.5 10*3/uL (ref 0.1–1.0)
Monocytes Relative: 6.5 % (ref 3.0–12.0)
Neutro Abs: 4.7 10*3/uL (ref 1.4–7.7)
Neutrophils Relative %: 61.3 % (ref 43.0–77.0)
Platelets: 163 10*3/uL (ref 150.0–400.0)
RBC: 4.41 Mil/uL (ref 3.87–5.11)
RDW: 13.5 % (ref 11.5–15.5)
WBC: 7.7 10*3/uL (ref 4.0–10.5)

## 2019-01-10 LAB — POC URINALSYSI DIPSTICK (AUTOMATED)
Blood, UA: NEGATIVE
Glucose, UA: NEGATIVE
Ketones, UA: NEGATIVE
Nitrite, UA: NEGATIVE
Protein, UA: NEGATIVE
Spec Grav, UA: 1.025 (ref 1.010–1.025)
Urobilinogen, UA: 2 E.U./dL — AB
pH, UA: 6 (ref 5.0–8.0)

## 2019-01-10 LAB — LIPASE: Lipase: 44 U/L (ref 11.0–59.0)

## 2019-01-10 MED ORDER — IOHEXOL 300 MG/ML  SOLN
100.0000 mL | Freq: Once | INTRAMUSCULAR | Status: AC | PRN
  Administered 2019-01-10: 15:00:00 100 mL via INTRAVENOUS

## 2019-01-10 NOTE — Telephone Encounter (Signed)
On 01/09/19 pt had pressure on rt side of chest that moved to upper rt abd where pt had sharpe pain on and off. No CP. Pt called EMS but pt did not want to go to ED; pt took hydocodone, pepto bismol and an antacid. The pain eased. Today there is a dull pain in rt upper abd. Pt describes as feels touchy under rt breast and if feels like a "catch". No fever, cough or SOB.no traveling and no known exposure to covid or flu. Pt scheduled appt 01/10/19 at 12 noon with Dr Darnell Level per Dr G.ED precautions given. FYI to Dr Darnell Level.

## 2019-01-10 NOTE — Assessment & Plan Note (Addendum)
Predominantly R sided, of sudden onset last night. She does have guarding and mild rebound, I do think she will need urgent CT abd/pelvis to help r/o acute intra abdominal process, am suspicious for small gallstone causing problem. UA today with large bili and urobilinogen, micro without significant white cells pointing against infection. UCx sent. Will check labs (CBC, CMP, lipase). Pt and daughter agree with plan.  Story/exam not consistent with cardiac cause to R sided chest pressure.

## 2019-01-10 NOTE — Addendum Note (Signed)
Addended by: Ria Bush on: 01/10/2019 04:29 PM   Modules accepted: Orders

## 2019-01-10 NOTE — Telephone Encounter (Signed)
Agree. Thanks

## 2019-01-10 NOTE — Patient Instructions (Addendum)
Urinalysis today.  Labs today.  Urine culture sent. I am suspicious for liver cause but given your ongoing tenderness let's check CT scan today. See Rosaria Ferries to get this scheduled.

## 2019-01-10 NOTE — Assessment & Plan Note (Signed)
Doing better on sertraline 100mg  - continue.

## 2019-01-10 NOTE — Assessment & Plan Note (Signed)
Seems to be tolerating MWF atorvastatin - continue.

## 2019-01-10 NOTE — Progress Notes (Addendum)
BP 130/68 (BP Location: Left Arm, Patient Position: Sitting, Cuff Size: Normal)   Pulse 76   Temp 98.9 F (37.2 C) (Oral)   Ht 4\' 11"  (1.499 m)   Wt 126 lb 7 oz (57.4 kg)   SpO2 94%   BMI 25.54 kg/m    CC: chest pain Subjective:    Patient ID: Beth Bailey, female    DOB: [birth year], 73 y.o.   MRN: [id number]  HPI: Beth Bailey is a 73 y.o. female presenting on 01/10/2019 for Chest Pain (C/o pressure in right chest radiating down into upper abd. Says it felt like gas. Pain occured in abd with a deep breath. Sxs started yesterday. EMS called, pt not transported, examined at the house. Pt given EKG (provided a copy). Suggested sxs were GI related. Suggested Pepto Bismol, seemed to have helped. Pt accompanied by daughter, Sharyn Lull. )   See recent phone note.  R sided chest pressure "gas pressure" started yesterday with radiation to R upper abdomen. Burping did help relieve discomfort. Acute worsening overnight with sharp stabbing RUQ pain, EMS called to evaluate, symptoms did improve with hydrocodone, pepto bismol, and tums antacid. Did not go to ER. Today noticing dull persistent RUQ abd discomfort worse with certain movements or deep breaths. She did have some nausea yesterday but better today (treated with zofran).   No significant diet changes - had barbeque sandwich on Monday.  S/p cholecystectomy 2007. Appendix remains.  Had similar episode last year (n/v/upper abd pain) s/p ER eval with stable CT ?passed kidney stone.   No fevers/chills, jaundice, icterus, bowel changes, diarrhea/constipation. No urinary changes or hematuria, blood in stool. No significant GERD or heartburn symptoms.   She sowed grass on Saturday and may have overexerted back. H/o chronic back pain.   Brings EKG from yesterday by EMS - with diffuse T wave flattening and poor R wave progression - largely unchanged from prior.   Presumed HS without transaminitis.  GERD regularly on omeprazole 20mg  daily.   Seen here earlier in the month for physical. Familial hyperlipidemia - not tolerant of statins - we started atorvastatin MWF - seems to be tolerating well.  Zoloft is helping mood.      Relevant past medical, surgical, family and social history reviewed and updated as indicated. Interim medical history since our last visit reviewed. Allergies and medications reviewed and updated. Outpatient Medications Prior to Visit  Medication Sig Dispense Refill  . atorvastatin (LIPITOR) 40 MG tablet Take 1 tablet (40 mg total) by mouth every Monday, Wednesday, and Friday. 40 tablet 3  . Biotin 5000 MCG CAPS Take 5,000 mcg by mouth 2 (two) times a week.     . cetirizine (ZYRTEC) 10 MG tablet Take 10 mg by mouth at bedtime.     . gabapentin (NEURONTIN) 100 MG capsule Take 3 capsules (300 mg total) by mouth 2 (two) times daily.    Marland Kitchen HYDROcodone-acetaminophen (NORCO/VICODIN) 5-325 MG tablet TAKE 1 TABLET BY MOUTH EVERY 6 HOURS AS NEEDED FOR PAIN. (Patient taking differently: Take 1 tablet by mouth two times a day as needed for pain) 30 tablet 0  . LOTEMAX 0.5 % ophthalmic suspension Place 1 drop into the right eye at bedtime.   0  . Multiple Vitamin (MULTIVITAMIN WITH MINERALS) TABS tablet Take 1 tablet by mouth 2 (two) times a week.     . Omega-3 Fatty Acids (FISH OIL PO) Take 1 capsule by mouth daily.    Marland Kitchen omeprazole (PRILOSEC) 20 MG capsule  TAKE 1 CAPSULE BY MOUTH ONCE DAILY 90 capsule 1  . ondansetron (ZOFRAN ODT) 4 MG disintegrating tablet Take 1 tablet (4 mg total) by mouth every 8 (eight) hours as needed for nausea or vomiting. 12 tablet 0  . Polyethyl Glycol-Propyl Glycol 0.4-0.3 % SOLN Place 1 drop into both eyes 3 (three) times daily as needed (for dry eyes).     . sertraline (ZOLOFT) 100 MG tablet Take 1 tablet (100 mg total) by mouth daily. 90 tablet 3  . valACYclovir (VALTREX) 1000 MG tablet Take 1,000 mg by mouth at bedtime.     . vitamin B-12 (CYANOCOBALAMIN) 1000 MCG tablet Take 1,000 mcg by  mouth 3 (three) times a week.     . vitamin C (ASCORBIC ACID) 500 MG tablet Take 500 mg by mouth 3 (three) times a week.      No facility-administered medications prior to visit.      Per HPI unless specifically indicated in ROS section below Review of Systems Objective:    BP 130/68 (BP Location: Left Arm, Patient Position: Sitting, Cuff Size: Normal)   Pulse 76   Temp 98.9 F (37.2 C) (Oral)   Ht 4\' 11"  (1.499 m)   Wt 126 lb 7 oz (57.4 kg)   SpO2 94%   BMI 25.54 kg/m   Wt Readings from Last 3 Encounters:  01/10/19 126 lb 7 oz (57.4 kg)  12/20/18 128 lb (58.1 kg)  12/08/18 127 lb 4.8 oz (57.7 kg)    Physical Exam Vitals signs and nursing note reviewed.  Constitutional:      General: She is not in acute distress.    Appearance: Normal appearance. She is well-developed. She is not ill-appearing.  HENT:     Head: Normocephalic and atraumatic.     Mouth/Throat:     Mouth: Mucous membranes are moist.     Pharynx: No posterior oropharyngeal erythema.  Eyes:     Extraocular Movements: Extraocular movements intact.     Pupils: Pupils are equal, round, and reactive to light.  Cardiovascular:     Rate and Rhythm: Normal rate and regular rhythm.     Pulses: Normal pulses.     Heart sounds: Normal heart sounds. No murmur.  Pulmonary:     Effort: Pulmonary effort is normal. No respiratory distress.     Breath sounds: Normal breath sounds. No wheezing, rhonchi or rales.  Abdominal:     General: Abdomen is flat. Bowel sounds are normal. There is no distension.     Palpations: There is no mass.     Tenderness: There is generalized abdominal tenderness (moderate diffuse pain throughout predominantly epigastric, RUQ, RLQ). There is guarding. There is no right CVA tenderness, left CVA tenderness or rebound. Positive signs include Murphy's sign. Negative signs include psoas sign.     Hernia: No hernia is present.  Neurological:     Mental Status: She is alert.  Psychiatric:         Mood and Affect: Mood normal.       Results for orders placed or performed in visit on 01/10/19  CBC with Differential/Platelet  Result Value Ref Range   WBC 7.7 4.0 - 10.5 K/uL   RBC 4.41 3.87 - 5.11 Mil/uL   Hemoglobin 13.9 12.0 - 15.0 g/dL   HCT 41.1 36.0 - 46.0 %   MCV 93.2 78.0 - 100.0 fl   MCHC 33.8 30.0 - 36.0 g/dL   RDW 13.5 11.5 - 15.5 %   Platelets 163.0 150.0 -  400.0 K/uL   Neutrophils Relative % 61.3 43.0 - 77.0 %   Lymphocytes Relative 30.1 12.0 - 46.0 %   Monocytes Relative 6.5 3.0 - 12.0 %   Eosinophils Relative 1.5 0.0 - 5.0 %   Basophils Relative 0.6 0.0 - 3.0 %   Neutro Abs 4.7 1.4 - 7.7 K/uL   Lymphs Abs 2.3 0.7 - 4.0 K/uL   Monocytes Absolute 0.5 0.1 - 1.0 K/uL   Eosinophils Absolute 0.1 0.0 - 0.7 K/uL   Basophils Absolute 0.0 0.0 - 0.1 K/uL  Comprehensive metabolic panel  Result Value Ref Range   Sodium 139 135 - 145 mEq/L   Potassium 4.2 3.5 - 5.1 mEq/L   Chloride 100 96 - 112 mEq/L   CO2 29 19 - 32 mEq/L   Glucose, Bld 137 (H) 70 - 99 mg/dL   BUN 11 6 - 23 mg/dL   Creatinine, Ser 0.66 0.40 - 1.20 mg/dL   Total Bilirubin 0.5 0.2 - 1.2 mg/dL   Alkaline Phosphatase 66 39 - 117 U/L   AST 31 0 - 37 U/L   ALT 30 0 - 35 U/L   Total Protein 7.5 6.0 - 8.3 g/dL   Albumin 4.6 3.5 - 5.2 g/dL   Calcium 9.8 8.4 - 10.5 mg/dL   GFR 87.76 >60.00 mL/min  Lipase  Result Value Ref Range   Lipase 44.0 11.0 - 59.0 U/L  POCT Urinalysis Dipstick (Automated)  Result Value Ref Range   Color, UA yellow    Clarity, UA clear    Glucose, UA Negative Negative   Bilirubin, UA 1+    Ketones, UA negative    Spec Grav, UA 1.025 1.010 - 1.025   Blood, UA negative    pH, UA 6.0 5.0 - 8.0   Protein, UA Negative Negative   Urobilinogen, UA 2.0 (A) 0.2 or 1.0 E.U./dL   Nitrite, UA negative    Leukocytes, UA Moderate (2+) (A) Negative   Assessment & Plan:   Problem List Items Addressed This Visit    NAFLD (nonalcoholic fatty liver disease)   Relevant Orders   Ambulatory  referral to Gastroenterology   Lesion of spleen   IBS (irritable bowel syndrome)   Generalized abdominal pain - Primary    Predominantly R sided, of sudden onset last night. She does have guarding and mild rebound, I do think she will need urgent CT abd/pelvis to help r/o acute intra abdominal process, am suspicious for small gallstone causing problem. UA today with large bili and urobilinogen, micro without significant white cells pointing against infection. UCx sent. Will check labs (CBC, CMP, lipase). Pt and daughter agree with plan.  Story/exam not consistent with cardiac cause to R sided chest pressure.       Relevant Orders   CBC with Differential/Platelet (Completed)   Comprehensive metabolic panel (Completed)   Lipase (Completed)   POCT Urinalysis Dipstick (Automated) (Completed)   Urine Culture   CT Abdomen Pelvis W Contrast (Completed)   Ambulatory referral to Gastroenterology   Familial hyperlipidemia, high LDL    Seems to be tolerating MWF atorvastatin - continue.       Caregiver burden    Doing better on sertraline 100mg  - continue.           No orders of the defined types were placed in this encounter.  Orders Placed This Encounter  Procedures  . Urine Culture  . CT Abdomen Pelvis W Contrast    Standing Status:   Future  Number of Occurrences:   1    Standing Expiration Date:   04/10/2020    Order Specific Question:   ** REASON FOR EXAM (FREE TEXT)    Answer:   acute onset R sided abd pain    Order Specific Question:   If indicated for the ordered procedure, I authorize the administration of contrast media per Radiology protocol    Answer:   Yes    Order Specific Question:   Preferred imaging location?    Answer:   [facility]    Order Specific Question:   Is Oral Contrast requested for this exam?    Answer:   Yes, Per Radiology protocol    Order Specific Question:   Call Results- Best Contact Number?    Answer:   [phone]    Order Specific  Question:   Radiology Contrast Protocol - do NOT remove file path    Answer:   [email or web address]  . CBC with Differential/Platelet  . Comprehensive metabolic panel  . Lipase  . Ambulatory referral to Gastroenterology    Referral Priority:   Routine    Referral Type:   Consultation    Referral Reason:   Specialty Services Required    Number of Visits Requested:   1  . POCT Urinalysis Dipstick (Automated)    Patient Instructions  Urinalysis today.  Labs today.  Urine culture sent. I am suspicious for liver cause but given your ongoing tenderness let's check CT scan today. See Rosaria Ferries to get this scheduled.   Follow up plan: No follow-ups on file.  Ria Bush, MD

## 2019-01-11 ENCOUNTER — Other Ambulatory Visit: Payer: Self-pay

## 2019-01-11 ENCOUNTER — Telehealth: Payer: Self-pay | Admitting: Internal Medicine

## 2019-01-11 LAB — URINE CULTURE
MICRO NUMBER:: 368601
Result:: NO GROWTH
SPECIMEN QUALITY:: ADEQUATE

## 2019-01-11 NOTE — Telephone Encounter (Signed)
Patient contacted and scheduled for telephone visit due to Covid-19 on 01/15/19 3:00

## 2019-01-11 NOTE — Patient Outreach (Signed)
[facility]) Care Management  01/11/2019  Beth Bailey 1945/12/03 [id number]   Referral Date: 01/11/2019 Referral Source: Nurseline Referral Reason: Chest pain to right side   Outreach Attempt: spoke with patient. She is able to verify HIPAA.  She states that she still has some abdominal pain.  She states that the doctor seems to think there is something with her liver after the CT scan and that she will have a telephone visit with the GI doctor on Monday.  Patient states she has support of her daughter and that she has no food insecurities and no other needs at this time.     Plan: RN CM will close case.     Jone Baseman, RN, MSN [facility] Care Management Care Management Coordinator Direct Line [phone] Toll Free: [phone]  Fax: [phone]

## 2019-01-11 NOTE — Telephone Encounter (Signed)
We received an urgent referral from pt's PCP for evaluation of possible fatty liver. Pt has seen Dr. Carlean Purl in 2014, his first available is not until May and PCP is requesting a sooner appt. Pls advise on scheduling.

## 2019-01-12 ENCOUNTER — Encounter: Payer: Self-pay | Admitting: *Deleted

## 2019-01-15 ENCOUNTER — Ambulatory Visit (INDEPENDENT_AMBULATORY_CARE_PROVIDER_SITE_OTHER): Payer: PPO | Admitting: Internal Medicine

## 2019-01-15 ENCOUNTER — Encounter: Payer: Self-pay | Admitting: Internal Medicine

## 2019-01-15 ENCOUNTER — Other Ambulatory Visit: Payer: Self-pay

## 2019-01-15 DIAGNOSIS — Z86010 Personal history of colonic polyps: Secondary | ICD-10-CM | POA: Diagnosis not present

## 2019-01-15 DIAGNOSIS — R1011 Right upper quadrant pain: Secondary | ICD-10-CM | POA: Diagnosis not present

## 2019-01-15 DIAGNOSIS — K760 Fatty (change of) liver, not elsewhere classified: Secondary | ICD-10-CM | POA: Diagnosis not present

## 2019-01-15 DIAGNOSIS — Z636 Dependent relative needing care at home: Secondary | ICD-10-CM

## 2019-01-15 DIAGNOSIS — M5136 Other intervertebral disc degeneration, lumbar region: Secondary | ICD-10-CM

## 2019-01-15 NOTE — Patient Instructions (Addendum)
As we discussed the labs and CT scan do not show anything dangerous going on.  I am suspicious that this is a muscle strain - perhaps related to the fall you had recently. Or the sowing of grass seed.   It sounds like you are better and I hope you get all better soon.  If things change for the worse let us know, or Dr. Danise Mina.  We also discussed that you are overdue for a colonoscopy so once the Coronavirus restrictions are lifted and you are able to arrange please call back and schedule one.  I appreciate the opportunity to care for you. Gatha Mayer, MD, [facility]

## 2019-01-15 NOTE — Progress Notes (Signed)
TELEHEALTH ENCOUNTER IN SETTING OF COVID-19 PANDEMIC - REQUESTED BY PATIENT SERVICE PROVIDED BY TELEMEDECINE - TYPE: Telephone - audiovisual not possible PATIENT LOCATION: Home PATIENT HAS CONSENTED TO TELEHEALTH VISIT PROVIDER LOCATION: OFFICE   Beth Bailey 73 y.o. 02-05-1946 [id number]  Assessment & Plan:   Encounter Diagnoses  Name Primary?  . RUQ pain Yes  . Fatty liver   . DDD (degenerative disc disease), lumbar   . Hx of adenomatous colonic polyps   . Caregiver burden    Balance of evidence suggests a musculoskeletal process. Could have been related to fall and/or the sowing of grass seed. CT and labs very reassuring. Will observe  She was reassured  She plans to call and schedule routine colonoscopy after Covid-19 restrictions lift.  Call or see me prn otherwise   Subjective:   Chief Complaint:  RUQ pain HPI 73 yo ww w/ hx IBS and adenomatous colon polyps, she had a fall and struck right side sometime recently. On 3/31 started to have a pressure in RUQ that later intensified and was very painful going into 4/1. Sharp and stabbing.  Due to sxs in chest area was told by PCP triage to call 911, did so and did not go to hospital but was evaluated by Dr. Danise Mina. At that televisit she c/o less pain but worse w/ deep breath. She had sown grass the other day also. Cares for invalid husband and 19 yo mom - some lifting. Similar episode w/ nausea and vomiting also last year - ? Passed kidney stone  Labs and CT 4/1  UA mod leukocytes 2.0 urobilinogen, no blood/hgb CT fatty liver, lung nodule, liver cyst and hemangioma both stable, stable splenic hemangiomas,  o/w nothing acute  Other labs CBC, CMET, lipase NL  No bowel or bladder changes Is feeling better. Does notice pain with moving, twisiting and bending Is aware she is overdue for colonoscopy - recommended to have in 2017 but has not been able to take time due to caregiver burden  Has chronic low back pain  - says she needs a spinal fusion but has not done for same reasons has not done colonoscopy  GI ROS o/w negative  Allergies  Allergen Reactions  . Amoxicillin Other (See Comments)    Pt does not remember what the reaction was  . Atorvastatin Other (See Comments)    myalgia  . Diclofenac Sodium Swelling  . Erythromycin Swelling  . Penicillins Hives    Has patient had a PCN reaction causing immediate rash, facial/tongue/throat swelling, SOB or lightheadedness with hypotension: Yes Has patient had a PCN reaction causing severe rash involving mucus membranes or skin necrosis: No Has patient had a PCN reaction that required hospitalization No Has patient had a PCN reaction occurring within the last 10 years: No If all of the above answers are "NO", then may proceed with Cephalosporin use. Other reaction(s): Unknown, Unknown Unknown, rash possibly   . Pravastatin Other (See Comments)    myalgia  . Simvastatin Other (See Comments)    muscle pain  . Statins Other (See Comments)    Myalgias to simvastatin, pravastatin, lovastatin, atorvastatin  . Trifluridine Swelling  . Latex Rash  . Lisinopril Rash and Other (See Comments)    Hyperkalemia and achy bones also   Current Meds  Medication Sig  . atorvastatin (LIPITOR) 40 MG tablet Take 1 tablet (40 mg total) by mouth every Monday, Wednesday, and Friday.  . Biotin 5000 MCG CAPS Take 5,000 mcg by mouth  2 (two) times a week. As needed  . cetirizine (ZYRTEC) 10 MG tablet Take 10 mg by mouth at bedtime.   . gabapentin (NEURONTIN) 100 MG capsule Take 3 capsules (300 mg total) by mouth 2 (two) times daily.  Marland Kitchen HYDROcodone-acetaminophen (NORCO/VICODIN) 5-325 MG tablet TAKE 1 TABLET BY MOUTH EVERY 6 HOURS AS NEEDED FOR PAIN. (Patient taking differently: Take 1 tablet by mouth two times a day as needed for pain)  . LOTEMAX 0.5 % ophthalmic suspension Place 1 drop into the right eye at bedtime.   . Multiple Vitamin (MULTIVITAMIN WITH MINERALS) TABS  tablet Take 1 tablet by mouth 2 (two) times a week. As needed  . Omega-3 Fatty Acids (FISH OIL PO) Take 1 capsule by mouth daily.  Marland Kitchen omeprazole (PRILOSEC) 20 MG capsule TAKE 1 CAPSULE BY MOUTH ONCE DAILY  . ondansetron (ZOFRAN ODT) 4 MG disintegrating tablet Take 1 tablet (4 mg total) by mouth every 8 (eight) hours as needed for nausea or vomiting.  Vladimir Faster Glycol-Propyl Glycol 0.4-0.3 % SOLN Place 1 drop into both eyes 3 (three) times daily as needed (for dry eyes).   . sertraline (ZOLOFT) 100 MG tablet Take 1 tablet (100 mg total) by mouth daily.  . valACYclovir (VALTREX) 1000 MG tablet Take 1,000 mg by mouth at bedtime.   . vitamin B-12 (CYANOCOBALAMIN) 1000 MCG tablet Take 1,000 mcg by mouth 3 (three) times a week.   . vitamin C (ASCORBIC ACID) 500 MG tablet Take 500 mg by mouth 3 (three) times a week. As needed   Past Medical History:  Diagnosis Date  . Allergic rhinitis    prior on allergy shots  . Anxiety   . Arthritis   . Carotid stenosis 06/19/2016   R <40%, L 40-59%, rpt 1 yr (06/2016)  . Colon polyps   . GERD (gastroesophageal reflux disease)    controlled with PPI  . Herpes simplex of eye    right - blind s/p corneal transplant  . History of colon polyps   . History of hiatal hernia   . History of melanoma    on back [name])  . HLD (hyperlipidemia)    intolerant of statins  . HNP (herniated nucleus pulposus), lumbar 12/07/12   s/p surgery  . Hyperglycemia    pt is unsure about this  . Hypertension    has taken herself off her medications   . Lesion of spleen 2013   2cm, stable on recheck  . Malignant melanoma of right thigh ([place]) 03/29/2018   breslow thickness 0.14mm (Dr Veleta Miners)  . NAFLD (nonalcoholic fatty liver disease) 12/2013   by [place]  . Osteopenia 2016   dexa T -1.5 hip  . Pneumonia    Past Surgical History:  Procedure Laterality Date  . ABDOMINAL HYSTERECTOMY  1985   ovaries remain.  endometriosis, abnormal pap as well  . CARPAL TUNNEL RELEASE Bilateral    . CATARACT EXTRACTION Right 2013  . CHOLECYSTECTOMY  2007  . COLONOSCOPY  10/2010   polyp , mild divertic, int hem, rec rpt 5 yrs Carlean Purl)  . CORNEAL TRANSPLANT Right 2012  . dexa  2016   osteopenia T-1.5  . LUMBAR LAMINECTOMY/DECOMPRESSION MICRODISCECTOMY Right 12/07/2012   Procedure: LUMBAR LAMINECTOMY/DECOMPRESSION MICRODISCECTOMY 1 LEVEL;  Surgeon: Winfield Cunas, MD; Laterality: Right;  Right lumbar five-sacral one diskectomy  . NECK SURGERY  1990s   plate in neck  . TONSILLECTOMY     Social History   Social History Narrative   Lives with husband,  birds.  Grown dogs. One daughter - neighbor   Occupation: retired, worked at [employer]   Activity:planning on joining gym   Diet: fruits/vegetables daily, some water   1-2 caffeinated beverages daily   family history includes Alzheimer's disease in her father; Arthritis in an other family member; Cancer in her maternal uncle; Colon cancer (age of onset: 88) in her sister; Dementia in her maternal grandmother; Diabetes in her maternal grandmother; Heart attack in her maternal uncle; Kidney disease in her maternal uncle; Lung cancer in her paternal uncle; Parkinson's disease in her maternal grandfather; Stroke in her maternal grandmother.   Review of Systems As per HPI Otherwise all negative

## 2019-01-21 NOTE — Progress Notes (Signed)
I reviewed health advisor's note, was available for consultation, and agree with documentation and plan.  

## 2019-01-30 DIAGNOSIS — M5136 Other intervertebral disc degeneration, lumbar region: Secondary | ICD-10-CM | POA: Diagnosis not present

## 2019-04-13 ENCOUNTER — Encounter: Payer: Self-pay | Admitting: Family Medicine

## 2019-04-13 DIAGNOSIS — R911 Solitary pulmonary nodule: Secondary | ICD-10-CM | POA: Insufficient documentation

## 2019-05-29 DIAGNOSIS — M5126 Other intervertebral disc displacement, lumbar region: Secondary | ICD-10-CM | POA: Diagnosis not present

## 2019-05-29 DIAGNOSIS — M5416 Radiculopathy, lumbar region: Secondary | ICD-10-CM | POA: Diagnosis not present

## 2019-05-29 DIAGNOSIS — M5136 Other intervertebral disc degeneration, lumbar region: Secondary | ICD-10-CM | POA: Diagnosis not present

## 2019-05-29 DIAGNOSIS — M48062 Spinal stenosis, lumbar region with neurogenic claudication: Secondary | ICD-10-CM | POA: Diagnosis not present

## 2019-06-06 ENCOUNTER — Other Ambulatory Visit: Payer: Self-pay | Admitting: Family Medicine

## 2019-06-06 DIAGNOSIS — Z1231 Encounter for screening mammogram for malignant neoplasm of breast: Secondary | ICD-10-CM

## 2019-06-25 ENCOUNTER — Other Ambulatory Visit: Payer: Self-pay

## 2019-06-25 ENCOUNTER — Encounter: Payer: Self-pay | Admitting: Family Medicine

## 2019-06-25 ENCOUNTER — Ambulatory Visit (INDEPENDENT_AMBULATORY_CARE_PROVIDER_SITE_OTHER): Payer: PPO | Admitting: Family Medicine

## 2019-06-25 VITALS — BP 148/80 | HR 81 | Temp 97.9°F | Ht 59.0 in | Wt 124.6 lb

## 2019-06-25 DIAGNOSIS — E118 Type 2 diabetes mellitus with unspecified complications: Secondary | ICD-10-CM | POA: Diagnosis not present

## 2019-06-25 DIAGNOSIS — I10 Essential (primary) hypertension: Secondary | ICD-10-CM | POA: Diagnosis not present

## 2019-06-25 DIAGNOSIS — E7849 Other hyperlipidemia: Secondary | ICD-10-CM

## 2019-06-25 LAB — POCT GLYCOSYLATED HEMOGLOBIN (HGB A1C): Hemoglobin A1C: 6.3 % — AB (ref 4.0–5.6)

## 2019-06-25 NOTE — Progress Notes (Signed)
This visit was conducted in person.  BP (!) 148/80 (BP Location: Right Arm, Patient Position: Sitting, Cuff Size: Normal)   Pulse 81   Temp 97.9 F (36.6 C) (Temporal)   Ht 4\' 11"  (1.499 m)   Wt 124 lb 9 oz (56.5 kg)   SpO2 96%   BMI 25.16 kg/m   150/80 on recheck  CC: DM f/u visit Subjective:    Patient ID: Beth Bailey, female    DOB: 09/02/1946, 73 y.o.   MRN: [id number]  HPI: Beth Bailey is a 73 y.o. female presenting on 06/25/2019 for Diabetes (Here for 6 mo f/u. )   BP Readings from Last 3 Encounters:  06/25/19 (!) 148/80  01/10/19 130/68  12/20/18 136/72   Noticing increasing leg pains bilaterally. Pain started recently. She is on lipitor 40mg  MWF.   DM - does not regularly check sugars. Compliant with antihyperglycemic regimen which includes: diet controlled. Denies low sugars or hypoglycemic symptoms. Denies paresthesias. Last diabetic eye exam DUE. Pneumovax: 2017. Prevnar: 2015. Glucometer brand: doesn't have at home. DSME: went with her husband previously.  Lab Results  Component Value Date   HGBA1C 6.3 (A) 06/25/2019   Diabetic Foot Exam - Simple   Simple Foot Form Diabetic Foot exam was performed with the following findings: Yes 06/25/2019 12:16 PM  Visual Inspection No deformities, no ulcerations, no other skin breakdown bilaterally: Yes Sensation Testing See comments: Yes Pulse Check Posterior Tibialis and Dorsalis pulse intact bilaterally: Yes Comments Diminished sensation to monofilament on R post back surgery    Lab Results  Component Value Date   MICROALBUR 3.1 (H) 12/08/2018    Requests home health for husband - struggling caring for him. Will place referral. See his chart Percell Miller)     Relevant past medical, surgical, family and social history reviewed and updated as indicated. Interim medical history since our last visit reviewed. Allergies and medications reviewed and updated. Outpatient Medications Prior to Visit  Medication  Sig Dispense Refill  . atorvastatin (LIPITOR) 40 MG tablet Take 1 tablet (40 mg total) by mouth every Monday, Wednesday, and Friday. 40 tablet 3  . Biotin 5000 MCG CAPS Take 5,000 mcg by mouth 2 (two) times a week. As needed    . cetirizine (ZYRTEC) 10 MG tablet Take 10 mg by mouth at bedtime.     . gabapentin (NEURONTIN) 100 MG capsule Take 3 capsules (300 mg total) by mouth 2 (two) times daily.    Marland Kitchen HYDROcodone-acetaminophen (NORCO/VICODIN) 5-325 MG tablet TAKE 1 TABLET BY MOUTH EVERY 6 HOURS AS NEEDED FOR PAIN. (Patient taking differently: Take 1 tablet by mouth two times a day as needed for pain) 30 tablet 0  . LOTEMAX 0.5 % ophthalmic suspension Place 1 drop into the right eye at bedtime.   0  . Multiple Vitamin (MULTIVITAMIN WITH MINERALS) TABS tablet Take 1 tablet by mouth 2 (two) times a week. As needed    . Omega-3 Fatty Acids (FISH OIL PO) Take 1 capsule by mouth daily.    Marland Kitchen omeprazole (PRILOSEC) 20 MG capsule TAKE 1 CAPSULE BY MOUTH ONCE DAILY 90 capsule 1  . ondansetron (ZOFRAN ODT) 4 MG disintegrating tablet Take 1 tablet (4 mg total) by mouth every 8 (eight) hours as needed for nausea or vomiting. 12 tablet 0  . Polyethyl Glycol-Propyl Glycol 0.4-0.3 % SOLN Place 1 drop into both eyes 3 (three) times daily as needed (for dry eyes).     . sertraline (ZOLOFT) 100 MG tablet  Take 1 tablet (100 mg total) by mouth daily. 90 tablet 3  . valACYclovir (VALTREX) 1000 MG tablet Take 1,000 mg by mouth at bedtime.     . vitamin B-12 (CYANOCOBALAMIN) 1000 MCG tablet Take 1,000 mcg by mouth 3 (three) times a week.     . vitamin C (ASCORBIC ACID) 500 MG tablet Take 500 mg by mouth 3 (three) times a week. As needed     No facility-administered medications prior to visit.      Per HPI unless specifically indicated in ROS section below Review of Systems Objective:    BP (!) 148/80 (BP Location: Right Arm, Patient Position: Sitting, Cuff Size: Normal)   Pulse 81   Temp 97.9 F (36.6 C)  (Temporal)   Ht 4\' 11"  (1.499 m)   Wt 124 lb 9 oz (56.5 kg)   SpO2 96%   BMI 25.16 kg/m   Wt Readings from Last 3 Encounters:  06/25/19 124 lb 9 oz (56.5 kg)  01/10/19 126 lb 7 oz (57.4 kg)  12/20/18 128 lb (58.1 kg)    Physical Exam Vitals signs and nursing note reviewed.  Constitutional:      General: She is not in acute distress.    Appearance: Normal appearance. She is well-developed. She is not ill-appearing.  HENT:     Head: Normocephalic and atraumatic.     Right Ear: External ear normal.     Left Ear: External ear normal.     Nose: Nose normal.     Mouth/Throat:     Pharynx: No oropharyngeal exudate.  Eyes:     General: No scleral icterus.    Conjunctiva/sclera: Conjunctivae normal.     Pupils: Pupils are equal, round, and reactive to light.  Neck:     Musculoskeletal: Normal range of motion and neck supple.  Cardiovascular:     Rate and Rhythm: Normal rate and regular rhythm.     Heart sounds: Normal heart sounds. No murmur.  Pulmonary:     Effort: Pulmonary effort is normal. No respiratory distress.     Breath sounds: Normal breath sounds. No wheezing or rales.  Musculoskeletal:     Comments: See HPI for foot exam if done  Lymphadenopathy:     Cervical: No cervical adenopathy.  Skin:    General: Skin is warm and dry.     Findings: No rash.  Neurological:     Mental Status: She is alert.       Results for orders placed or performed in visit on 06/25/19  POCT glycosylated hemoglobin (Hb A1C)  Result Value Ref Range   Hemoglobin A1C 6.3 (A) 4.0 - 5.6 %   HbA1c POC (<> result, manual entry)     HbA1c, POC (prediabetic range)     HbA1c, POC (controlled diabetic range)     Assessment & Plan:   Problem List Items Addressed This Visit    Familial hyperlipidemia, high LDL    Continue atorvastatin MWF - noticing leg aches - will monitor for now, add CPK to next labwork.       Essential hypertension    Remains off meds. BP elevated today - she will start  monitoring at home and notify me if elevated.       Controlled diabetes mellitus type 2 with complications (HCC) - Primary    Chronic, improved based on A1c today. Congratulated. Continue diet controlled diabetes management.       Relevant Orders   POCT Urinalysis Dipstick (Automated)   POCT glycosylated  hemoglobin (Hb A1C) (Completed)       No orders of the defined types were placed in this encounter.  Orders Placed This Encounter  Procedures  . POCT Urinalysis Dipstick (Automated)  . POCT glycosylated hemoglobin (Hb A1C)    Patient Instructions  Sugars are doing well today! Let eye doctor know about diabetes - you may need diabetic eye exam next time you see them.  Return in 6 months for physical.  Blood pressure is too high - start monitoring at home and let me know if consistently >150/90 to consider starting medicine   Follow up plan: Return in about 6 months (around 12/23/2019) for medicare wellness visit, annual exam, prior fasting for blood work.  Ria Bush, MD

## 2019-06-25 NOTE — Assessment & Plan Note (Signed)
Remains off meds. BP elevated today - she will start monitoring at home and notify me if elevated.

## 2019-06-25 NOTE — Patient Instructions (Addendum)
Sugars are doing well today! Let eye doctor know about diabetes - you may need diabetic eye exam next time you see them.  Return in 6 months for physical.  Blood pressure is too high - start monitoring at home and let me know if consistently >150/90 to consider starting medicine

## 2019-06-25 NOTE — Assessment & Plan Note (Signed)
Continue atorvastatin MWF - noticing leg aches - will monitor for now, add CPK to next labwork.

## 2019-06-25 NOTE — Assessment & Plan Note (Signed)
Chronic, improved based on A1c today. Congratulated. Continue diet controlled diabetes management.

## 2019-07-24 ENCOUNTER — Other Ambulatory Visit: Payer: Self-pay

## 2019-07-24 ENCOUNTER — Ambulatory Visit
Admission: RE | Admit: 2019-07-24 | Discharge: 2019-07-24 | Disposition: A | Discharge status: Home / Self Care | Payer: PPO | Source: Ambulatory Visit | Attending: Family Medicine | Admitting: Family Medicine

## 2019-07-24 DIAGNOSIS — Z1231 Encounter for screening mammogram for malignant neoplasm of breast: Secondary | ICD-10-CM

## 2019-07-25 ENCOUNTER — Encounter: Payer: Self-pay | Admitting: Family Medicine

## 2019-07-25 LAB — HM MAMMOGRAPHY

## 2019-08-10 ENCOUNTER — Other Ambulatory Visit: Payer: Self-pay | Admitting: Family Medicine

## 2019-09-19 DIAGNOSIS — M48062 Spinal stenosis, lumbar region with neurogenic claudication: Secondary | ICD-10-CM | POA: Diagnosis not present

## 2019-09-19 DIAGNOSIS — M25551 Pain in right hip: Secondary | ICD-10-CM | POA: Diagnosis not present

## 2019-09-19 DIAGNOSIS — M545 Low back pain: Secondary | ICD-10-CM | POA: Diagnosis not present

## 2019-09-19 DIAGNOSIS — M5136 Other intervertebral disc degeneration, lumbar region: Secondary | ICD-10-CM | POA: Diagnosis not present

## 2019-09-19 DIAGNOSIS — M5126 Other intervertebral disc displacement, lumbar region: Secondary | ICD-10-CM | POA: Diagnosis not present

## 2019-09-19 DIAGNOSIS — M5416 Radiculopathy, lumbar region: Secondary | ICD-10-CM | POA: Diagnosis not present

## 2019-11-18 ENCOUNTER — Ambulatory Visit: Payer: PPO | Attending: Internal Medicine

## 2019-11-18 DIAGNOSIS — Z23 Encounter for immunization: Secondary | ICD-10-CM | POA: Insufficient documentation

## 2019-11-18 NOTE — Progress Notes (Signed)
   Covid-19 Vaccination Clinic  Name:  Beth Bailey    MRN: [id number] DOB: 03-29-1946  11/18/2019  Ms. Tureaud was observed post Covid-19 immunization for 15 minutes without incidence. She was provided with Vaccine Information Sheet and instruction to access the V-Safe system.   Ms. Hrbek was instructed to call 911 with any severe reactions post vaccine: Marland Kitchen Difficulty breathing  . Swelling of your face and throat  . A fast heartbeat  . A bad rash all over your body  . Dizziness and weakness    Immunizations Administered    Name Date Dose VIS Date Route   Pfizer COVID-19 Vaccine 11/18/2019 10:04 AM 0.3 mL 09/21/2019 Intramuscular   Manufacturer: [place]   Lot: [id number]   [place]: [id number]

## 2019-11-24 ENCOUNTER — Other Ambulatory Visit: Payer: Self-pay | Admitting: Family Medicine

## 2019-12-12 ENCOUNTER — Ambulatory Visit: Payer: PPO

## 2019-12-12 ENCOUNTER — Ambulatory Visit: Payer: PPO | Attending: Internal Medicine

## 2019-12-12 DIAGNOSIS — Z23 Encounter for immunization: Secondary | ICD-10-CM | POA: Insufficient documentation

## 2019-12-12 NOTE — Progress Notes (Signed)
   Covid-19 Vaccination Clinic  Name:  Beth Bailey    MRN: [id number] DOB: May 03, 1946  12/12/2019  Beth Bailey was observed post Covid-19 immunization for 15 minutes without incident. She was provided with Vaccine Information Sheet and instruction to access the V-Safe system.   Beth Bailey was instructed to call 911 with any severe reactions post vaccine: Marland Kitchen Difficulty breathing  . Swelling of face and throat  . A fast heartbeat  . A bad rash all over body  . Dizziness and weakness   Immunizations Administered    Name Date Dose VIS Date Route   Pfizer COVID-19 Vaccine 12/12/2019 11:49 AM 0.3 mL 09/21/2019 Intramuscular   Manufacturer: [place]   Lot: [id number]   [place]: [id number]

## 2019-12-18 ENCOUNTER — Encounter: Payer: Self-pay | Admitting: Family Medicine

## 2019-12-21 ENCOUNTER — Other Ambulatory Visit: Payer: PPO

## 2019-12-21 ENCOUNTER — Other Ambulatory Visit: Payer: Self-pay | Admitting: Family Medicine

## 2019-12-21 ENCOUNTER — Ambulatory Visit: Payer: PPO

## 2019-12-21 DIAGNOSIS — E7849 Other hyperlipidemia: Secondary | ICD-10-CM

## 2019-12-21 DIAGNOSIS — K760 Fatty (change of) liver, not elsewhere classified: Secondary | ICD-10-CM

## 2019-12-21 DIAGNOSIS — E118 Type 2 diabetes mellitus with unspecified complications: Secondary | ICD-10-CM

## 2019-12-26 ENCOUNTER — Encounter: Payer: Self-pay | Admitting: Family Medicine

## 2019-12-26 ENCOUNTER — Other Ambulatory Visit: Payer: Self-pay

## 2019-12-26 ENCOUNTER — Ambulatory Visit (INDEPENDENT_AMBULATORY_CARE_PROVIDER_SITE_OTHER): Payer: PPO | Admitting: Family Medicine

## 2019-12-26 VITALS — BP 158/82 | HR 82 | Temp 97.6°F | Ht <= 58 in | Wt 123.4 lb

## 2019-12-26 DIAGNOSIS — E7849 Other hyperlipidemia: Secondary | ICD-10-CM

## 2019-12-26 DIAGNOSIS — K219 Gastro-esophageal reflux disease without esophagitis: Secondary | ICD-10-CM

## 2019-12-26 DIAGNOSIS — Z7189 Other specified counseling: Secondary | ICD-10-CM

## 2019-12-26 DIAGNOSIS — I10 Essential (primary) hypertension: Secondary | ICD-10-CM

## 2019-12-26 DIAGNOSIS — K760 Fatty (change of) liver, not elsewhere classified: Secondary | ICD-10-CM

## 2019-12-26 DIAGNOSIS — M8580 Other specified disorders of bone density and structure, unspecified site: Secondary | ICD-10-CM

## 2019-12-26 DIAGNOSIS — E118 Type 2 diabetes mellitus with unspecified complications: Secondary | ICD-10-CM | POA: Diagnosis not present

## 2019-12-26 DIAGNOSIS — Z0000 Encounter for general adult medical examination without abnormal findings: Secondary | ICD-10-CM

## 2019-12-26 DIAGNOSIS — I6523 Occlusion and stenosis of bilateral carotid arteries: Secondary | ICD-10-CM

## 2019-12-26 DIAGNOSIS — M8949 Other hypertrophic osteoarthropathy, multiple sites: Secondary | ICD-10-CM

## 2019-12-26 DIAGNOSIS — M5126 Other intervertebral disc displacement, lumbar region: Secondary | ICD-10-CM

## 2019-12-26 DIAGNOSIS — R911 Solitary pulmonary nodule: Secondary | ICD-10-CM

## 2019-12-26 DIAGNOSIS — M159 Polyosteoarthritis, unspecified: Secondary | ICD-10-CM

## 2019-12-26 DIAGNOSIS — Z636 Dependent relative needing care at home: Secondary | ICD-10-CM

## 2019-12-26 DIAGNOSIS — Z85820 Personal history of malignant melanoma of skin: Secondary | ICD-10-CM

## 2019-12-26 LAB — COMPREHENSIVE METABOLIC PANEL
ALT: 15 U/L (ref 0–35)
AST: 19 U/L (ref 0–37)
Albumin: 4.2 g/dL (ref 3.5–5.2)
Alkaline Phosphatase: 53 U/L (ref 39–117)
BUN: 12 mg/dL (ref 6–23)
CO2: 32 mEq/L (ref 19–32)
Calcium: 9.6 mg/dL (ref 8.4–10.5)
Chloride: 102 mEq/L (ref 96–112)
Creatinine, Ser: 0.64 mg/dL (ref 0.40–1.20)
GFR: 90.7 mL/min (ref >60.00)
Glucose, Bld: 91 mg/dL (ref 70–99)
Potassium: 4.6 mEq/L (ref 3.5–5.1)
Sodium: 140 mEq/L (ref 135–145)
Total Bilirubin: 0.3 mg/dL (ref 0.2–1.2)
Total Protein: 6.9 g/dL (ref 6.0–8.3)

## 2019-12-26 LAB — LIPID PANEL
Cholesterol: 307 mg/dL — ABNORMAL HIGH (ref 0–200)
HDL: 52.8 mg/dL (ref >39.00)
NonHDL: 253.72
Total CHOL/HDL Ratio: 6
Triglycerides: 246 mg/dL — ABNORMAL HIGH (ref 0.0–149.0)
VLDL: 49.2 mg/dL — ABNORMAL HIGH (ref 0.0–40.0)

## 2019-12-26 LAB — MICROALBUMIN / CREATININE URINE RATIO
Creatinine,U: 86.6 mg/dL
Microalb Creat Ratio: 1.4 mg/g (ref 0.0–30.0)
Microalb, Ur: 1.2 mg/dL (ref 0.0–1.9)

## 2019-12-26 LAB — HEMOGLOBIN A1C: Hgb A1c MFr Bld: 6.5 % (ref 4.6–6.5)

## 2019-12-26 LAB — LDL CHOLESTEROL, DIRECT: Direct LDL: 202 mg/dL

## 2019-12-26 MED ORDER — SERTRALINE HCL 100 MG PO TABS: 100.0000 mg | ORAL_TABLET | Freq: Every day | ORAL | 3 refills | Status: DC

## 2019-12-26 MED ORDER — ATORVASTATIN CALCIUM 40 MG PO TABS: 40.0000 mg | ORAL_TABLET | ORAL | 3 refills | Status: DC

## 2019-12-26 MED ORDER — LOSARTAN POTASSIUM 25 MG PO TABS: 25.0000 mg | ORAL_TABLET | Freq: Every day | ORAL | 6 refills | Status: DC

## 2019-12-26 NOTE — Assessment & Plan Note (Signed)

## 2019-12-26 NOTE — Assessment & Plan Note (Signed)
Preventative protocols reviewed and updated unless pt declined. Discussed healthy diet and lifestyle.  

## 2019-12-26 NOTE — Patient Instructions (Signed)
Labs and urine today.  Bring [place] a copy of your advance directive.  Blood pressure is staying high - start losartan 25mg  daily.  Call GI to schedule colonoscopy.  I will order bone density scan.  If interested, check with pharmacy about new 2 shot shingles series (shingrix).  Return as needed or in 1 month for follow up visit.   Health Maintenance After Age 74 After age 71, you are at a higher risk for certain long-term diseases and infections as well as injuries from falls. Falls are a major cause of broken bones and head injuries in people who are older than age 22. Getting regular preventive care can help to keep you healthy and well. Preventive care includes getting regular testing and making lifestyle changes as recommended by your health care provider. Talk with your health care provider about:  Which screenings and tests you should have. A screening is a test that checks for a disease when you have no symptoms.  A diet and exercise plan that is right for you. What should I know about screenings and tests to prevent falls? Screening and testing are the best ways to find a health problem early. Early diagnosis and treatment give you the best chance of managing medical conditions that are common after age 47. Certain conditions and lifestyle choices may make you more likely to have a fall. Your health care provider may recommend:  Regular vision checks. Poor vision and conditions such as cataracts can make you more likely to have a fall. If you wear glasses, make sure to get your prescription updated if your vision changes.  Medicine review. Work with your health care provider to regularly review all of the medicines you are taking, including over-the-counter medicines. Ask your health care provider about any side effects that may make you more likely to have a fall. Tell your health care provider if any medicines that you take make you feel dizzy or sleepy.  Osteoporosis screening.  Osteoporosis is a condition that causes the bones to get weaker. This can make the bones weak and cause them to break more easily.  Blood pressure screening. Blood pressure changes and medicines to control blood pressure can make you feel dizzy.  Strength and balance checks. Your health care provider may recommend certain tests to check your strength and balance while standing, walking, or changing positions.  Foot health exam. Foot pain and numbness, as well as not wearing proper footwear, can make you more likely to have a fall.  Depression screening. You may be more likely to have a fall if you have a fear of falling, feel emotionally low, or feel unable to do activities that you used to do.  Alcohol use screening. Using too much alcohol can affect your balance and may make you more likely to have a fall. What actions can I take to lower my risk of falls? General instructions  Talk with your health care provider about your risks for falling. Tell your health care provider if: ? You fall. Be sure to tell your health care provider about all falls, even ones that seem minor. ? You feel dizzy, sleepy, or off-balance.  Take over-the-counter and prescription medicines only as told by your health care provider. These include any supplements.  Eat a healthy diet and maintain a healthy weight. A healthy diet includes low-fat dairy products, low-fat (lean) meats, and fiber from whole grains, beans, and lots of fruits and vegetables. Home safety  Remove any tripping hazards, such  as rugs, cords, and clutter.  Install safety equipment such as grab bars in bathrooms and safety rails on stairs.  Keep rooms and walkways well-lit. Activity   Follow a regular exercise program to stay fit. This will help you maintain your balance. Ask your health care provider what types of exercise are appropriate for you.  If you need a cane or walker, use it as recommended by your health care provider.  Wear  supportive shoes that have nonskid soles. Lifestyle  Do not drink alcohol if your health care provider tells you not to drink.  If you drink alcohol, limit how much you have: ? 0-1 drink a day for women. ? 0-2 drinks a day for men.  Be aware of how much alcohol is in your drink. In the [place] one drink equals one typical bottle of beer (12 oz), one-half glass of wine (5 oz), or one shot of hard liquor (1 oz).  Do not use any products that contain nicotine or tobacco, such as cigarettes and e-cigarettes. If you need help quitting, ask your health care provider. Summary  Having a healthy lifestyle and getting preventive care can help to protect your health and wellness after age 60.  Screening and testing are the best way to find a health problem early and help you avoid having a fall. Early diagnosis and treatment give you the best chance for managing medical conditions that are more common for people who are older than age 31.  Falls are a major cause of broken bones and head injuries in people who are older than age 63. Take precautions to prevent a fall at home.  Work with your health care provider to learn what changes you can make to improve your health and wellness and to prevent falls. This information is not intended to replace advice given to you by your health care provider. Make sure you discuss any questions you have with your health care provider. Document Revised: 01/18/2019 Document Reviewed: 08/10/2017 Elsevier Patient Education  2020 [employer].

## 2019-12-26 NOTE — Progress Notes (Signed)
This visit was conducted in person.  BP (!) 158/82 (BP Location: Right Arm, Patient Position: Sitting, Cuff Size: Normal)   Pulse 82   Temp 97.6 F (36.4 C) (Temporal)   Ht 4\' 10"  (1.473 m)   Wt 123 lb 6 oz (56 kg)   SpO2 96%   BMI 25.79 kg/m   BP Readings from Last 3 Encounters:  12/26/19 (!) 158/82  06/25/19 (!) 148/80  01/10/19 130/68  On repeat 180/80  CC: CPE Subjective:    Patient ID: Beth Bailey, female    DOB: 20-Sep-1946, 74 y.o.   MRN: [id number]  HPI: Beth Bailey is a 74 y.o. female presenting on 12/26/2019 for Medicare Wellness   Did not see health advisor this year.    Hearing Screening   125Hz  250Hz  500Hz  1000Hz  2000Hz  3000Hz  4000Hz  6000Hz  8000Hz   Right ear:   20 25 20  20     Left ear:   25 25 20  20       Visual Acuity Screening   Right eye Left eye Both eyes  Without correction:     With correction: 20/200 20/50 20/50  Comments: Had cornea transplant in right eye. Known vision loss - regularly sees eye doctor   Office Visit from 12/26/2019 in [place] at [facility] Total Score  0      Fall Risk  12/26/2019 12/08/2018 11/30/2017 05/28/2016 12/02/2014  Falls in the past year? 1 0 No No No  Comment Fell while assisting husband - - - -  Number falls in past yr: 0 - - - -  Injury with Fall? 0 - - - -      Husband passed away 01-02-2020 - hospitalized with severe infection ?meningitis in setting of vascular dementia. Hospice involved. They would have been married 88 yrs.  She continues caring for her mother.   Sees Dr Sharlet Salina for chronic back pain.Ongoing L leg pain.  HLD - ran out of atorvastatin - has not received refill. We never received request. I have refilled today.   Preventative: COLONOSCOPY 10/2010 - polyp, mild divertic, int hem, rec rpt 5 yrs Carlean Purl)  Last well woman with prior PCP 2013, s/p hysterectomy 1985, ovaries remain.Denies pelvic pain or pressure. No vaginal/vulvar skin changes.  Mammogram  07/2019 Birads1. Does breast examsat home.  Dexa -3/2016osteopenia T -1.5 at hip.  Flu - declines  Prevnar 08/2014. pneumovax -05/2016 Pfizer covid vaccine series completed 12/2019  Tetanus - declines zostavax - declines shingrix - discussed, declines  Advanced directives: has this set up. Daughter Kearney Hard) is HCPOA.Asked to bring [place] copy. Seat belt use discussed Sunscreen use discussed. No changing moles on skin. Due to see derm, in [place].  Non smoker Alcohol - none Dentist - due - has upper dentures  Eye exam yearly - due. H/o R corneal transplant Bowel - no constipation  Bladder - no incontinence   Lives with husbandwho she cares for (dementia), birds. Grown dogs. One daughter - neighbor Occupation: retired, worked at [employer]  Activity:no regular exercise, but walkingregularly - has membership to [employer] Diet: fruits/vegetables daily,goodwater     Relevant past medical, surgical, family and social history reviewed and updated as indicated. Interim medical history since our last visit reviewed. Allergies and medications reviewed and updated. Outpatient Medications Prior to Visit  Medication Sig Dispense Refill  . Biotin 5000 MCG CAPS Take 5,000 mcg by mouth 2 (two) times a week. As needed    . cetirizine (ZYRTEC) 10  MG tablet Take 10 mg by mouth at bedtime.     . gabapentin (NEURONTIN) 100 MG capsule Take 3 capsules (300 mg total) by mouth 2 (two) times daily.    Marland Kitchen HYDROcodone-acetaminophen (NORCO/VICODIN) 5-325 MG tablet TAKE 1 TABLET BY MOUTH EVERY 6 HOURS AS NEEDED FOR PAIN. (Patient taking differently: Take 1 tablet by mouth two times a day as needed for pain) 30 tablet 0  . LOTEMAX 0.5 % ophthalmic suspension Place 1 drop into the right eye at bedtime.   0  . Multiple Vitamin (MULTIVITAMIN WITH MINERALS) TABS tablet Take 1 tablet by mouth 2 (two) times a week. As needed    . Omega-3 Fatty Acids (FISH OIL PO) Take 1 capsule by mouth daily.    Marland Kitchen  omeprazole (PRILOSEC) 20 MG capsule Take 1 capsule by mouth once daily 90 capsule 3  . ondansetron (ZOFRAN ODT) 4 MG disintegrating tablet Take 1 tablet (4 mg total) by mouth every 8 (eight) hours as needed for nausea or vomiting. 12 tablet 0  . Polyethyl Glycol-Propyl Glycol 0.4-0.3 % SOLN Place 1 drop into both eyes 3 (three) times daily as needed (for dry eyes).     . valACYclovir (VALTREX) 1000 MG tablet Take 1,000 mg by mouth at bedtime.     . vitamin B-12 (CYANOCOBALAMIN) 1000 MCG tablet Take 1,000 mcg by mouth 3 (three) times a week.     . vitamin C (ASCORBIC ACID) 500 MG tablet Take 500 mg by mouth 3 (three) times a week. As needed    . atorvastatin (LIPITOR) 40 MG tablet Take 1 tablet (40 mg total) by mouth every Monday, Wednesday, and Friday. 40 tablet 3  . sertraline (ZOLOFT) 100 MG tablet Take 1 tablet by mouth once daily 90 tablet 0   No facility-administered medications prior to visit.     Per HPI unless specifically indicated in ROS section below Review of Systems  Constitutional: Negative for activity change, appetite change, chills, fatigue, fever and unexpected weight change.  HENT: Negative for hearing loss.   Eyes: Negative for visual disturbance.  Respiratory: Negative for cough, chest tightness, shortness of breath and wheezing.   Cardiovascular: Negative for chest pain, palpitations and leg swelling.       Ongoing L calf pain - present when in bed, worse with walking  Gastrointestinal: Negative for abdominal distention, abdominal pain, blood in stool, constipation, diarrhea, nausea and vomiting.  Genitourinary: Negative for difficulty urinating and hematuria.  Musculoskeletal: Negative for arthralgias, myalgias and neck pain.  Skin: Negative for rash.  Neurological: Positive for headaches. Negative for dizziness, seizures and syncope.  Hematological: Negative for adenopathy. Does not bruise/bleed easily.  Psychiatric/Behavioral: Negative for dysphoric mood. The  patient is not nervous/anxious.    Objective:    BP (!) 158/82 (BP Location: Right Arm, Patient Position: Sitting, Cuff Size: Normal)   Pulse 82   Temp 97.6 F (36.4 C) (Temporal)   Ht 4\' 10"  (1.473 m)   Wt 123 lb 6 oz (56 kg)   SpO2 96%   BMI 25.79 kg/m   Wt Readings from Last 3 Encounters:  12/26/19 123 lb 6 oz (56 kg)  06/25/19 124 lb 9 oz (56.5 kg)  01/10/19 126 lb 7 oz (57.4 kg)    Physical Exam Vitals and nursing note reviewed.  Constitutional:      General: She is not in acute distress.    Appearance: Normal appearance. She is well-developed. She is not ill-appearing.  HENT:     Head:  Normocephalic and atraumatic.     Right Ear: Hearing, tympanic membrane, ear canal and external ear normal.     Left Ear: Hearing, tympanic membrane, ear canal and external ear normal.     Mouth/Throat:     Pharynx: Uvula midline.  Eyes:     General: No scleral icterus.    Extraocular Movements: Extraocular movements intact.     Conjunctiva/sclera: Conjunctivae normal.     Pupils: Pupils are equal, round, and reactive to light.  Cardiovascular:     Rate and Rhythm: Normal rate and regular rhythm.     Pulses: Normal pulses.          Radial pulses are 2+ on the right side and 2+ on the left side.     Heart sounds: Normal heart sounds. No murmur.  Pulmonary:     Effort: Pulmonary effort is normal. No respiratory distress.     Breath sounds: Normal breath sounds. No wheezing, rhonchi or rales.  Abdominal:     General: Abdomen is flat. Bowel sounds are normal. There is no distension.     Palpations: Abdomen is soft. There is no mass.     Tenderness: There is no abdominal tenderness. There is no guarding or rebound.     Hernia: No hernia is present.  Musculoskeletal:        General: Normal range of motion.     Cervical back: Normal range of motion and neck supple.     Right lower leg: No edema.     Left lower leg: No edema.     Comments: 2+ DP bilaterally  Lymphadenopathy:      Cervical: No cervical adenopathy.  Skin:    General: Skin is warm and dry.     Findings: No rash.  Neurological:     General: No focal deficit present.     Mental Status: She is alert and oriented to person, place, and time.     Comments:  CN grossly intact, station and gait intact Recall 3/3 Calculation 2/5 DL-   Psychiatric:        Mood and Affect: Mood normal.        Behavior: Behavior normal.        Thought Content: Thought content normal.        Judgment: Judgment normal.       Results for orders placed or performed in visit on 07/25/19  HM MAMMOGRAPHY  Result Value Ref Range   HM Mammogram 0-4 Bi-Rad 0-4 Bi-Rad, Self Reported Normal  HM MAMMOGRAPHY  Result Value Ref Range   HM Mammogram 0-4 Bi-Rad 0-4 Bi-Rad, Self Reported Normal   Assessment & Plan:  This visit occurred during the SARS-CoV-2 public health emergency.  Safety protocols were in place, including screening questions prior to the visit, additional usage of staff PPE, and extensive cleaning of exam room while observing appropriate contact time as indicated for disinfecting solutions.   Problem List Items Addressed This Visit    Pulmonary nodule, right    Spoke with patient. Will check non contrasted lung CT.  She is low risk for lung cancer.       Relevant Orders   CT Chest Wo Contrast   Osteopenia    Encouraged continued calcium vit D and regular weight bearing exercise. Update DEXA.       Relevant Orders   DG Bone Density   Osteoarthritis   NAFLD (nonalcoholic fatty liver disease)    Check LFTs. Anticipate HS.  Medicare annual wellness visit, subsequent - Primary    I have personally reviewed the Medicare Annual Wellness questionnaire and have noted 1. The patient's medical and social history 2. Their use of alcohol, tobacco or illicit drugs 3. Their current medications and supplements 4. The patient's functional ability including ADL's, fall risks, home safety risks and hearing or visual  impairment. Cognitive function has been assessed and addressed as indicated.  5. Diet and physical activity 6. Evidence for depression or mood disorders The patients weight, height, BMI have been recorded in the chart. I have made referrals, counseling and provided education to the patient based on review of the above and I have provided the pt with a written personalized care plan for preventive services. Provider list updated.. See scanned questionairre as needed for further documentation. Reviewed preventative protocols and updated unless pt declined.       HNP (herniated nucleus pulposus), lumbar    She did not undergo surgery. Symptoms manageable at this time.       History of malignant melanoma of skin    H/o this. Overdue for derm eval.       Health maintenance examination    Preventative protocols reviewed and updated unless pt declined. Discussed healthy diet and lifestyle.       GERD    Continues omeprazole 20mg  daily.       Familial hyperlipidemia, high LDL    Continue atorvastatin MWF which she is tolerating much better. Update FLP on this regimen.  The 10-year ASCVD risk score Mikey Bussing DC Brooke Bonito., et al., 2013) is: 48.1%   Values used to calculate the score:     Age: 37 years     Sex: Female     Is Non-Hispanic African American: No     Diabetic: Yes     Tobacco smoker: No     Systolic Blood Pressure: 0000000 mmHg     Is BP treated: Yes     HDL Cholesterol: 54.7 mg/dL     Total Cholesterol: 283 mg/dL       Relevant Medications   atorvastatin (LIPITOR) 40 MG tablet   losartan (COZAAR) 25 MG tablet   Essential hypertension    Again deteriorated control. Advised start monitoring BP at home. Will start low dose losartan 25mg  daily. RTC 1 mo close f/u.       Relevant Medications   atorvastatin (LIPITOR) 40 MG tablet   losartan (COZAAR) 25 MG tablet   Controlled diabetes mellitus type 2 with complications (HCC)    Chronic, stable period. Update A1c, continue metformin.        Relevant Medications   atorvastatin (LIPITOR) 40 MG tablet   losartan (COZAAR) 25 MG tablet   Carotid stenosis    Mild on latest imaging.  Not appreciated on exam today.  Continue statin.       Relevant Medications   atorvastatin (LIPITOR) 40 MG tablet   losartan (COZAAR) 25 MG tablet   Caregiver burden    Continues sertraline 100mg  daily with benefit. Husband recently passed away. Continues caring for her mother.      Advanced care planning/counseling discussion    Advanced directives: has this set up. Daughter Kearney Hard) is HCPOA.Asked to bring [place] copy.          Meds ordered this encounter  Medications  . atorvastatin (LIPITOR) 40 MG tablet    Sig: Take 1 tablet (40 mg total) by mouth every Monday, Wednesday, and Friday.    Dispense:  40 tablet  Refill:  3  . sertraline (ZOLOFT) 100 MG tablet    Sig: Take 1 tablet (100 mg total) by mouth daily.    Dispense:  90 tablet    Refill:  3  . losartan (COZAAR) 25 MG tablet    Sig: Take 1 tablet (25 mg total) by mouth daily.    Dispense:  30 tablet    Refill:  6   Orders Placed This Encounter  Procedures  . DG Bone Density    Standing Status:   Future    Standing Expiration Date:   02/24/2021    Order Specific Question:   Reason for Exam (SYMPTOM  OR DIAGNOSIS REQUIRED)    Answer:   osteopenia    Order Specific Question:   Preferred imaging location?    Answer:   [facility]  . CT Chest Wo Contrast    Standing Status:   Future    Standing Expiration Date:   02/24/2021    Order Specific Question:   ** REASON FOR EXAM (FREE TEXT)    Answer:   first f/u CT for lung nodule noted 01/2019    Order Specific Question:   Preferred imaging location?    Answer:   [facility]    Order Specific Question:   Radiology Contrast Protocol - do NOT remove file path    Answer:   [email or web address]    Patient instructions: Labs and urine today.  Bring [place] a copy of your advance  directive.  Blood pressure is staying high - start losartan 25mg  daily.  Call GI to schedule colonoscopy.  I will order bone density scan.  If interested, check with pharmacy about new 2 shot shingles series (shingrix).  Return as needed or in 1 month for follow up visit.   Follow up plan: Return in about 4 weeks (around 01/23/2020) for follow up visit.  Ria Bush, MD

## 2019-12-26 NOTE — Assessment & Plan Note (Signed)
Check LFTs. Anticipate HS.

## 2019-12-26 NOTE — Assessment & Plan Note (Signed)
Again deteriorated control. Advised start monitoring BP at home. Will start low dose losartan 25mg  daily. RTC 1 mo close f/u.

## 2019-12-26 NOTE — Assessment & Plan Note (Signed)
Chronic, stable period. Update A1c, continue metformin.

## 2019-12-26 NOTE — Assessment & Plan Note (Signed)
Continues omeprazole 53m daily.

## 2019-12-26 NOTE — Assessment & Plan Note (Signed)
Advanced directives: has this set up. Daughter (Michelle Harris) is HCPOA. Asked to bring us copy. 

## 2019-12-26 NOTE — Assessment & Plan Note (Signed)
She did not undergo surgery. Symptoms manageable at this time.

## 2019-12-26 NOTE — Assessment & Plan Note (Signed)
Continue atorvastatin MWF which she is tolerating much better. Update FLP on this regimen.  The 10-year ASCVD risk score Beth Bailey DC Beth Bailey., et al., 2013) is: 48.1%   Values used to calculate the score:     Age: 74 years     Sex: Female     Is Non-Hispanic African American: No     Diabetic: Yes     Tobacco smoker: No     Systolic Blood Pressure: 0000000 mmHg     Is BP treated: Yes     HDL Cholesterol: 54.7 mg/dL     Total Cholesterol: 283 mg/dL

## 2019-12-26 NOTE — Assessment & Plan Note (Signed)
Mild on latest imaging.  Not appreciated on exam today.  Continue statin.

## 2019-12-26 NOTE — Assessment & Plan Note (Addendum)
Spoke with patient. Will check non contrasted lung CT.  She is low risk for lung cancer.

## 2019-12-26 NOTE — Assessment & Plan Note (Addendum)
Encouraged continued calcium vit D and regular weight bearing exercise. Update DEXA.

## 2019-12-26 NOTE — Assessment & Plan Note (Signed)
H/o this. Overdue for derm eval.

## 2019-12-26 NOTE — Assessment & Plan Note (Signed)
Continues sertraline 100mg  daily with benefit. Husband recently passed away. Continues caring for her mother.

## 2019-12-30 DIAGNOSIS — Z20822 Contact with and (suspected) exposure to covid-19: Secondary | ICD-10-CM | POA: Diagnosis not present

## 2019-12-30 DIAGNOSIS — Z20828 Contact with and (suspected) exposure to other viral communicable diseases: Secondary | ICD-10-CM | POA: Diagnosis not present

## 2020-01-04 ENCOUNTER — Encounter: Payer: Self-pay | Admitting: Emergency Medicine

## 2020-01-04 ENCOUNTER — Other Ambulatory Visit: Payer: Self-pay

## 2020-01-04 DIAGNOSIS — R0602 Shortness of breath: Secondary | ICD-10-CM | POA: Diagnosis not present

## 2020-01-04 DIAGNOSIS — Z91040 Latex allergy status: Secondary | ICD-10-CM | POA: Insufficient documentation

## 2020-01-04 DIAGNOSIS — Z20822 Contact with and (suspected) exposure to covid-19: Secondary | ICD-10-CM | POA: Insufficient documentation

## 2020-01-04 DIAGNOSIS — K760 Fatty (change of) liver, not elsewhere classified: Secondary | ICD-10-CM | POA: Diagnosis not present

## 2020-01-04 DIAGNOSIS — E119 Type 2 diabetes mellitus without complications: Secondary | ICD-10-CM | POA: Insufficient documentation

## 2020-01-04 DIAGNOSIS — I10 Essential (primary) hypertension: Secondary | ICD-10-CM | POA: Insufficient documentation

## 2020-01-04 DIAGNOSIS — Z79899 Other long term (current) drug therapy: Secondary | ICD-10-CM | POA: Diagnosis not present

## 2020-01-04 DIAGNOSIS — K5730 Diverticulosis of large intestine without perforation or abscess without bleeding: Secondary | ICD-10-CM | POA: Diagnosis not present

## 2020-01-04 DIAGNOSIS — J189 Pneumonia, unspecified organism: Secondary | ICD-10-CM | POA: Diagnosis not present

## 2020-01-04 DIAGNOSIS — R918 Other nonspecific abnormal finding of lung field: Secondary | ICD-10-CM | POA: Diagnosis not present

## 2020-01-04 DIAGNOSIS — R1011 Right upper quadrant pain: Secondary | ICD-10-CM | POA: Diagnosis present

## 2020-01-04 DIAGNOSIS — R079 Chest pain, unspecified: Secondary | ICD-10-CM | POA: Diagnosis not present

## 2020-01-04 NOTE — ED Triage Notes (Signed)
Pt reports developed sharp pain to RUQ this afternoon reports pain is intermittent, increases with movement. Denies any nausea, vomiting or diarrhea.  Normal BM last one 3/26. Pt talks in complete sentences no distress noted.

## 2020-01-05 ENCOUNTER — Emergency Department: Payer: PPO

## 2020-01-05 ENCOUNTER — Emergency Department
Admission: EM | Admit: 2020-01-05 | Discharge: 2020-01-05 | Disposition: A | Discharge status: Home / Self Care | Payer: PPO | Attending: Emergency Medicine | Admitting: Emergency Medicine

## 2020-01-05 DIAGNOSIS — K760 Fatty (change of) liver, not elsewhere classified: Secondary | ICD-10-CM | POA: Diagnosis not present

## 2020-01-05 DIAGNOSIS — R918 Other nonspecific abnormal finding of lung field: Secondary | ICD-10-CM | POA: Diagnosis not present

## 2020-01-05 DIAGNOSIS — K5730 Diverticulosis of large intestine without perforation or abscess without bleeding: Secondary | ICD-10-CM | POA: Diagnosis not present

## 2020-01-05 DIAGNOSIS — R079 Chest pain, unspecified: Secondary | ICD-10-CM | POA: Diagnosis not present

## 2020-01-05 DIAGNOSIS — J189 Pneumonia, unspecified organism: Secondary | ICD-10-CM | POA: Diagnosis not present

## 2020-01-05 DIAGNOSIS — R0602 Shortness of breath: Secondary | ICD-10-CM | POA: Diagnosis not present

## 2020-01-05 DIAGNOSIS — Z20822 Contact with and (suspected) exposure to covid-19: Secondary | ICD-10-CM | POA: Diagnosis not present

## 2020-01-05 LAB — URINALYSIS, COMPLETE (UACMP) WITH MICROSCOPIC
Bacteria, UA: NONE SEEN
Bilirubin Urine: NEGATIVE
Glucose, UA: NEGATIVE mg/dL
Hgb urine dipstick: NEGATIVE
Ketones, ur: NEGATIVE mg/dL
Nitrite: NEGATIVE
Protein, ur: NEGATIVE mg/dL
Specific Gravity, Urine: 1.016 (ref 1.005–1.030)
pH: 5 (ref 5.0–8.0)

## 2020-01-05 LAB — CBC
HCT: 38.8 % (ref 36.0–46.0)
Hemoglobin: 13 g/dL (ref 12.0–15.0)
MCH: 31.2 pg (ref 26.0–34.0)
MCHC: 33.5 g/dL (ref 30.0–36.0)
MCV: 93 fL (ref 80.0–100.0)
Platelets: 181 10*3/uL (ref 150–400)
RBC: 4.17 MIL/uL (ref 3.87–5.11)
RDW: 12.7 % (ref 11.5–15.5)
WBC: 11.5 10*3/uL — ABNORMAL HIGH (ref 4.0–10.5)
nRBC: 0 % (ref 0.0–0.2)

## 2020-01-05 LAB — COMPREHENSIVE METABOLIC PANEL
ALT: 18 U/L (ref 0–44)
AST: 25 U/L (ref 15–41)
Albumin: 4.4 g/dL (ref 3.5–5.0)
Alkaline Phosphatase: 56 U/L (ref 38–126)
Anion gap: 10 (ref 5–15)
BUN: 15 mg/dL (ref 8–23)
CO2: 28 mmol/L (ref 22–32)
Calcium: 9.5 mg/dL (ref 8.9–10.3)
Chloride: 101 mmol/L (ref 98–111)
Creatinine, Ser: 0.7 mg/dL (ref 0.44–1.00)
GFR calc Af Amer: >60 mL/min (ref >60)
GFR calc non Af Amer: >60 mL/min (ref >60)
Glucose, Bld: 163 mg/dL — ABNORMAL HIGH (ref 70–99)
Potassium: 3.7 mmol/L (ref 3.5–5.1)
Sodium: 139 mmol/L (ref 135–145)
Total Bilirubin: 0.6 mg/dL (ref 0.3–1.2)
Total Protein: 7.2 g/dL (ref 6.5–8.1)

## 2020-01-05 LAB — TROPONIN I (HIGH SENSITIVITY): Troponin I (High Sensitivity): 3 ng/L (ref <18)

## 2020-01-05 LAB — SARS CORONAVIRUS 2 (TAT 6-24 HRS): SARS Coronavirus 2: NEGATIVE

## 2020-01-05 MED ORDER — DOXYCYCLINE HYCLATE 100 MG PO TABS
100.0000 mg | ORAL_TABLET | Freq: Once | ORAL | Status: AC
  Administered 2020-01-05: 100 mg via ORAL
  Filled 2020-01-05: qty 1

## 2020-01-05 MED ORDER — IOHEXOL 350 MG/ML SOLN
75.0000 mL | Freq: Once | INTRAVENOUS | Status: AC | PRN
  Administered 2020-01-05: 75 mL via INTRAVENOUS

## 2020-01-05 MED ORDER — FENTANYL CITRATE (PF) 100 MCG/2ML IJ SOLN
50.0000 ug | Freq: Once | INTRAMUSCULAR | Status: AC
  Administered 2020-01-05: 50 ug via INTRAVENOUS
  Filled 2020-01-05: qty 2

## 2020-01-05 MED ORDER — SODIUM CHLORIDE 0.9 % IV SOLN
1.0000 g | Freq: Once | INTRAVENOUS | Status: AC
  Administered 2020-01-05: 1 g via INTRAVENOUS
  Filled 2020-01-05: qty 10

## 2020-01-05 MED ORDER — DOXYCYCLINE HYCLATE 100 MG PO CAPS: 100.0000 mg | ORAL_CAPSULE | Freq: Two times a day (BID) | ORAL | 0 refills | Status: AC

## 2020-01-05 MED ORDER — OXYCODONE-ACETAMINOPHEN 5-325 MG PO TABS
1.0000 | ORAL_TABLET | Freq: Once | ORAL | Status: AC
  Administered 2020-01-05: 1 via ORAL
  Filled 2020-01-05: qty 1

## 2020-01-05 MED ORDER — CEFPODOXIME PROXETIL 200 MG PO TABS: 200.0000 mg | ORAL_TABLET | Freq: Two times a day (BID) | ORAL | 0 refills | Status: AC

## 2020-01-05 MED ORDER — ONDANSETRON HCL 4 MG/2ML IJ SOLN
4.0000 mg | Freq: Once | INTRAMUSCULAR | Status: AC
  Administered 2020-01-05: 4 mg via INTRAVENOUS
  Filled 2020-01-05: qty 2

## 2020-01-05 NOTE — ED Provider Notes (Signed)
[facility] Emergency Department Provider Note  ____________________________________________  Time seen: Approximately 4:59 AM  I have reviewed the triage vital signs and the nursing notes.   HISTORY  Chief Complaint Abdominal Pain (RUQ)   HPI Beth Bailey is a 74 y.o. female with a history of GERD, hiatal hernia, hyperlipidemia, hypertension, melanoma, NAFLD, anxiety who presents for evaluation of right upper quadrant abdominal pain.  Patient reports that the pain started earlier today and got progressively worse.  The pain has been constant, sharp, worse with palpation of the right upper quadrant or with movement of her torso.  She denies any trauma, no nausea, no vomiting, no dysuria or hematuria, no shortness of breath or chest pain, no constipation or diarrhea, no cough or fever.  The pain is worse with deep inspiration.  No personal or family history of blood clots, no recent travel immobilization, no leg pain or swelling, no hemoptysis or exogenous hormones.  Patient has had her gallbladder removed several years ago.  The pain is moderate to severe.   Past Medical History:  Diagnosis Date  . Allergic rhinitis    prior on allergy shots  . Anxiety   . Arthritis   . Carotid stenosis 06/19/2016   R <40%, L 40-59%, rpt 1 yr (06/2016)  . Colon polyps   . GERD (gastroesophageal reflux disease)    controlled with PPI  . Herpes simplex of eye    right - blind s/p corneal transplant  . History of colon polyps   . History of hiatal hernia   . History of melanoma    on back Beth Bailey)  . HLD (hyperlipidemia)    intolerant of statins  . HNP (herniated nucleus pulposus), lumbar 12/07/12   s/p surgery  . Hyperglycemia    pt is unsure about this  . Hypertension    has taken herself off her medications   . Lesion of spleen 2013   2cm, stable on recheck  . Malignant melanoma of right thigh ([place]) 03/29/2018   breslow thickness 0.60mm (Dr Veleta Miners)  . NAFLD  (nonalcoholic fatty liver disease) 12/2013   by [place]  . Osteopenia 2016   dexa T -1.5 hip  . Pneumonia     Patient Active Problem List   Diagnosis Date Noted  . Pulmonary nodule, right 04/13/2019  . History of malignant melanoma of skin 03/29/2018  . Generalized abdominal pain 03/14/2018  . Oral mucosal lesion 12/02/2016  . Carotid stenosis 06/19/2016  . Health maintenance examination 05/31/2016  . Advanced care planning/counseling discussion 12/02/2014  . Lesion of spleen   . Caregiver burden 12/18/2013  . NAFLD (nonalcoholic fatty liver disease) 09/30/2013  . Medicare annual wellness visit, subsequent 09/25/2013  . Osteopenia   . IBS (irritable bowel syndrome) 08/29/2013  . Internal hemorrhoids with Grade 2 prolapse, itching, bleeding and soiling 08/29/2013  . Familial hyperlipidemia, high LDL   . History of colon polyps   . Herpes simplex of eye   . HNP (herniated nucleus pulposus), lumbar 12/07/2012  . Controlled diabetes mellitus type 2 with complications ([place]) [id number]  . Essential hypertension 02/23/2007  . GERD 02/23/2007  . Osteoarthritis 02/23/2007    Past Surgical History:  Procedure Laterality Date  . ABDOMINAL HYSTERECTOMY  1985   ovaries remain.  endometriosis, abnormal pap as well  . CARPAL TUNNEL RELEASE Bilateral   . CATARACT EXTRACTION Right 2013  . CHOLECYSTECTOMY  2007  . COLONOSCOPY  10/2010   polyp , mild divertic, int hem, rec  rpt 5 yrs Carlean Purl)  . CORNEAL TRANSPLANT Right 2012  . dexa  2016   osteopenia T-1.5  . LUMBAR LAMINECTOMY/DECOMPRESSION MICRODISCECTOMY Right 12/07/2012   Procedure: LUMBAR LAMINECTOMY/DECOMPRESSION MICRODISCECTOMY 1 LEVEL;  Surgeon: Winfield Cunas, MD; Laterality: Right;  Right lumbar five-sacral one diskectomy  . NECK SURGERY  1990s   plate in neck  . TONSILLECTOMY      Prior to Admission medications   Medication Sig Start Date End Date Taking? Authorizing Provider  atorvastatin (LIPITOR) 40 MG tablet Take 1 tablet  (40 mg total) by mouth every Monday, Wednesday, and Friday. 12/26/19   Ria Bush, MD  Biotin 5000 MCG CAPS Take 5,000 mcg by mouth 2 (two) times a week. As needed    [provider]  cefpodoxime (VANTIN) 200 MG tablet Take 1 tablet (200 mg total) by mouth 2 (two) times daily for 10 days. 01/05/20 01/15/20  Rudene Re, MD  cetirizine (ZYRTEC) 10 MG tablet Take 10 mg by mouth at bedtime.     [provider]  doxycycline (VIBRAMYCIN) 100 MG capsule Take 1 capsule (100 mg total) by mouth 2 (two) times daily for 10 days. 01/05/20 01/15/20  Rudene Re, MD  gabapentin (NEURONTIN) 100 MG capsule Take 3 capsules (300 mg total) by mouth 2 (two) times daily. 12/13/17   Ria Bush, MD  HYDROcodone-acetaminophen (NORCO/VICODIN) 5-325 MG tablet TAKE 1 TABLET BY MOUTH EVERY 6 HOURS AS NEEDED FOR PAIN. Patient taking differently: Take 1 tablet by mouth two times a day as needed for pain 12/23/15   Ria Bush, MD  losartan (COZAAR) 25 MG tablet Take 1 tablet (25 mg total) by mouth daily. 12/26/19   Ria Bush, MD  LOTEMAX 0.5 % ophthalmic suspension Place 1 drop into the right eye at bedtime.  01/09/18   [provider]  Multiple Vitamin (MULTIVITAMIN WITH MINERALS) TABS tablet Take 1 tablet by mouth 2 (two) times a week. As needed    [provider]  Omega-3 Fatty Acids (FISH OIL PO) Take 1 capsule by mouth daily.    [provider]  omeprazole (PRILOSEC) 20 MG capsule Take 1 capsule by mouth once daily 08/13/19   Ria Bush, MD  ondansetron (ZOFRAN ODT) 4 MG disintegrating tablet Take 1 tablet (4 mg total) by mouth every 8 (eight) hours as needed for nausea or vomiting. 03/09/18   Clifton James, MD  Polyethyl Glycol-Propyl Glycol 0.4-0.3 % SOLN Place 1 drop into both eyes 3 (three) times daily as needed (for dry eyes).     [provider]  sertraline (ZOLOFT) 100 MG tablet Take 1 tablet (100 mg total) by mouth daily.  12/26/19   Ria Bush, MD  valACYclovir (VALTREX) 1000 MG tablet Take 1,000 mg by mouth at bedtime.     [provider]  vitamin B-12 (CYANOCOBALAMIN) 1000 MCG tablet Take 1,000 mcg by mouth 3 (three) times a week.     [provider]  vitamin C (ASCORBIC ACID) 500 MG tablet Take 500 mg by mouth 3 (three) times a week. As needed    [provider]    Allergies Amoxicillin, Atorvastatin, Diclofenac sodium, Erythromycin, Penicillins, Pravastatin, Simvastatin, Statins, Trifluridine, Latex, and Lisinopril  Family History  Problem Relation Age of Onset  . Alzheimer's disease Father   . Arthritis Other   . Diabetes Maternal Grandmother   . Dementia Maternal Grandmother   . Stroke Maternal Grandmother   . Parkinson's disease Maternal Grandfather   . Colon cancer Sister 20  .  Cancer Maternal Uncle        questionable type  . Lung cancer Paternal Uncle   . Heart attack Maternal Uncle   . Kidney disease Maternal Uncle   . Liver disease Neg Hx   . Esophageal cancer Neg Hx   . Stomach cancer Neg Hx     Social History Social History   Tobacco Use  . Smoking status: Never Smoker  . Smokeless tobacco: Never Used  Substance Use Topics  . Alcohol use: No  . Drug use: No    Review of Systems  Constitutional: Negative for fever. Eyes: Negative for visual changes. ENT: Negative for sore throat. Neck: No neck pain  Cardiovascular: Negative for chest pain. Respiratory: Negative for shortness of breath. Gastrointestinal: +RUQ abdominal pain. No vomiting or diarrhea. Genitourinary: Negative for dysuria. Musculoskeletal: Negative for back pain. Skin: Negative for rash. Neurological: Negative for headaches, weakness or numbness. Psych: No SI or HI  ____________________________________________   PHYSICAL EXAM:  VITAL SIGNS: ED Triage Vitals  Enc Vitals Group     BP 01/04/20 2347 114/77     Pulse Rate 01/04/20 2347 75     Resp 01/04/20 2347 (!) 21       Temp 01/04/20 2347 97.8 F (36.6 C)     Temp Source 01/04/20 2347 Oral     SpO2 01/04/20 2347 98 %     Weight 01/04/20 2349 123 lb (55.8 kg)     Height 01/04/20 2349 4\' 11"  (1.499 m)     Head Circumference --      Peak Flow --      Pain Score 01/04/20 2349 8     Pain Loc --      Pain Edu? --      Excl. in [place]? --     Constitutional: Alert and oriented. Well appearing and in no apparent distress. HEENT:      Head: Normocephalic and atraumatic.         Eyes: Conjunctivae are normal. Sclera is non-icteric.       Mouth/Throat: Mucous membranes are moist.       Neck: Supple with no signs of meningismus. Cardiovascular: Regular rate and rhythm. No murmurs, gallops, or rubs. 2+ symmetrical distal pulses are present in all extremities. No JVD. Respiratory: Normal respiratory effort. Lungs are clear to auscultation bilaterally.  Gastrointestinal: Soft, tender to palpation over the RUQ, non distended with positive bowel sounds. No rebound or guarding. Genitourinary: No CVA tenderness. Musculoskeletal: No edema, cyanosis, or erythema of extremities. Neurologic: Normal speech and language. Face is symmetric. Moving all extremities. No gross focal neurologic deficits are appreciated. Skin: Skin is warm, dry and intact. No rash noted. Psychiatric: Mood and affect are normal. Speech and behavior are normal.  ____________________________________________   LABS (all labs ordered are listed, but only abnormal results are displayed)  Labs Reviewed  COMPREHENSIVE METABOLIC PANEL - Abnormal; Notable for the following components:      Result Value   Glucose, Bld 163 (*)    All other components within normal limits  CBC - Abnormal; Notable for the following components:   WBC 11.5 (*)    All other components within normal limits  URINALYSIS, COMPLETE (UACMP) WITH MICROSCOPIC - Abnormal; Notable for the following components:   Color, Urine YELLOW (*)    APPearance CLEAR (*)    Leukocytes,Ua  TRACE (*)    All other components within normal limits  SARS CORONAVIRUS 2 (TAT 6-24 HRS)  TROPONIN I (HIGH SENSITIVITY)  ____________________________________________  EKG  ED ECG REPORT I, Rudene Re, the attending physician, personally viewed and interpreted this ECG.  Normal sinus rhythm, rate of 74, normal intervals, normal axis, anterior Q waves, diffuse T wave flattening with no ST elevations or depressions.  Unchanged from prior. ____________________________________________  RADIOLOGY  I have personally reviewed the images performed during this visit and I agree with the Radiologist's read.   Interpretation by Radiologist:  DG Chest 2 View  Result Date: 01/05/2020 CLINICAL DATA:  Right upper quadrant pain EXAM: CHEST - 2 VIEW COMPARISON:  May 01, 2007 FINDINGS: The heart size and mediastinal contours are within normal limits. Streaky patchy airspace opacity seen at the right lung base. There is mild prominence of the pulmonary vasculature. Surgical clips in the right upper quadrant. No acute osseous abnormality. Cervical fixation hardware is seen. IMPRESSION: Streaky patchy airspace opacity at the right lung base which could be due to atelectasis or early infectious etiology. Pulmonary vascular congestion Electronically Signed   By: Prudencio Pair M.D.   On: 01/05/2020 02:44   CT Angio Chest PE W and/or Wo Contrast  Result Date: 01/05/2020 CLINICAL DATA:  Chest pain EXAM: CT ANGIOGRAPHY CHEST WITH CONTRAST TECHNIQUE: Multidetector CT imaging of the chest was performed using the standard protocol during bolus administration of intravenous contrast. Multiplanar CT image reconstructions and MIPs were obtained to evaluate the vascular anatomy. CONTRAST:  20mL OMNIPAQUE IOHEXOL 350 MG/ML SOLN COMPARISON:  CT same day, CT January 10, 2019 FINDINGS: Cardiovascular: There is a optimal opacification of the pulmonary arteries. There is no central,segmental, or subsegmental filling defects  within the pulmonary arteries. There is mild cardiomegaly. No pericardial effusion or thickening. No evidence right heart strain. There is a common origin of the left vertebral artery off the aortic arch. Scattered mild atherosclerosis is seen. Mediastinum/Nodes: No hilar, mediastinal, or axillary adenopathy. Thyroid gland, trachea, and esophagus demonstrate no significant findings. Lungs/Pleura: Mild bibasilar patchy ground-glass opacities are seen. There is also a 5 mm pulmonary nodule seen at the posterior right lung base which was seen dating back to 2019. No airspace consolidation. No pleural effusion is seen. Upper Abdomen: No acute abnormalities present in the visualized portions of the upper abdomen. Unchanged 5 cm low-density lesion in the all mid right hepatic lobe. Musculoskeletal: No chest wall abnormality. No acute or significant osseous findings. Review of the MIP images confirms the above findings. IMPRESSION: No central, segmental, or subsegmental pulmonary embolism. Mild cardiomegaly Streaky patchy airspace opacities at both lung bases which could be due to atelectasis or early infectious etiology. Electronically Signed   By: Prudencio Pair M.D.   On: 01/05/2020 03:50   CT Renal Stone Study  Result Date: 01/05/2020 CLINICAL DATA:  Unspecified abdominal pain. Right upper quadrant/right lower chest pain. EXAM: CT ABDOMEN AND PELVIS WITHOUT CONTRAST TECHNIQUE: Multidetector CT imaging of the abdomen and pelvis was performed following the standard protocol without IV contrast. COMPARISON:  Contrast-enhanced CT 01/10/2019 FINDINGS: Lower chest: Streaky and irregular opacities in both lung bases unchanged from prior exam likely related to scarring. Previous right lower lobe pulmonary nodule is not included in the field of view. No pleural fluid. Hepatobiliary: Unchanged 5.7 cm cyst in the central liver. Background hepatic steatosis. No evidence of new liver lesion. Calcified hepatic granuloma. Clips in  the gallbladder fossa postcholecystectomy. No biliary dilatation. Pancreas: No ductal dilatation or inflammation. Mild calcification of the distal body/tail. Spleen: Normal in size. Low-density splenic lesions are not as well visualized on the current  exam given lack of IV contrast. Adrenals/Urinary Tract: Normal adrenal glands. No renal stones. No hydronephrosis or perinephric edema. No ureteral calculi. Urinary bladder is mildly distended. Focus of air in the bladder may be related to instrumentation. No bladder stone or wall thickening. Stomach/Bowel: Stomach partially distended and unremarkable. Normal positioning of the ligament of Treitz. No small bowel obstruction or inflammation. Normal appendix. Colonic diverticulosis, prominent the sigmoid colon. No acute diverticulitis. Moderate stool burden proximally, small stool burden distally. Vascular/Lymphatic: Mild aortic atherosclerosis. No aortic aneurysm. No enlarged abdominopelvic lymph nodes. Reproductive: Status post hysterectomy. No adnexal masses. Other: No free air, free fluid, or intra-abdominal fluid collection. Tiny fat containing umbilical hernia. Musculoskeletal: Degenerative change in the lower lumbar spine with Modic endplate changes at 075-GRM and L5-S1. There are no acute or suspicious osseous abnormalities. IMPRESSION: 1. No acute abnormality in the abdomen/pelvis. No renal stones or obstructive uropathy. 2. Colonic diverticulosis without acute inflammation. 3. Hepatic steatosis. Unchanged hepatic cyst. 4. Focus of air in the urinary bladder may be related to instrumentation. No bladder wall thickening or inflammation. Aortic Atherosclerosis (ICD10-I70.0). Electronically Signed   By: Keith Rake M.D.   On: 01/05/2020 02:53     ____________________________________________   PROCEDURES  Procedure(s) performed: None Procedures Critical Care performed:  None ____________________________________________   INITIAL IMPRESSION /  ASSESSMENT AND PLAN / ED COURSE  74 y.o. female with a history of GERD, hiatal hernia, hyperlipidemia, hypertension, melanoma, NAFLD, anxiety who presents for evaluation of right upper quadrant abdominal pain.  Patient is in no obvious distress, she has normal vital signs, normal work of breathing normal sats, abdomen is soft with no distention and tender to palpation the right upper quadrant with negative Murphy sign.  Differential diagnoses including retained stone versus hepatitis versus pancreatitis versus diverticulitis versus kidney stone versus pyelonephritis versus PE versus pneumonia.   Labs showing mild leukocytosis with white count of 11.5.  UA negative for UTI or blood.  Chemistry panel within normal limits.  Troponin negative.  EKG with no ischemic changes.  Patient placed on telemetry for cardiac monitoring showing no abnormalities and normal sinus rhythm.  Initially sent for CT renal which was negative.  Chest x-ray was concerning for early infiltrate versus atelectasis on the right lower quadrant.  Patient was sent for CT to rule out pulmonary infarct in the setting of PE since her pain was pleuritic in nature.  CTA confirms early infiltrate.  No signs of sepsis or hypoxia.  Patient was given a dose of IV Rocephin and doxycycline.  She will be discharged home on Cefpodoxime and doxycycline with follow-up with PCP.  Covid is pending.  She received a dose of IV morphine for pain which was then switched to oral meds.  Pain well controlled in the emergency room.  Discussed my standard return precautions and follow-up.  I have reviewed patient's previous medical records and PMH.     _____________________________________________ Please note:  Patient was evaluated in Emergency Department today for the symptoms described in the history of present illness. Patient was evaluated in the context of the global COVID-19 pandemic, which necessitated consideration that the patient might be at risk for  infection with the SARS-CoV-2 virus that causes COVID-19. Institutional protocols and algorithms that pertain to the evaluation of patients at risk for COVID-19 are in a state of rapid change based on information released by regulatory bodies including the CDC and federal and state organizations. These policies and algorithms were followed during the patient's care in the  ED.  Some ED evaluations and interventions may be delayed as a result of limited staffing during the pandemic.   ____________________________________________   FINAL CLINICAL IMPRESSION(S) / ED DIAGNOSES   Final diagnoses:  Community acquired pneumonia of right lower lobe of lung      NEW MEDICATIONS STARTED DURING THIS VISIT:  ED Discharge Orders         Ordered    cefpodoxime (VANTIN) 200 MG tablet  2 times daily     01/05/20 0524    doxycycline (VIBRAMYCIN) 100 MG capsule  2 times daily     01/05/20 0524           Note:  This document was prepared using Dragon voice recognition software and may include unintentional dictation errors.    Alfred Levins, [place], MD 01/05/20 [phone]

## 2020-01-05 NOTE — Discharge Instructions (Addendum)

## 2020-01-08 ENCOUNTER — Other Ambulatory Visit: Payer: Self-pay

## 2020-01-08 ENCOUNTER — Ambulatory Visit (INDEPENDENT_AMBULATORY_CARE_PROVIDER_SITE_OTHER): Payer: PPO | Admitting: Family Medicine

## 2020-01-08 ENCOUNTER — Encounter: Payer: Self-pay | Admitting: Family Medicine

## 2020-01-08 VITALS — BP 160/86 | HR 73 | Temp 97.8°F | Ht <= 58 in | Wt 123.6 lb

## 2020-01-08 DIAGNOSIS — I10 Essential (primary) hypertension: Secondary | ICD-10-CM

## 2020-01-08 DIAGNOSIS — R911 Solitary pulmonary nodule: Secondary | ICD-10-CM

## 2020-01-08 DIAGNOSIS — J189 Pneumonia, unspecified organism: Secondary | ICD-10-CM | POA: Diagnosis not present

## 2020-01-08 NOTE — Patient Instructions (Addendum)
You are doing well today. Finish antibiotics. Continue probiotic after this.  Monitor blood pressures at home and update me with readings in another 2 weeks.

## 2020-01-08 NOTE — Progress Notes (Signed)
This visit was conducted in person.  BP (!) 160/86 (BP Location: Right Arm, Patient Position: Sitting, Cuff Size: Large)   Pulse 73   Temp 97.8 F (36.6 C) (Temporal)   Ht 4\' 10"  (1.473 m)   Wt 123 lb 9 oz (56 kg)   SpO2 97%   BMI 25.82 kg/m    CC: ER f/u visit  Subjective:    Patient ID: Beth Bailey, female    DOB: 12-Mar-1946, 74 y.o.   MRN: [id number]  HPI: Beth Bailey is a 74 y.o. female presenting on 01/08/2020 for Hospitalization Follow-up (Seen at [facility] ED on 01/05/20.  Pt accompanied by daughter, Sharyn Lull- temp 97.9.)   ER records reviewed with patient.  Recent ER evaluation for RUQ stabbing abd pain - found to have streaky patchy airspace opacity at R lung base ?early infection. Also had pulm vascular congestion. CTA chest again showed patchy airspace opacities at both lung bases ?atx vs early infectious etiology. CT renal stone - no acute process. WBC mildly elevated to 11.5. treated with IV rocephin and doxycycline, discharged on cefpodoxime and doxycycline. Covid negative.  No fever, cough, dyspnea.  Pain was pleuritic.   Since home feeling better. Tolerating antibiotic well. Has started taking probiotic.  This is first time she's had pneumonia. She is managing pain with tylenol/advil alternating. Sleeping well at night. Known chronic back pain.   High stress recently: Husband recently passed. Mother just broke her hip - in rehab at nursing home. Mother tested positive for covid. Patient has been tested twice and negative for covid both times.  Daughter will start managing meds for patient.      Relevant past medical, surgical, family and social history reviewed and updated as indicated. Interim medical history since our last visit reviewed. Allergies and medications reviewed and updated. Outpatient Medications Prior to Visit  Medication Sig Dispense Refill  . atorvastatin (LIPITOR) 40 MG tablet Take 1 tablet (40 mg total) by mouth every  Monday, Wednesday, and Friday. 40 tablet 3  . Biotin 5000 MCG CAPS Take 5,000 mcg by mouth 2 (two) times a week. As needed    . cefpodoxime (VANTIN) 200 MG tablet Take 1 tablet (200 mg total) by mouth 2 (two) times daily for 10 days. 20 tablet 0  . cetirizine (ZYRTEC) 10 MG tablet Take 10 mg by mouth at bedtime.     Marland Kitchen doxycycline (VIBRAMYCIN) 100 MG capsule Take 1 capsule (100 mg total) by mouth 2 (two) times daily for 10 days. 20 capsule 0  . gabapentin (NEURONTIN) 100 MG capsule Take 3 capsules (300 mg total) by mouth 2 (two) times daily.    Marland Kitchen HYDROcodone-acetaminophen (NORCO/VICODIN) 5-325 MG tablet TAKE 1 TABLET BY MOUTH EVERY 6 HOURS AS NEEDED FOR PAIN. (Patient taking differently: Take 1 tablet by mouth two times a day as needed for pain) 30 tablet 0  . losartan (COZAAR) 25 MG tablet Take 1 tablet (25 mg total) by mouth daily. 30 tablet 6  . LOTEMAX 0.5 % ophthalmic suspension Place 1 drop into the right eye at bedtime.   0  . Multiple Vitamin (MULTIVITAMIN WITH MINERALS) TABS tablet Take 1 tablet by mouth 2 (two) times a week. As needed    . Omega-3 Fatty Acids (FISH OIL PO) Take 1 capsule by mouth daily.    Marland Kitchen omeprazole (PRILOSEC) 20 MG capsule Take 1 capsule by mouth once daily 90 capsule 3  . ondansetron (ZOFRAN ODT) 4 MG disintegrating tablet Take 1 tablet (4  mg total) by mouth every 8 (eight) hours as needed for nausea or vomiting. 12 tablet 0  . Polyethyl Glycol-Propyl Glycol 0.4-0.3 % SOLN Place 1 drop into both eyes 3 (three) times daily as needed (for dry eyes).     . sertraline (ZOLOFT) 100 MG tablet Take 1 tablet (100 mg total) by mouth daily. 90 tablet 3  . valACYclovir (VALTREX) 1000 MG tablet Take 1,000 mg by mouth at bedtime.     . vitamin B-12 (CYANOCOBALAMIN) 1000 MCG tablet Take 1,000 mcg by mouth 3 (three) times a week.     . vitamin C (ASCORBIC ACID) 500 MG tablet Take 500 mg by mouth 3 (three) times a week. As needed     No facility-administered medications prior to  visit.     Per HPI unless specifically indicated in ROS section below Review of Systems Objective:    BP (!) 160/86 (BP Location: Right Arm, Patient Position: Sitting, Cuff Size: Large)   Pulse 73   Temp 97.8 F (36.6 C) (Temporal)   Ht 4\' 10"  (1.473 m)   Wt 123 lb 9 oz (56 kg)   SpO2 97%   BMI 25.82 kg/m   Wt Readings from Last 3 Encounters:  01/08/20 123 lb 9 oz (56 kg)  01/04/20 123 lb (55.8 kg)  12/26/19 123 lb 6 oz (56 kg)    Physical Exam Vitals and nursing note reviewed.  Constitutional:      Appearance: Normal appearance. She is not ill-appearing.  Cardiovascular:     Rate and Rhythm: Normal rate and regular rhythm.     Pulses: Normal pulses.     Heart sounds: Normal heart sounds. No murmur.  Pulmonary:     Effort: Pulmonary effort is normal. No respiratory distress.     Breath sounds: No wheezing, rhonchi or rales.     Comments: Slightly coarse RLL Musculoskeletal:     Right lower leg: No edema.     Left lower leg: No edema.  Skin:    General: Skin is warm and dry.     Findings: No rash.  Neurological:     Mental Status: She is alert.  Psychiatric:        Mood and Affect: Mood normal.        Behavior: Behavior normal.       Results for orders placed or performed during the hospital encounter of 01/05/20  SARS CORONAVIRUS 2 (TAT 6-24 HRS) Nasopharyngeal Nasopharyngeal Swab   Specimen: Nasopharyngeal Swab  Result Value Ref Range   SARS Coronavirus 2 NEGATIVE NEGATIVE  Comprehensive metabolic panel  Result Value Ref Range   Sodium 139 135 - 145 mmol/L   Potassium 3.7 3.5 - 5.1 mmol/L   Chloride 101 98 - 111 mmol/L   CO2 28 22 - 32 mmol/L   Glucose, Bld 163 (H) 70 - 99 mg/dL   BUN 15 8 - 23 mg/dL   Creatinine, Ser 0.70 0.44 - 1.00 mg/dL   Calcium 9.5 8.9 - 10.3 mg/dL   Total Protein 7.2 6.5 - 8.1 g/dL   Albumin 4.4 3.5 - 5.0 g/dL   AST 25 15 - 41 U/L   ALT 18 0 - 44 U/L   Alkaline Phosphatase 56 38 - 126 U/L   Total Bilirubin 0.6 0.3 - 1.2 mg/dL    GFR calc non Af Amer >60 >60 mL/min   GFR calc Af Amer >60 >60 mL/min   Anion gap 10 5 - 15  CBC  Result Value Ref Range  WBC 11.5 (H) 4.0 - 10.5 K/uL   RBC 4.17 3.87 - 5.11 MIL/uL   Hemoglobin 13.0 12.0 - 15.0 g/dL   HCT 38.8 36.0 - 46.0 %   MCV 93.0 80.0 - 100.0 fL   MCH 31.2 26.0 - 34.0 pg   MCHC 33.5 30.0 - 36.0 g/dL   RDW 12.7 11.5 - 15.5 %   Platelets 181 150 - 400 K/uL   nRBC 0.0 0.0 - 0.2 %  Urinalysis, Complete w Microscopic  Result Value Ref Range   Color, Urine YELLOW (A) YELLOW   APPearance CLEAR (A) CLEAR   Specific Gravity, Urine 1.016 1.005 - 1.030   pH 5.0 5.0 - 8.0   Glucose, UA NEGATIVE NEGATIVE mg/dL   Hgb urine dipstick NEGATIVE NEGATIVE   Bilirubin Urine NEGATIVE NEGATIVE   Ketones, ur NEGATIVE NEGATIVE mg/dL   Protein, ur NEGATIVE NEGATIVE mg/dL   Nitrite NEGATIVE NEGATIVE   Leukocytes,Ua TRACE (A) NEGATIVE   RBC / HPF 0-5 0 - 5 RBC/hpf   WBC, UA 6-10 0 - 5 WBC/hpf   Bacteria, UA NONE SEEN NONE SEEN   Squamous Epithelial / LPF 0-5 0 - 5   Mucus PRESENT   Troponin I (High Sensitivity)  Result Value Ref Range   Troponin I (High Sensitivity) 3 <18 ng/L   Assessment & Plan:  This visit occurred during the SARS-CoV-2 public health emergency.  Safety protocols were in place, including screening questions prior to the visit, additional usage of staff PPE, and extensive cleaning of exam room while observing appropriate contact time as indicated for disinfecting solutions.   Problem List Items Addressed This Visit    Pulmonary nodule, right    Recent CTA showed stable 59mm RLL nodule - will cancel planned CT next month and next imaging study would be due in 2 years.       Essential hypertension    Elevated readings recently - in setting of recently starting losartan - will give med some more time for full effect, advised she continue monitoring BP at home and let me know if persistently elevated to titrated medication. Pt and daughter agree with plan.         Community acquired pneumonia of right lower lobe of lung - Primary    Completing antibiotic treatment with benefit. Never had significant cough or fever, just presented with pleuritic R chest and RUQ abd pain.           No orders of the defined types were placed in this encounter.  No orders of the defined types were placed in this encounter.  Patient Instructions  You are doing well today. Finish antibiotics. Continue probiotic after this.  Monitor blood pressures at home and update me with readings in another 2 weeks.    Follow up plan: Return if symptoms worsen or fail to improve.  Ria Bush, MD

## 2020-01-09 ENCOUNTER — Telehealth: Payer: Self-pay | Admitting: Family Medicine

## 2020-01-09 DIAGNOSIS — J189 Pneumonia, unspecified organism: Secondary | ICD-10-CM

## 2020-01-09 HISTORY — DX: Pneumonia, unspecified organism: J18.9

## 2020-01-09 NOTE — Telephone Encounter (Signed)
We had planned rpt lung CT for 01/2020 for RLL pulm nodule seen on CT 01/2019.  However, recent CTA showed a stable nodule.  Would recommend cancelling planned chest CT 01/2020 and next chest CT would be due in 2 years.  Will cc to Lattie Haw to notify pt and Rosaria Ferries to cancel CT 01/2020.

## 2020-01-09 NOTE — Assessment & Plan Note (Signed)
Elevated readings recently - in setting of recently starting losartan - will give med some more time for full effect, advised she continue monitoring BP at home and let me know if persistently elevated to titrated medication. Pt and daughter agree with plan.

## 2020-01-09 NOTE — Assessment & Plan Note (Signed)
Completing antibiotic treatment with benefit. Never had significant cough or fever, just presented with pleuritic R chest and RUQ abd pain.

## 2020-01-09 NOTE — Telephone Encounter (Signed)
Spoke with pt relaying Dr. Synthia Innocent message.  Pt verbalizes understanding and agrees to c/x CT.  FYI to Dr. Darnell Level and Rosaria Ferries.

## 2020-01-09 NOTE — Telephone Encounter (Signed)
CT Scan canceled.

## 2020-01-09 NOTE — Assessment & Plan Note (Signed)
Recent CTA showed stable 10mm RLL nodule - will cancel planned CT next month and next imaging study would be due in 2 years.

## 2020-01-12 ENCOUNTER — Other Ambulatory Visit: Payer: Self-pay | Admitting: Family Medicine

## 2020-01-12 DIAGNOSIS — Z789 Other specified health status: Secondary | ICD-10-CM | POA: Insufficient documentation

## 2020-01-12 DIAGNOSIS — E7849 Other hyperlipidemia: Secondary | ICD-10-CM

## 2020-01-12 MED ORDER — ATORVASTATIN CALCIUM 40 MG PO TABS: 40.0000 mg | ORAL_TABLET | Freq: Every day | ORAL | 3 refills | Status: DC

## 2020-01-14 ENCOUNTER — Ambulatory Visit: Payer: PPO

## 2020-01-23 ENCOUNTER — Ambulatory Visit: Payer: PPO | Admitting: Family Medicine

## 2020-02-04 ENCOUNTER — Telehealth: Payer: Self-pay | Admitting: Family Medicine

## 2020-02-04 NOTE — Telephone Encounter (Signed)
Patient called to cancel her Bone Density scan for tomorrow, stated she has a lot of things going on right now  Patient would like to reschedule.

## 2020-02-05 ENCOUNTER — Other Ambulatory Visit: Payer: PPO

## 2020-02-07 ENCOUNTER — Other Ambulatory Visit: Payer: Self-pay

## 2020-02-07 NOTE — Telephone Encounter (Signed)
Gabapentin last rx:  12/13/17 Valacyclovir Last OV:  01/08/20, hosp f/u Next OV:  02/13/20, 4 wk HTN f/u

## 2020-02-08 MED ORDER — GABAPENTIN 300 MG PO CAPS: 300.0000 mg | ORAL_CAPSULE | Freq: Two times a day (BID) | ORAL | 6 refills | Status: DC

## 2020-02-08 MED ORDER — VALACYCLOVIR HCL 1 G PO TABS: 1000.0000 mg | ORAL_TABLET | Freq: Every day | ORAL | 0 refills | Status: DC

## 2020-02-13 ENCOUNTER — Other Ambulatory Visit: Payer: Self-pay

## 2020-02-13 ENCOUNTER — Encounter: Payer: Self-pay | Admitting: Family Medicine

## 2020-02-13 ENCOUNTER — Ambulatory Visit (INDEPENDENT_AMBULATORY_CARE_PROVIDER_SITE_OTHER): Payer: PPO | Admitting: Family Medicine

## 2020-02-13 VITALS — BP 136/70 | HR 78 | Temp 97.9°F | Ht <= 58 in | Wt 123.4 lb

## 2020-02-13 DIAGNOSIS — Z789 Other specified health status: Secondary | ICD-10-CM

## 2020-02-13 DIAGNOSIS — M5126 Other intervertebral disc displacement, lumbar region: Secondary | ICD-10-CM | POA: Diagnosis not present

## 2020-02-13 DIAGNOSIS — I10 Essential (primary) hypertension: Secondary | ICD-10-CM | POA: Diagnosis not present

## 2020-02-13 DIAGNOSIS — M8949 Other hypertrophic osteoarthropathy, multiple sites: Secondary | ICD-10-CM | POA: Diagnosis not present

## 2020-02-13 DIAGNOSIS — Z636 Dependent relative needing care at home: Secondary | ICD-10-CM

## 2020-02-13 DIAGNOSIS — E7849 Other hyperlipidemia: Secondary | ICD-10-CM | POA: Diagnosis not present

## 2020-02-13 DIAGNOSIS — R413 Other amnesia: Secondary | ICD-10-CM | POA: Diagnosis not present

## 2020-02-13 DIAGNOSIS — M159 Polyosteoarthritis, unspecified: Secondary | ICD-10-CM

## 2020-02-13 LAB — BASIC METABOLIC PANEL
BUN: 13 mg/dL (ref 6–23)
CO2: 32 mEq/L (ref 19–32)
Calcium: 9.9 mg/dL (ref 8.4–10.5)
Chloride: 103 mEq/L (ref 96–112)
Creatinine, Ser: 0.72 mg/dL (ref 0.40–1.20)
GFR: 79.14 mL/min (ref >60.00)
Glucose, Bld: 110 mg/dL — ABNORMAL HIGH (ref 70–99)
Potassium: 5.2 mEq/L — ABNORMAL HIGH (ref 3.5–5.1)
Sodium: 140 mEq/L (ref 135–145)

## 2020-02-13 LAB — VITAMIN B12: Vitamin B-12: 361 pg/mL (ref 211–911)

## 2020-02-13 LAB — T4, FREE: Free T4: 1.16 ng/dL (ref 0.60–1.60)

## 2020-02-13 LAB — TSH: TSH: 0.87 u[IU]/mL (ref 0.35–4.50)

## 2020-02-13 MED ORDER — LOSARTAN POTASSIUM 25 MG PO TABS: 25.0000 mg | ORAL_TABLET | Freq: Every day | ORAL | 3 refills | Status: DC

## 2020-02-13 NOTE — Assessment & Plan Note (Signed)
Chronic, significantly improved control. Continue current regimen. Check BMP today.

## 2020-02-13 NOTE — Assessment & Plan Note (Signed)
Chronic back pain limits ambulation - requests handicap placard

## 2020-02-13 NOTE — Assessment & Plan Note (Signed)
Seems to be tolerating atorvastatin 40mg  daily - continue.

## 2020-02-13 NOTE — Progress Notes (Signed)
This visit was conducted in person.  BP 136/70 (BP Location: Left Arm, Patient Position: Sitting, Cuff Size: Normal)   Pulse 78   Temp 97.9 F (36.6 C) (Temporal)   Ht 4\' 10"  (1.473 m)   Wt 123 lb 7 oz (56 kg)   SpO2 97%   BMI 25.80 kg/m    CC: 4 wk f/u visit  Subjective:    Patient ID: Beth Bailey, female    DOB: 1946/04/26, 74 y.o.   MRN: [id number]  HPI: Beth Bailey is a 74 y.o. female presenting on 02/13/2020 for Hypertension (Here for 4 wk f/u.)   See prior note for details.   Just moved mother into nursing home. Daughter mentions concern of increasing forgetfulness. Daughter has started managing medications for patient.   HTN - Compliant with current antihypertensive regimen of losartan 25mg  daily. Does check blood pressures at home: see mychart message - largely 118-142/60-70s. No low blood pressure readings or symptoms of dizziness/syncope. Denies HA, vision changes, CP/tightness, SOB, leg swelling.   Requests handicap placard application due to disability for walking long distances. Chronic back pain limits ability to walk long distances.      Relevant past medical, surgical, family and social history reviewed and updated as indicated. Interim medical history since our last visit reviewed. Allergies and medications reviewed and updated. Outpatient Medications Prior to Visit  Medication Sig Dispense Refill  . atorvastatin (LIPITOR) 40 MG tablet Take 1 tablet (40 mg total) by mouth daily at 6 PM. 90 tablet 3  . Biotin 5000 MCG CAPS Take 5,000 mcg by mouth 2 (two) times a week. As needed    . cetirizine (ZYRTEC) 10 MG tablet Take 10 mg by mouth at bedtime.     . gabapentin (NEURONTIN) 300 MG capsule Take 1 capsule (300 mg total) by mouth 2 (two) times daily. 60 capsule 6  . HYDROcodone-acetaminophen (NORCO/VICODIN) 5-325 MG tablet TAKE 1 TABLET BY MOUTH EVERY 6 HOURS AS NEEDED FOR PAIN. (Patient taking differently: Take 1 tablet by mouth two times a  day as needed for pain) 30 tablet 0  . LOTEMAX 0.5 % ophthalmic suspension Place 1 drop into the right eye at bedtime.   0  . Multiple Vitamin (MULTIVITAMIN WITH MINERALS) TABS tablet Take 1 tablet by mouth 2 (two) times a week. As needed    . Omega-3 Fatty Acids (FISH OIL PO) Take 1 capsule by mouth daily.    Marland Kitchen omeprazole (PRILOSEC) 20 MG capsule Take 1 capsule by mouth once daily 90 capsule 3  . ondansetron (ZOFRAN ODT) 4 MG disintegrating tablet Take 1 tablet (4 mg total) by mouth every 8 (eight) hours as needed for nausea or vomiting. 12 tablet 0  . Polyethyl Glycol-Propyl Glycol 0.4-0.3 % SOLN Place 1 drop into both eyes 3 (three) times daily as needed (for dry eyes).     . sertraline (ZOLOFT) 100 MG tablet Take 1 tablet (100 mg total) by mouth daily. 90 tablet 3  . valACYclovir (VALTREX) 1000 MG tablet Take 1 tablet (1,000 mg total) by mouth at bedtime. 90 tablet 0  . vitamin B-12 (CYANOCOBALAMIN) 1000 MCG tablet Take 1,000 mcg by mouth 3 (three) times a week.     . vitamin C (ASCORBIC ACID) 500 MG tablet Take 500 mg by mouth 3 (three) times a week. As needed    . losartan (COZAAR) 25 MG tablet Take 1 tablet (25 mg total) by mouth daily. 30 tablet 6   No facility-administered medications prior  to visit.     Per HPI unless specifically indicated in ROS section below Review of Systems Objective:  BP 136/70 (BP Location: Left Arm, Patient Position: Sitting, Cuff Size: Normal)   Pulse 78   Temp 97.9 F (36.6 C) (Temporal)   Ht 4\' 10"  (1.473 m)   Wt 123 lb 7 oz (56 kg)   SpO2 97%   BMI 25.80 kg/m   Wt Readings from Last 3 Encounters:  02/13/20 123 lb 7 oz (56 kg)  01/08/20 123 lb 9 oz (56 kg)  01/04/20 123 lb (55.8 kg)      Physical Exam Vitals and nursing note reviewed.  Constitutional:      Appearance: Normal appearance. She is not ill-appearing.  Cardiovascular:     Rate and Rhythm: Normal rate and regular rhythm.     Pulses: Normal pulses.     Heart sounds: Normal heart  sounds. No murmur.  Pulmonary:     Effort: Pulmonary effort is normal. No respiratory distress.     Breath sounds: Normal breath sounds. No wheezing, rhonchi or rales.  Musculoskeletal:     Right lower leg: No edema.     Left lower leg: No edema.  Skin:    General: Skin is warm and dry.     Findings: No rash.  Neurological:     Mental Status: She is alert.  Psychiatric:        Mood and Affect: Mood normal.        Behavior: Behavior normal.       Results for orders placed or performed during the hospital encounter of 01/05/20  SARS CORONAVIRUS 2 (TAT 6-24 HRS) Nasopharyngeal Nasopharyngeal Swab   Specimen: Nasopharyngeal Swab  Result Value Ref Range   SARS Coronavirus 2 NEGATIVE NEGATIVE  Comprehensive metabolic panel  Result Value Ref Range   Sodium 139 135 - 145 mmol/L   Potassium 3.7 3.5 - 5.1 mmol/L   Chloride 101 98 - 111 mmol/L   CO2 28 22 - 32 mmol/L   Glucose, Bld 163 (H) 70 - 99 mg/dL   BUN 15 8 - 23 mg/dL   Creatinine, Ser 0.70 0.44 - 1.00 mg/dL   Calcium 9.5 8.9 - 10.3 mg/dL   Total Protein 7.2 6.5 - 8.1 g/dL   Albumin 4.4 3.5 - 5.0 g/dL   AST 25 15 - 41 U/L   ALT 18 0 - 44 U/L   Alkaline Phosphatase 56 38 - 126 U/L   Total Bilirubin 0.6 0.3 - 1.2 mg/dL   GFR calc non Af Amer >60 >60 mL/min   GFR calc Af Amer >60 >60 mL/min   Anion gap 10 5 - 15  CBC  Result Value Ref Range   WBC 11.5 (H) 4.0 - 10.5 K/uL   RBC 4.17 3.87 - 5.11 MIL/uL   Hemoglobin 13.0 12.0 - 15.0 g/dL   HCT 38.8 36.0 - 46.0 %   MCV 93.0 80.0 - 100.0 fL   MCH 31.2 26.0 - 34.0 pg   MCHC 33.5 30.0 - 36.0 g/dL   RDW 12.7 11.5 - 15.5 %   Platelets 181 150 - 400 K/uL   nRBC 0.0 0.0 - 0.2 %  Urinalysis, Complete w Microscopic  Result Value Ref Range   Color, Urine YELLOW (A) YELLOW   APPearance CLEAR (A) CLEAR   Specific Gravity, Urine 1.016 1.005 - 1.030   pH 5.0 5.0 - 8.0   Glucose, UA NEGATIVE NEGATIVE mg/dL   Hgb urine dipstick NEGATIVE NEGATIVE  Bilirubin Urine NEGATIVE NEGATIVE    Ketones, ur NEGATIVE NEGATIVE mg/dL   Protein, ur NEGATIVE NEGATIVE mg/dL   Nitrite NEGATIVE NEGATIVE   Leukocytes,Ua TRACE (A) NEGATIVE   RBC / HPF 0-5 0 - 5 RBC/hpf   WBC, UA 6-10 0 - 5 WBC/hpf   Bacteria, UA NONE SEEN NONE SEEN   Squamous Epithelial / LPF 0-5 0 - 5   Mucus PRESENT   Troponin I (High Sensitivity)  Result Value Ref Range   Troponin I (High Sensitivity) 3 <18 ng/L   Assessment & Plan:  This visit occurred during the SARS-CoV-2 public health emergency.  Safety protocols were in place, including screening questions prior to the visit, additional usage of staff PPE, and extensive cleaning of exam room while observing appropriate contact time as indicated for disinfecting solutions.   Problem List Items Addressed This Visit    Statin intolerance    Seems to be tolerating atorvastatin 40mg  daily - continue.       Osteoarthritis   HNP (herniated nucleus pulposus), lumbar    Chronic back pain limits ambulation - requests handicap placard      Familial hyperlipidemia, high LDL    Too early to check FLP. Restarted daily lipitor late 12/2019      Relevant Medications   losartan (COZAAR) 25 MG tablet   Essential hypertension - Primary    Chronic, significantly improved control. Continue current regimen. Check BMP today.       Relevant Medications   losartan (COZAAR) 25 MG tablet   Other Relevant Orders   TSH   T4, free   Basic metabolic panel   Caregiver burden    See recent mychart message. Check B12, TSH.  Husband passed away earlier this year, 55yo mother recently moved into nursing home.  Continue sertraline, may be able to titrate down in the future.        Other Visit Diagnoses    Memory difficulty       Relevant Orders   TSH   Vitamin B12       Meds ordered this encounter  Medications  . losartan (COZAAR) 25 MG tablet    Sig: Take 1 tablet (25 mg total) by mouth daily.    Dispense:  90 tablet    Refill:  3   Orders Placed This Encounter   Procedures  . TSH  . Vitamin B12  . T4, free  . Basic metabolic panel    Patient Instructions  Blood pressures are looking better! Continue losartan 25mg  daily - 3 month supply sent to pharmacy.  Labs today.  Handicap placard provided today  Return as needed or in 3 months for follow up visit.    Follow up plan: Return in about 3 months (around 05/15/2020) for follow up visit.  Ria Bush, MD

## 2020-02-13 NOTE — Patient Instructions (Addendum)
Blood pressures are looking better! Continue losartan 25mg  daily - 3 month supply sent to pharmacy.  Labs today.  Handicap placard provided today  Return as needed or in 3 months for follow up visit.

## 2020-02-13 NOTE — Assessment & Plan Note (Signed)
Too early to check FLP. Restarted daily lipitor late 12/2019

## 2020-02-13 NOTE — Assessment & Plan Note (Addendum)
See recent [employer]. Check B12, TSH.  Husband passed away earlier this year, 74yo mother recently moved into nursing home.  Continue sertraline, may be able to titrate down in the future.

## 2020-02-14 NOTE — Telephone Encounter (Signed)
Patient seen yesterday.

## 2020-02-22 DIAGNOSIS — H52202 Unspecified astigmatism, left eye: Secondary | ICD-10-CM | POA: Diagnosis not present

## 2020-02-22 DIAGNOSIS — H524 Presbyopia: Secondary | ICD-10-CM | POA: Diagnosis not present

## 2020-02-22 DIAGNOSIS — H5202 Hypermetropia, left eye: Secondary | ICD-10-CM | POA: Diagnosis not present

## 2020-03-03 ENCOUNTER — Telehealth: Payer: Self-pay

## 2020-03-03 NOTE — Telephone Encounter (Signed)
LMOM for pt to c/b to r/s DEXA. [place]

## 2020-03-13 DIAGNOSIS — Z79899 Other long term (current) drug therapy: Secondary | ICD-10-CM | POA: Diagnosis not present

## 2020-03-13 DIAGNOSIS — M5136 Other intervertebral disc degeneration, lumbar region: Secondary | ICD-10-CM | POA: Diagnosis not present

## 2020-03-13 DIAGNOSIS — M5416 Radiculopathy, lumbar region: Secondary | ICD-10-CM | POA: Diagnosis not present

## 2020-04-02 DIAGNOSIS — H2512 Age-related nuclear cataract, left eye: Secondary | ICD-10-CM | POA: Diagnosis not present

## 2020-04-02 DIAGNOSIS — T85398D Other mechanical complication of other ocular prosthetic devices, implants and grafts, subsequent encounter: Secondary | ICD-10-CM | POA: Diagnosis not present

## 2020-04-02 DIAGNOSIS — Z961 Presence of intraocular lens: Secondary | ICD-10-CM | POA: Diagnosis not present

## 2020-04-15 ENCOUNTER — Other Ambulatory Visit: Payer: PPO

## 2020-04-15 ENCOUNTER — Other Ambulatory Visit: Payer: Self-pay

## 2020-05-02 ENCOUNTER — Other Ambulatory Visit: Payer: Self-pay

## 2020-05-02 ENCOUNTER — Other Ambulatory Visit (INDEPENDENT_AMBULATORY_CARE_PROVIDER_SITE_OTHER): Payer: PPO

## 2020-05-02 DIAGNOSIS — E7849 Other hyperlipidemia: Secondary | ICD-10-CM

## 2020-05-02 DIAGNOSIS — Z789 Other specified health status: Secondary | ICD-10-CM

## 2020-05-02 LAB — HEPATIC FUNCTION PANEL
ALT: 18 U/L (ref 0–35)
AST: 24 U/L (ref 0–37)
Albumin: 4.5 g/dL (ref 3.5–5.2)
Alkaline Phosphatase: 64 U/L (ref 39–117)
Bilirubin, Direct: 0.1 mg/dL (ref 0.0–0.3)
Total Bilirubin: 0.6 mg/dL (ref 0.2–1.2)
Total Protein: 6.7 g/dL (ref 6.0–8.3)

## 2020-05-02 LAB — LIPID PANEL
Cholesterol: 254 mg/dL — ABNORMAL HIGH (ref 0–200)
HDL: 51 mg/dL (ref >39.00)
LDL Cholesterol: 171 mg/dL — ABNORMAL HIGH (ref 0–99)
NonHDL: 202.86
Total CHOL/HDL Ratio: 5
Triglycerides: 159 mg/dL — ABNORMAL HIGH (ref 0.0–149.0)
VLDL: 31.8 mg/dL (ref 0.0–40.0)

## 2020-05-02 LAB — CK: Total CK: 101 U/L (ref 7–177)

## 2020-05-11 ENCOUNTER — Other Ambulatory Visit: Payer: Self-pay | Admitting: Family Medicine

## 2020-07-21 ENCOUNTER — Other Ambulatory Visit: Payer: Self-pay | Admitting: Family Medicine

## 2020-07-21 DIAGNOSIS — Z1231 Encounter for screening mammogram for malignant neoplasm of breast: Secondary | ICD-10-CM

## 2020-07-30 ENCOUNTER — Ambulatory Visit
Admission: RE | Admit: 2020-07-30 | Discharge: 2020-07-30 | Disposition: A | Discharge status: Home / Self Care | Payer: PPO | Source: Ambulatory Visit | Attending: Family Medicine | Admitting: Family Medicine

## 2020-07-30 ENCOUNTER — Other Ambulatory Visit: Payer: Self-pay

## 2020-07-30 DIAGNOSIS — Z1231 Encounter for screening mammogram for malignant neoplasm of breast: Secondary | ICD-10-CM | POA: Diagnosis not present

## 2020-08-02 LAB — HM MAMMOGRAPHY

## 2020-08-04 ENCOUNTER — Encounter: Payer: Self-pay | Admitting: Family Medicine

## 2020-08-07 ENCOUNTER — Other Ambulatory Visit: Payer: Self-pay | Admitting: Family Medicine

## 2020-08-07 DIAGNOSIS — M19032 Primary osteoarthritis, left wrist: Secondary | ICD-10-CM | POA: Diagnosis not present

## 2020-08-07 DIAGNOSIS — M5126 Other intervertebral disc displacement, lumbar region: Secondary | ICD-10-CM | POA: Diagnosis not present

## 2020-08-07 DIAGNOSIS — M5136 Other intervertebral disc degeneration, lumbar region: Secondary | ICD-10-CM | POA: Diagnosis not present

## 2020-08-07 DIAGNOSIS — M25551 Pain in right hip: Secondary | ICD-10-CM | POA: Diagnosis not present

## 2020-08-07 DIAGNOSIS — M5416 Radiculopathy, lumbar region: Secondary | ICD-10-CM | POA: Diagnosis not present

## 2020-08-07 DIAGNOSIS — M48062 Spinal stenosis, lumbar region with neurogenic claudication: Secondary | ICD-10-CM | POA: Diagnosis not present

## 2020-08-07 DIAGNOSIS — M25532 Pain in left wrist: Secondary | ICD-10-CM | POA: Diagnosis not present

## 2020-08-07 DIAGNOSIS — M19042 Primary osteoarthritis, left hand: Secondary | ICD-10-CM | POA: Diagnosis not present

## 2020-09-25 ENCOUNTER — Other Ambulatory Visit: Payer: Self-pay | Admitting: Family Medicine

## 2020-11-27 ENCOUNTER — Telehealth: Payer: Self-pay | Admitting: Family Medicine

## 2020-11-27 NOTE — Telephone Encounter (Signed)
E-scribed refills.  Plz schedule wellness, lab and cpe visits.

## 2020-11-28 NOTE — Telephone Encounter (Signed)
Called and left a message ( no details due to [facility]) to call the office. Will attempt again. EM

## 2020-11-28 NOTE — Telephone Encounter (Signed)
Noted  

## 2020-12-03 ENCOUNTER — Telehealth: Payer: Self-pay

## 2020-12-03 NOTE — Telephone Encounter (Signed)
Received message from [facility] stating losartan 25 mg tab is on backorder.  Requesting new rx for alternative, if appropriate.

## 2020-12-04 MED ORDER — LOSARTAN POTASSIUM 50 MG PO TABS: 25.0000 mg | ORAL_TABLET | Freq: Every day | ORAL | 1 refills | Status: DC

## 2020-12-04 NOTE — Telephone Encounter (Signed)
New Rx for losartan 50mg  sent to take 1/2 tab daily.

## 2020-12-04 NOTE — Addendum Note (Signed)
Addended by: Ria Bush on: 12/04/2020 11:31 AM   Modules accepted: Orders

## 2020-12-05 NOTE — Telephone Encounter (Signed)
Patient notified by telephone and she verbalized understanding.

## 2020-12-26 ENCOUNTER — Telehealth: Payer: Self-pay

## 2020-12-26 DIAGNOSIS — Z1211 Encounter for screening for malignant neoplasm of colon: Secondary | ICD-10-CM

## 2020-12-26 NOTE — Telephone Encounter (Signed)
Spoke with pt relaying Dr. Synthia Innocent message.  Pt verbalizes understanding and will get it scheduled.

## 2020-12-26 NOTE — Telephone Encounter (Signed)
She is overdue. New referral placed back to Dr Carlean Purl.

## 2020-12-26 NOTE — Addendum Note (Signed)
Addended by: Ria Bush on: 12/26/2020 05:08 PM   Modules accepted: Orders

## 2020-12-26 NOTE — Telephone Encounter (Signed)
Patient is calling in stating she is wondering if due for a colonoscopy, and if she is does she need to have a referral placed or can she call her Gastro office to get scheduled.

## 2020-12-26 NOTE — Telephone Encounter (Signed)
Looks like pt's last colonoscopy was 11/19/2016 and she's on 5 yr schedule.  Pt was due 10/2015.

## 2021-01-05 DIAGNOSIS — M5416 Radiculopathy, lumbar region: Secondary | ICD-10-CM | POA: Diagnosis not present

## 2021-01-05 DIAGNOSIS — M48062 Spinal stenosis, lumbar region with neurogenic claudication: Secondary | ICD-10-CM | POA: Diagnosis not present

## 2021-01-05 DIAGNOSIS — M5136 Other intervertebral disc degeneration, lumbar region: Secondary | ICD-10-CM | POA: Diagnosis not present

## 2021-01-05 DIAGNOSIS — M5126 Other intervertebral disc displacement, lumbar region: Secondary | ICD-10-CM | POA: Diagnosis not present

## 2021-01-24 ENCOUNTER — Other Ambulatory Visit: Payer: Self-pay | Admitting: Family Medicine

## 2021-01-24 DIAGNOSIS — E7849 Other hyperlipidemia: Secondary | ICD-10-CM

## 2021-01-24 DIAGNOSIS — E118 Type 2 diabetes mellitus with unspecified complications: Secondary | ICD-10-CM

## 2021-01-24 DIAGNOSIS — E538 Deficiency of other specified B group vitamins: Secondary | ICD-10-CM

## 2021-01-27 ENCOUNTER — Ambulatory Visit: Payer: PPO

## 2021-01-28 ENCOUNTER — Other Ambulatory Visit: Payer: PPO

## 2021-02-02 ENCOUNTER — Encounter: Payer: PPO | Admitting: Family Medicine

## 2021-02-10 ENCOUNTER — Encounter: Payer: Self-pay | Admitting: Internal Medicine

## 2021-02-25 ENCOUNTER — Other Ambulatory Visit (INDEPENDENT_AMBULATORY_CARE_PROVIDER_SITE_OTHER): Payer: Medicare HMO

## 2021-02-25 ENCOUNTER — Other Ambulatory Visit: Payer: Self-pay

## 2021-02-25 DIAGNOSIS — E118 Type 2 diabetes mellitus with unspecified complications: Secondary | ICD-10-CM

## 2021-02-25 DIAGNOSIS — E538 Deficiency of other specified B group vitamins: Secondary | ICD-10-CM

## 2021-02-25 DIAGNOSIS — E7849 Other hyperlipidemia: Secondary | ICD-10-CM

## 2021-02-25 LAB — COMPREHENSIVE METABOLIC PANEL
ALT: 16 U/L (ref 0–35)
AST: 23 U/L (ref 0–37)
Albumin: 4.4 g/dL (ref 3.5–5.2)
Alkaline Phosphatase: 55 U/L (ref 39–117)
BUN: 11 mg/dL (ref 6–23)
CO2: 30 mEq/L (ref 19–32)
Calcium: 9.6 mg/dL (ref 8.4–10.5)
Chloride: 105 mEq/L (ref 96–112)
Creatinine, Ser: 0.69 mg/dL (ref 0.40–1.20)
GFR: 85.01 mL/min (ref >60.00)
Glucose, Bld: 109 mg/dL — ABNORMAL HIGH (ref 70–99)
Potassium: 4.8 mEq/L (ref 3.5–5.1)
Sodium: 143 mEq/L (ref 135–145)
Total Bilirubin: 0.6 mg/dL (ref 0.2–1.2)
Total Protein: 6.7 g/dL (ref 6.0–8.3)

## 2021-02-25 LAB — LIPID PANEL
Cholesterol: 162 mg/dL (ref 0–200)
HDL: 49.2 mg/dL (ref >39.00)
LDL Cholesterol: 87 mg/dL (ref 0–99)
NonHDL: 112.61
Total CHOL/HDL Ratio: 3
Triglycerides: 128 mg/dL (ref 0.0–149.0)
VLDL: 25.6 mg/dL (ref 0.0–40.0)

## 2021-02-25 LAB — HEMOGLOBIN A1C: Hgb A1c MFr Bld: 6.7 % — ABNORMAL HIGH (ref 4.6–6.5)

## 2021-02-25 LAB — VITAMIN B12: Vitamin B-12: 348 pg/mL (ref 211–911)

## 2021-03-03 ENCOUNTER — Ambulatory Visit: Payer: Medicare HMO

## 2021-03-03 ENCOUNTER — Telehealth: Payer: Self-pay

## 2021-03-03 ENCOUNTER — Other Ambulatory Visit: Payer: Self-pay

## 2021-03-03 NOTE — Progress Notes (Deleted)
Subjective:   Beth Bailey is a 75 y.o. female who presents for Medicare Annual (Subsequent) preventive examination.  Review of Systems: N/A      I connected with the patient today by telephone and verified that I am speaking with the correct person using two identifiers. Location patient: home Location nurse: work Persons participating in the telephone visit: patient, nurse.   I discussed the limitations, risks, security and privacy concerns of performing an evaluation and management service by telephone and the availability of in person appointments. I also discussed with the patient that there may be a patient responsible charge related to this service. The patient expressed understanding and verbally consented to this telephonic visit.              Objective:    There were no vitals filed for this visit. There is no height or weight on file to calculate BMI.  Advanced Directives 01/04/2020 12/08/2018 11/30/2017 05/28/2016 05/16/2015 12/07/2012  Does Patient Have a Medical Advance Directive? Yes No Yes Yes Yes Patient does not have advance directive  Type of Advance Directive [place];Living will - [place];Living will [place];Living will [place];Living will -  Does patient want to make changes to medical advance directive? - - - No - Patient declined No - Patient declined -  Copy of [place] in Chart? No - copy requested - No - copy requested No - copy requested No - copy requested -  Would patient like information on creating a medical advance directive? No - Patient declined No - Patient declined - - - -    Current Medications (verified) Outpatient Encounter Medications as of 03/03/2021  Medication Sig  . atorvastatin (LIPITOR) 40 MG tablet Take 1 tablet (40 mg total) by mouth daily at 6 PM.  . Biotin 5000 MCG CAPS Take 5,000 mcg by mouth 2 (two) times a week. As needed   . cetirizine (ZYRTEC) 10 MG tablet Take 10 mg by mouth at bedtime.   . gabapentin (NEURONTIN) 300 MG capsule Take 1 capsule (300 mg total) by mouth 2 (two) times daily.  Marland Kitchen HYDROcodone-acetaminophen (NORCO/VICODIN) 5-325 MG tablet TAKE 1 TABLET BY MOUTH EVERY 6 HOURS AS NEEDED FOR PAIN. (Patient taking differently: Take 1 tablet by mouth two times a day as needed for pain)  . losartan (COZAAR) 25 MG tablet TAKE 1 TABLET BY MOUTH EVERY DAY  . losartan (COZAAR) 50 MG tablet Take 0.5 tablets (25 mg total) by mouth daily.  Marland Kitchen LOTEMAX 0.5 % ophthalmic suspension Place 1 drop into the right eye at bedtime.   . Multiple Vitamin (MULTIVITAMIN WITH MINERALS) TABS tablet Take 1 tablet by mouth 2 (two) times a week. As needed  . Omega-3 Fatty Acids (FISH OIL PO) Take 1 capsule by mouth daily.  Marland Kitchen omeprazole (PRILOSEC) 20 MG capsule TAKE 1 CAPSULE BY MOUTH EVERY DAY  . ondansetron (ZOFRAN ODT) 4 MG disintegrating tablet Take 1 tablet (4 mg total) by mouth every 8 (eight) hours as needed for nausea or vomiting.  Vladimir Faster Glycol-Propyl Glycol 0.4-0.3 % SOLN Place 1 drop into both eyes 3 (three) times daily as needed (for dry eyes).   . sertraline (ZOLOFT) 100 MG tablet TAKE 1 TABLET BY MOUTH EVERY DAY  . valACYclovir (VALTREX) 1000 MG tablet Take 1 tablet (1,000 mg total) by mouth at bedtime.  . vitamin B-12 (CYANOCOBALAMIN) 1000 MCG tablet Take 1,000 mcg by mouth 3 (three)  times a week.   . vitamin C (ASCORBIC ACID) 500 MG tablet Take 500 mg by mouth 3 (three) times a week. As needed   No facility-administered encounter medications on file as of 03/03/2021.    Allergies (verified) Amoxicillin, Atorvastatin, Diclofenac sodium, Erythromycin, Penicillins, Pravastatin, Simvastatin, Statins, Trifluridine, Latex, and Lisinopril   History: Past Medical History:  Diagnosis Date  . Allergic rhinitis    prior on allergy shots  . Anxiety   . Arthritis   . Carotid stenosis 06/19/2016   R <40%, L 40-59%, rpt 1 yr  (06/2016)  . Colon polyps   . Community acquired pneumonia of right lower lobe of lung 01/09/2020  . GERD (gastroesophageal reflux disease)    controlled with PPI  . Herpes simplex of eye    right - blind s/p corneal transplant  . History of colon polyps   . History of hiatal hernia   . History of melanoma    on back [name])  . HLD (hyperlipidemia)    intolerant of statins  . HNP (herniated nucleus pulposus), lumbar 12/07/12   s/p surgery  . Hyperglycemia    pt is unsure about this  . Hypertension    has taken herself off her medications   . Lesion of spleen 2013   2cm, stable on recheck  . Malignant melanoma of right thigh ([place]) 03/29/2018   breslow thickness 0.58mm (Dr Veleta Miners)  . NAFLD (nonalcoholic fatty liver disease) 12/2013   by [place]  . Osteopenia 2016   dexa T -1.5 hip  . Pneumonia    Past Surgical History:  Procedure Laterality Date  . ABDOMINAL HYSTERECTOMY  1985   ovaries remain.  endometriosis, abnormal pap as well  . CARPAL TUNNEL RELEASE Bilateral   . CATARACT EXTRACTION Right 2013  . CHOLECYSTECTOMY  2007  . COLONOSCOPY  10/2010   polyp , mild divertic, int hem, rec rpt 5 yrs Carlean Purl)  . CORNEAL TRANSPLANT Right 2012  . dexa  2016   osteopenia T-1.5  . LUMBAR LAMINECTOMY/DECOMPRESSION MICRODISCECTOMY Right 12/07/2012   Procedure: LUMBAR LAMINECTOMY/DECOMPRESSION MICRODISCECTOMY 1 LEVEL;  Surgeon: Winfield Cunas, MD; Laterality: Right;  Right lumbar five-sacral one diskectomy  . NECK SURGERY  1990s   plate in neck  . TONSILLECTOMY     Family History  Problem Relation Age of Onset  . Alzheimer's disease Father   . Arthritis Other   . Diabetes Maternal Grandmother   . Dementia Maternal Grandmother   . Stroke Maternal Grandmother   . Parkinson's disease Maternal Grandfather   . Colon cancer Sister 63  . Cancer Maternal Uncle        questionable type  . Lung cancer Paternal Uncle   . Heart attack Maternal Uncle   . Kidney disease Maternal Uncle   . Liver  disease Neg Hx   . Esophageal cancer Neg Hx   . Stomach cancer Neg Hx    Social History   Socioeconomic History  . Marital status: Married    Spouse name: Not on file  . Number of children: Not on file  . Years of education: Not on file  . Highest education level: Not on file  Occupational History  . Not on file  Tobacco Use  . Smoking status: Never Smoker  . Smokeless tobacco: Never Used  Vaping Use  . Vaping Use: Never used  Substance and Sexual Activity  . Alcohol use: No  . Drug use: No  . Sexual activity: Never    Birth control/protection: Other-see  comments  Other Topics Concern  . Not on file  Social History Narrative   Widow. Husband Ed passed away 2020/01/12. Has birds and grown dogs. One daughter - neighbor   Occupation: retired, worked at [employer]   [place] on joining gym   Diet: fruits/vegetables daily, some water   1-2 caffeinated beverages daily   Social Determinants of Radio broadcast assistant Strain: Not on file  Food Insecurity: Not on file  Transportation Needs: Not on file  Physical Activity: Not on file  Stress: Not on file  Social Connections: Not on file    Tobacco Counseling Counseling given: Not Answered   Clinical Intake:                 Diabetic: Yes Nutrition Risk Assessment:  Has the patient had any N/V/D within the last 2 months?  {YES/NO:21197} Does the patient have any non-healing wounds?  {YES/NO:21197} Has the patient had any unintentional weight loss or weight gain?  {YES/NO:21197}  Diabetes:  Is the patient diabetic?  Yes  If diabetic, was a CBG obtained today?  No  telephone visit  Did the patient bring in their glucometer from home?  No  telephone visit  How often do you monitor your CBG's? ***.   Financial Strains and Diabetes Management:  Are you having any financial strains with the device, your supplies or your medication? No .  Does the patient want to be seen by Chronic Care  Management for management of their diabetes?  No  Would the patient like to be referred to a Nutritionist or for Diabetic Management?  No   Diabetic Exams:  {Diabetic Eye Exam:2101801} Diabetic Foot Exam: Overdue, Pt has been advised about the importance in completing this exam. Pt is scheduled for diabetic foot exam on 11/28/2013.          Activities of Daily Living No flowsheet data found.  Patient Care Team: Ria Bush, MD as PCP - General (Family Medicine)  Indicate any recent Medical Services you may have received from other than [facility] providers in the past year (date may be approximate).     Assessment:   This is a routine wellness examination for Meaghan.  Hearing/Vision screen No exam data present  Dietary issues and exercise activities discussed:    Goals Addressed   None    Depression Screen PHQ 2/9 Scores 12/26/2019 12/08/2018 11/30/2017 05/28/2016 12/02/2014 09/25/2013  PHQ - 2 Score 0 0 0 0 0 0  PHQ- 9 Score 2 0 0 - - -    Fall Risk Fall Risk  12/26/2019 12/08/2018 11/30/2017 05/28/2016 12/02/2014  Falls in the past year? 1 0 No No No  Comment Fell while assisting husband - - - -  Number falls in past yr: 0 - - - -  Injury with Fall? 0 - - - -    FALL RISK PREVENTION PERTAINING TO THE HOME:  Any stairs in or around the home? Yes  If so, are there any without handrails? No  Home free of loose throw rugs in walkways, pet beds, electrical cords, etc? Yes  Adequate lighting in your home to reduce risk of falls? Yes   ASSISTIVE DEVICES UTILIZED TO PREVENT FALLS:  Life alert? {YES/NO:21197} Use of a cane, walker or w/c? {YES/NO:21197} Grab bars in the bathroom? {YES/NO:21197} Shower chair or bench in shower? {YES/NO:21197} Elevated toilet seat or a handicapped toilet? {YES/NO:21197}  TIMED UP AND GO:  Was the test performed? N/A telephone visit .  Cognitive Function: MMSE - Mini Mental State Exam 12/08/2018 11/30/2017 05/28/2016  Orientation to  time 5 5 5   Orientation to Place 5 5 5   Registration 3 3 3   Attention/ Calculation 0 0 0  Recall 3 3 3   Language- name 2 objects 0 0 0  Language- repeat 1 1 1   Language- follow 3 step command 3 3 3   Language- read & follow direction 0 0 0  Write a sentence 0 0 0  Copy design 0 0 0  Total score 20 20 20   Mini Cog  Mini-Cog screen was completed. Maximum score is 22. A value of 0 denotes this part of the MMSE was not completed or the patient failed this part of the Mini-Cog screening.       Immunizations Immunization History  Administered Date(s) Administered  . PFIZER(Purple Top)SARS-COV-2 Vaccination 11/18/2019, 12/12/2019  . Pneumococcal Conjugate-13 08/27/2014  . Pneumococcal Polysaccharide-23 05/31/2016    TDAP status: Due, Education has been provided regarding the importance of this vaccine. Advised may receive this vaccine at local pharmacy or Health Dept. Aware to provide a copy of the vaccination record if obtained from local pharmacy or Health Dept. Verbalized acceptance and understanding.  Flu Vaccine status: Declined, Education has been provided regarding the importance of this vaccine but patient still declined. Advised may receive this vaccine at local pharmacy or Health Dept. Aware to provide a copy of the vaccination record if obtained from local pharmacy or Health Dept. Verbalized acceptance and understanding.  Pneumococcal vaccine status: Up to date  {Covid-19 vaccine status:2101808}  Qualifies for Shingles Vaccine? Yes   Zostavax completed No   Shingrix Completed?: No.    Education has been provided regarding the importance of this vaccine. Patient has been advised to call insurance company to determine out of pocket expense if they have not yet received this vaccine. Advised may also receive vaccine at local pharmacy or Health Dept. Verbalized acceptance and understanding.  Screening Tests Health Maintenance  Topic Date Due  . TETANUS/TDAP  Never done  .  COLONOSCOPY (Pts 45-76yrs Insurance coverage will need to be confirmed)  10/16/2015  . OPHTHALMOLOGY EXAM  11/19/2017  . COVID-19 Vaccine (3 - Pfizer risk 4-dose series) 01/09/2020  . FOOT EXAM  06/24/2020  . INFLUENZA VACCINE  05/28/2029 (Originally 05/11/2021)  . MAMMOGRAM  08/02/2021  . HEMOGLOBIN A1C  08/28/2021  . DEXA SCAN  Completed  . Hepatitis C Screening  Completed  . PNA vac Low Risk Adult  Completed  . HPV VACCINES  Aged Out    Health Maintenance  Health Maintenance Due  Topic Date Due  . TETANUS/TDAP  Never done  . COLONOSCOPY (Pts 45-3yrs Insurance coverage will need to be confirmed)  10/16/2015  . OPHTHALMOLOGY EXAM  11/19/2017  . COVID-19 Vaccine (3 - Pfizer risk 4-dose series) 01/09/2020  . FOOT EXAM  06/24/2020    {Colorectal cancer screening:2101809}  Mammogram status: Completed 08/02/2020. Repeat every year  {Bone Density status:21018021}  Lung Cancer Screening: (Low Dose CT Chest recommended if Age 8-80 years, 30 pack-year currently smoking OR have quit w/in 15years.) does not qualify.   Additional Screening:  Hepatitis C Screening: does qualify; Completed 11/28/2013  Vision Screening: Recommended annual ophthalmology exams for early detection of glaucoma and other disorders of the eye. Is the patient up to date with their annual eye exam?  {YES/NO:21197} Who is the provider or what is the name of the office in which the patient attends annual eye exams? *** If pt  is not established with a provider, would they like to be referred to a provider to establish care? No .   Dental Screening: Recommended annual dental exams for proper oral hygiene  Community Resource Referral / Chronic Care Management: CRR required this visit?  No   CCM required this visit?  No      Plan:     I have personally reviewed and noted the following in the patient's chart:   . Medical and social history . Use of alcohol, tobacco or illicit drugs  . Current medications  and supplements including opioid prescriptions.  . Functional ability and status . Nutritional status . Physical activity . Advanced directives . List of other physicians . Hospitalizations, surgeries, and ER visits in previous 12 months . Vitals . Screenings to include cognitive, depression, and falls . Referrals and appointments  In addition, I have reviewed and discussed with patient certain preventive protocols, quality metrics, and best practice recommendations. A written personalized care plan for preventive services as well as general preventive health recommendations were provided to patient.   Due to this being a telephonic visit, the after visit summary with patients personalized plan was offered to patient via office or my-chart. Patient preferred to pick up at office at next visit or via mychart.   Andrez Grime, LPN   [phone]

## 2021-03-03 NOTE — Telephone Encounter (Signed)
Called patient 3 times trying to complete AWV. Patient never answered. Left message on voicemail notifying I called and to reschedule if provider did not complete at upcoming physical.

## 2021-03-04 ENCOUNTER — Ambulatory Visit (INDEPENDENT_AMBULATORY_CARE_PROVIDER_SITE_OTHER): Payer: Medicare HMO | Admitting: Family Medicine

## 2021-03-04 ENCOUNTER — Encounter: Payer: Self-pay | Admitting: Family Medicine

## 2021-03-04 ENCOUNTER — Other Ambulatory Visit: Payer: Self-pay

## 2021-03-04 VITALS — BP 150/80 | HR 76 | Temp 97.8°F | Ht <= 58 in | Wt 122.1 lb

## 2021-03-04 DIAGNOSIS — I10 Essential (primary) hypertension: Secondary | ICD-10-CM | POA: Diagnosis not present

## 2021-03-04 DIAGNOSIS — M85859 Other specified disorders of bone density and structure, unspecified thigh: Secondary | ICD-10-CM | POA: Diagnosis not present

## 2021-03-04 DIAGNOSIS — Z85820 Personal history of malignant melanoma of skin: Secondary | ICD-10-CM

## 2021-03-04 DIAGNOSIS — Z0000 Encounter for general adult medical examination without abnormal findings: Secondary | ICD-10-CM | POA: Diagnosis not present

## 2021-03-04 DIAGNOSIS — E7849 Other hyperlipidemia: Secondary | ICD-10-CM | POA: Diagnosis not present

## 2021-03-04 DIAGNOSIS — Z20822 Contact with and (suspected) exposure to covid-19: Secondary | ICD-10-CM | POA: Insufficient documentation

## 2021-03-04 DIAGNOSIS — K219 Gastro-esophageal reflux disease without esophagitis: Secondary | ICD-10-CM | POA: Diagnosis not present

## 2021-03-04 DIAGNOSIS — I6523 Occlusion and stenosis of bilateral carotid arteries: Secondary | ICD-10-CM

## 2021-03-04 DIAGNOSIS — R911 Solitary pulmonary nodule: Secondary | ICD-10-CM

## 2021-03-04 DIAGNOSIS — E118 Type 2 diabetes mellitus with unspecified complications: Secondary | ICD-10-CM | POA: Diagnosis not present

## 2021-03-04 DIAGNOSIS — Z7189 Other specified counseling: Secondary | ICD-10-CM | POA: Diagnosis not present

## 2021-03-04 MED ORDER — ATORVASTATIN CALCIUM 40 MG PO TABS: 40.0000 mg | ORAL_TABLET | Freq: Every day | ORAL | 3 refills | Status: DC

## 2021-03-04 MED ORDER — VITAMIN D3 25 MCG (1000 UT) PO CAPS: 1.0000 | ORAL_CAPSULE | Freq: Every day | ORAL | Status: AC

## 2021-03-04 MED ORDER — SERTRALINE HCL 100 MG PO TABS: 1.0000 | ORAL_TABLET | Freq: Every day | ORAL | 3 refills | Status: DC

## 2021-03-04 MED ORDER — OMEPRAZOLE 20 MG PO CPDR: 20.0000 mg | DELAYED_RELEASE_CAPSULE | Freq: Every day | ORAL | 3 refills | Status: DC

## 2021-03-04 MED ORDER — LOSARTAN POTASSIUM 50 MG PO TABS: 50.0000 mg | ORAL_TABLET | Freq: Every day | ORAL | 3 refills | Status: DC

## 2021-03-04 NOTE — Assessment & Plan Note (Addendum)
Rpt imaging will be due next year

## 2021-03-04 NOTE — Assessment & Plan Note (Signed)

## 2021-03-04 NOTE — Assessment & Plan Note (Signed)
Advanced directives: has this set up. Daughter (Michelle Harris) is HCPOA. Asked to bring us copy. 

## 2021-03-04 NOTE — Progress Notes (Signed)
Patient ID: Beth Bailey, female    DOB: 04-17-1946, 75 y.o.   MRN: [id number]  This visit was conducted in person.  BP (!) 150/80   Pulse 76   Temp 97.8 F (36.6 C) (Temporal)   Ht 4' 9.75" (1.467 m)   Wt 122 lb 2 oz (55.4 kg)   SpO2 96%   BMI 25.75 kg/m   160/78 on retesting  CC: AMW  Subjective:   HPI: Beth Bailey is a 75 y.o. female presenting on 03/04/2021 for Medicare Wellness and Medication Refill (Requests losartan 25 mg tab due to 50 mg tab too hard to cut in half. )   Did not see health advisor this year.    Hearing Screening   125Hz  250Hz  500Hz  1000Hz  2000Hz  3000Hz  4000Hz  6000Hz  8000Hz   Right ear:   40 20 20  20     Left ear:   20 20 20  25       Visual Acuity Screening   Right eye Left eye Both eyes  Without correction:     With correction: 20/200 20/100 20/70    Screven Visit from 03/04/2021 in [facility]  PHQ-2 Total Score 2      Fall Risk  03/04/2021 12/26/2019 12/08/2018 11/30/2017 05/28/2016  Falls in the past year? - 1 0 No No  Comment - Fell while assisting husband - - -  Number falls in past yr: 1 0 - - -  Injury with Fall? 0 0 - - -  daughter has taken over finances and medication management.   Husband passed away 01-03-2020 - hospitalized with severe infection ?meningitis in setting of vascular dementia. Hospice involved. Moved mother into nursing home last year, passed away 12/16/2020 after her 98th birthday.   HTN - on losartan 25mg  daily - has trouble cutting 50mg  dose in half. Hasn't been checking BP at home.   Sees Dr Sharlet Salina for chronic back pain.Ongoing L leg pain. Latest note reviewed from 2021-01-13. She continues gabapentin 300mg  bid, norco 5/325mg  BID PRN, flexeril 5mg  PRN.   Preventative: COLONOSCOPY 10/2010 - polyp, mild divertic, int hem, rec rpt 5 yrs given fmhx Carlean Purl) - colonoscopy set up for this summer.  Last well woman with prior PCP Jan 14, 2012, s/p hysterectomy 01-14-84, ovaries  remain.Denies pelvic pain or pressure. No vaginal/vulvar skin changes. [facility]  Dexa -3/2016osteopeniaT -1.5 at hip.  Lung cancer screening - not eligible  Flu - declines  COVID vaccine Pfizer 12-17-2019, 01/14/2020, no booster  Prevnar 08/2014. Pneumovax8/2017  Pfizer covid vaccine series completed 01-14-2020  Tetanus - declines Shingrix - discussed, declines  Advanced directives: has this set up. Daughter Kearney Hard) is HCPOA.Asked to bring [place] copy. Seat belt use discussed Sunscreen use discussed. No changing moles on skin. Overdue to see derm, in h/o MM. Non smoker Alcohol - none  Dentist - overdue - has upper dentures Eye exam yearly - h/o R corneal transplant  Bowel - no constipation  Bladder - no incontinence - better with kegel exercises   Widow - husband with dementia passed away Jan 14, 2020 One daughter - neighbor Occupation: retired, worked at [employer]  Activity:no regular exercise, but walkingregularly - has membership to [employer] Diet: fruits/vegetables daily,goodwater     Relevant past medical, surgical, family and social history reviewed and updated as indicated. Interim medical history since our last visit reviewed. Allergies and medications reviewed and updated. Outpatient Medications Prior to Visit  Medication Sig Dispense Refill  .  Biotin 5000 MCG CAPS Take 5,000 mcg by mouth 2 (two) times a week. As needed    . cetirizine (ZYRTEC) 10 MG tablet Take 10 mg by mouth at bedtime.     . gabapentin (NEURONTIN) 300 MG capsule Take 1 capsule (300 mg total) by mouth 2 (two) times daily. 60 capsule 6  . HYDROcodone-acetaminophen (NORCO/VICODIN) 5-325 MG tablet TAKE 1 TABLET BY MOUTH EVERY 6 HOURS AS NEEDED FOR PAIN. 30 tablet 0  . LOTEMAX 0.5 % ophthalmic suspension Place 1 drop into the right eye at bedtime.   0  . METAMUCIL FIBER PO Take by mouth as needed.    Marland Kitchen MISC NATURAL PRODUCTS PO Take by mouth as needed. Fast Acting  Dairy Digestive supplement    . Multiple Vitamin (MULTIVITAMIN WITH MINERALS) TABS tablet Take 1 tablet by mouth 2 (two) times a week. As needed    . Omega-3 Fatty Acids (FISH OIL PO) Take 1 capsule by mouth daily.    Vladimir Faster Glycol-Propyl Glycol 0.4-0.3 % SOLN Place 1 drop into both eyes 3 (three) times daily as needed (for dry eyes).     . valACYclovir (VALTREX) 1000 MG tablet Take 1 tablet (1,000 mg total) by mouth at bedtime. 90 tablet 0  . vitamin B-12 (CYANOCOBALAMIN) 1000 MCG tablet Take 1,000 mcg by mouth 3 (three) times a week.     . vitamin C (ASCORBIC ACID) 500 MG tablet Take 500 mg by mouth 3 (three) times a week. As needed    . atorvastatin (LIPITOR) 40 MG tablet Take 1 tablet (40 mg total) by mouth daily at 6 PM. 90 tablet 3  . losartan (COZAAR) 50 MG tablet Take 0.5 tablets (25 mg total) by mouth daily. 45 tablet 1  . omeprazole (PRILOSEC) 20 MG capsule TAKE 1 CAPSULE BY MOUTH EVERY DAY 90 capsule 0  . ondansetron (ZOFRAN ODT) 4 MG disintegrating tablet Take 1 tablet (4 mg total) by mouth every 8 (eight) hours as needed for nausea or vomiting. 12 tablet 0  . sertraline (ZOLOFT) 100 MG tablet TAKE 1 TABLET BY MOUTH EVERY DAY 90 tablet 0  . losartan (COZAAR) 25 MG tablet TAKE 1 TABLET BY MOUTH EVERY DAY 90 tablet 0   No facility-administered medications prior to visit.     Per HPI unless specifically indicated in ROS section below Review of Systems  Constitutional: Negative for activity change, appetite change, chills, fatigue, fever and unexpected weight change.  HENT: Negative for hearing loss.   Eyes: Negative for visual disturbance.  Respiratory: Negative for cough, chest tightness, shortness of breath and wheezing.   Cardiovascular: Negative for chest pain, palpitations and leg swelling.  Gastrointestinal: Positive for diarrhea (food related). Negative for abdominal distention, abdominal pain, blood in stool, constipation, nausea and vomiting.  Genitourinary: Negative  for difficulty urinating and hematuria.  Musculoskeletal: Positive for myalgias (to legs). Negative for arthralgias and neck pain.  Skin: Negative for rash.  Neurological: Negative for dizziness, seizures, syncope and headaches.  Hematological: Negative for adenopathy. Does not bruise/bleed easily.  Psychiatric/Behavioral: Negative for dysphoric mood. The patient is not nervous/anxious.    Objective:  BP (!) 150/80   Pulse 76   Temp 97.8 F (36.6 C) (Temporal)   Ht 4' 9.75" (1.467 m)   Wt 122 lb 2 oz (55.4 kg)   SpO2 96%   BMI 25.75 kg/m   Wt Readings from Last 3 Encounters:  03/04/21 122 lb 2 oz (55.4 kg)  02/13/20 123 lb 7 oz (56 kg)  01/08/20 123 lb 9 oz (56 kg)      Physical Exam Vitals and nursing note reviewed.  Constitutional:      General: She is not in acute distress.    Appearance: Normal appearance. She is well-developed. She is not ill-appearing.  HENT:     Head: Normocephalic and atraumatic.     Right Ear: Hearing, tympanic membrane, ear canal and external ear normal.     Left Ear: Hearing, tympanic membrane, ear canal and external ear normal.  Eyes:     General: No scleral icterus.    Extraocular Movements: Extraocular movements intact.     Conjunctiva/sclera: Conjunctivae normal.     Pupils: Pupils are equal, round, and reactive to light.  Neck:     Thyroid: No thyroid mass or thyromegaly.     Vascular: No carotid bruit.  Cardiovascular:     Rate and Rhythm: Normal rate and regular rhythm.     Pulses: Normal pulses.          Radial pulses are 2+ on the right side and 2+ on the left side.     Heart sounds: Normal heart sounds. No murmur heard.   Pulmonary:     Effort: Pulmonary effort is normal. No respiratory distress.     Breath sounds: Normal breath sounds. No wheezing, rhonchi or rales.  Abdominal:     General: Bowel sounds are normal. There is no distension.     Palpations: Abdomen is soft. There is no mass.     Tenderness: There is no abdominal  tenderness. There is no guarding or rebound.     Hernia: No hernia is present.  Musculoskeletal:        General: Normal range of motion.     Cervical back: Normal range of motion and neck supple.     Right lower leg: No edema.     Left lower leg: No edema.  Lymphadenopathy:     Cervical: No cervical adenopathy.  Skin:    General: Skin is warm and dry.     Findings: No rash.  Neurological:     General: No focal deficit present.     Mental Status: She is alert and oriented to person, place, and time.     Comments:  CN grossly intact, station and gait intact Recall 1/3, 3/3 with cue Calculation 5/5 DLROW  Psychiatric:        Mood and Affect: Mood normal.        Behavior: Behavior normal.        Thought Content: Thought content normal.        Judgment: Judgment normal.       Results for orders placed or performed in visit on 02/25/21  Vitamin B12  Result Value Ref Range   Vitamin B-12 348 211 - 911 pg/mL  Hemoglobin A1c  Result Value Ref Range   Hgb A1c MFr Bld 6.7 (H) 4.6 - 6.5 %  Lipid panel  Result Value Ref Range   Cholesterol 162 0 - 200 mg/dL   Triglycerides 128.0 0.0 - 149.0 mg/dL   HDL 49.20 >39.00 mg/dL   VLDL 25.6 0.0 - 40.0 mg/dL   LDL Cholesterol 87 0 - 99 mg/dL   Total CHOL/HDL Ratio 3    NonHDL 112.61   Comprehensive metabolic panel  Result Value Ref Range   Sodium 143 135 - 145 mEq/L   Potassium 4.8 3.5 - 5.1 mEq/L   Chloride 105 96 - 112 mEq/L   CO2 30 19 -  32 mEq/L   Glucose, Bld 109 (H) 70 - 99 mg/dL   BUN 11 6 - 23 mg/dL   Creatinine, Ser 0.69 0.40 - 1.20 mg/dL   Total Bilirubin 0.6 0.2 - 1.2 mg/dL   Alkaline Phosphatase 55 39 - 117 U/L   AST 23 0 - 37 U/L   ALT 16 0 - 35 U/L   Total Protein 6.7 6.0 - 8.3 g/dL   Albumin 4.4 3.5 - 5.2 g/dL   GFR 85.01 >60.00 mL/min   Calcium 9.6 8.4 - 10.5 mg/dL   Depression screen [facility] 2/9 03/04/2021 12/26/2019 12/08/2018 11/30/2017 05/28/2016  Decreased Interest 2 0 0 0 0  Down, Depressed, Hopeless 0 0 0 0 0   PHQ - 2 Score 2 0 0 0 0  Altered sleeping 0 0 0 0 -  Tired, decreased energy 0 0 0 0 -  Change in appetite 2 1 0 0 -  Feeling bad or failure about yourself  0 0 0 0 -  Trouble concentrating 1 1 0 0 -  Moving slowly or fidgety/restless 0 0 0 0 -  Suicidal thoughts 0 0 0 0 -  PHQ-9 Score 5 2 0 0 -  Difficult doing work/chores - - Not difficult at all Not difficult at all -    GAD 7 : Generalized Anxiety Score 03/04/2021 12/26/2019  Nervous, Anxious, on Edge 1 2  Control/stop worrying 0 0  Worry too much - different things 1 0  Trouble relaxing 0 1  Restless 1 0  Easily annoyed or irritable 0 2  Afraid - awful might happen 0 0  Total GAD 7 Score 3 5   Assessment & Plan:  This visit occurred during the SARS-CoV-2 public health emergency.  Safety protocols were in place, including screening questions prior to the visit, additional usage of staff PPE, and extensive cleaning of exam room while observing appropriate contact time as indicated for disinfecting solutions.   Problem List Items Addressed This Visit    Controlled diabetes mellitus type 2 with complications (HCC)    Chronic, stable diet controlled.       Relevant Medications   atorvastatin (LIPITOR) 40 MG tablet   losartan (COZAAR) 50 MG tablet   Essential hypertension    Chronic, mildly elevated today. Will increase losartan to 50mg  daily and I asked her to start monitoring blood pressures at home.       Relevant Medications   atorvastatin (LIPITOR) 40 MG tablet   losartan (COZAAR) 50 MG tablet   GERD    Stable period on daily low dose omeprazole.       Relevant Medications   METAMUCIL FIBER PO   omeprazole (PRILOSEC) 20 MG capsule   Familial hyperlipidemia, high LDL    Chronic, improved on atorvastatin daily. The 10-year ASCVD risk score Mikey Bussing DC Brooke Bonito., et al., 2013) is: 46%   Values used to calculate the score:     Age: 59 years     Sex: Female     Is Non-Hispanic African American: No     Diabetic: Yes      Tobacco smoker: No     Systolic Blood Pressure: 474 mmHg     Is BP treated: Yes     HDL Cholesterol: 49.2 mg/dL     Total Cholesterol: 162 mg/dL       Relevant Medications   atorvastatin (LIPITOR) 40 MG tablet   losartan (COZAAR) 50 MG tablet   Medicare annual wellness visit, subsequent - Primary  I have personally reviewed the Medicare Annual Wellness questionnaire and have noted 1. The patient's medical and social history 2. Their use of alcohol, tobacco or illicit drugs 3. Their current medications and supplements 4. The patient's functional ability including ADL's, fall risks, home safety risks and hearing or visual impairment. Cognitive function has been assessed and addressed as indicated.  5. Diet and physical activity 6. Evidence for depression or mood disorders The patients weight, height, BMI have been recorded in the chart. I have made referrals, counseling and provided education to the patient based on review of the above and I have provided the pt with a written personalized care plan for preventive services. Provider list updated.. See scanned questionairre as needed for further documentation. Reviewed preventative protocols and updated unless pt declined.       Osteopenia    Continue calcium, vit d, will update DEXA scan.       Relevant Orders   DG Bone Density   Advanced care planning/counseling discussion    Advanced directives: has this set up. Daughter Kearney Hard) is HCPOA.Asked to bring [place] copy.      Health maintenance examination    Preventative protocols reviewed and updated unless pt declined. Discussed healthy diet and lifestyle.       Carotid stenosis    Not appreciated on exam, improved on latest [place]. Continue statin       Relevant Medications   atorvastatin (LIPITOR) 40 MG tablet   losartan (COZAAR) 50 MG tablet   History of malignant melanoma of skin    Overdue for derm f/u - referral placed to [facility].       Relevant Orders    Ambulatory referral to Dermatology   Pulmonary nodule, right    Rpt imaging will be due next year           Meds ordered this encounter  Medications  . atorvastatin (LIPITOR) 40 MG tablet    Sig: Take 1 tablet (40 mg total) by mouth daily at 6 PM.    Dispense:  90 tablet    Refill:  3  . losartan (COZAAR) 50 MG tablet    Sig: Take 1 tablet (50 mg total) by mouth daily.    Dispense:  90 tablet    Refill:  3    Note new sig  . omeprazole (PRILOSEC) 20 MG capsule    Sig: Take 1 capsule (20 mg total) by mouth daily.    Dispense:  90 capsule    Refill:  3  . sertraline (ZOLOFT) 100 MG tablet    Sig: Take 1 tablet (100 mg total) by mouth daily.    Dispense:  90 tablet    Refill:  3  . Cholecalciferol (VITAMIN D3) 25 MCG (1000 UT) CAPS    Sig: Take 1 capsule (1,000 Units total) by mouth daily.    Dispense:  30 capsule   Orders Placed This Encounter  Procedures  . DG Bone Density    Standing Status:   Future    Standing Expiration Date:   03/04/2022    Order Specific Question:   Reason for Exam (SYMPTOM  OR DIAGNOSIS REQUIRED)    Answer:   osteopenia    Order Specific Question:   Preferred imaging location?    Answer:   [facility]  . Ambulatory referral to Dermatology    Referral Priority:   Routine    Referral Type:   Consultation    Referral Reason:   Specialty Services Required  Requested Specialty:   Dermatology    Number of Visits Requested:   1    Patient instructions: Increase losartan to 50mg  daily - new instructions sent to the pharmacy.  Call to schedule bone density scan at your convenience: [place] [phone] Call to schedule dermatology follow up as you're due - referral placed to [facility]  Bring [place] a copy of your advanced directive Ensure you're taking b12 regularly as well as vitamin D Good to see you today Return as needed or in 6 months for diabetes follow up visit.   Follow up plan: Return in about 6 months  (around 09/04/2021) for follow up visit.  Ria Bush, MD

## 2021-03-04 NOTE — Assessment & Plan Note (Signed)
Chronic, stable - diet controlled.  

## 2021-03-04 NOTE — Assessment & Plan Note (Addendum)
Chronic, mildly elevated today. Will increase losartan to 50mg  daily and I asked her to start monitoring blood pressures at home.

## 2021-03-04 NOTE — Assessment & Plan Note (Signed)
Preventative protocols reviewed and updated unless pt declined. Discussed healthy diet and lifestyle.  

## 2021-03-04 NOTE — Assessment & Plan Note (Signed)
Overdue for derm f/u - referral placed to [facility].

## 2021-03-04 NOTE — Assessment & Plan Note (Signed)
Not appreciated on exam, improved on latest [place]. Continue statin

## 2021-03-04 NOTE — Assessment & Plan Note (Addendum)
Stable period on daily low dose omeprazole.

## 2021-03-04 NOTE — Assessment & Plan Note (Addendum)
Chronic, improved on atorvastatin daily. The 10-year ASCVD risk score Mikey Bussing DC Brooke Bonito., et al., 2013) is: 46%   Values used to calculate the score:     Age: 75 years     Sex: Female     Is Non-Hispanic African American: No     Diabetic: Yes     Tobacco smoker: No     Systolic Blood Pressure: 229 mmHg     Is BP treated: Yes     HDL Cholesterol: 49.2 mg/dL     Total Cholesterol: 162 mg/dL

## 2021-03-04 NOTE — Patient Instructions (Addendum)
Increase losartan to 50mg  daily - new instructions sent to the pharmacy.  Call to schedule bone density scan at your convenience: [place] [phone] Call to schedule dermatology follow up as you're due - referral placed to [facility]  Bring [place] a copy of your advanced directive Ensure you're taking b12 regularly as well as vitamin D Good to see you today Return as needed or in 6 months for diabetes follow up visit.   Health Maintenance After Age 75 After age 76, you are at a higher risk for certain long-term diseases and infections as well as injuries from falls. Falls are a major cause of broken bones and head injuries in people who are older than age 61. Getting regular preventive care can help to keep you healthy and well. Preventive care includes getting regular testing and making lifestyle changes as recommended by your health care provider. Talk with your health care provider about:  Which screenings and tests you should have. A screening is a test that checks for a disease when you have no symptoms.  A diet and exercise plan that is right for you. What should I know about screenings and tests to prevent falls? Screening and testing are the best ways to find a health problem early. Early diagnosis and treatment give you the best chance of managing medical conditions that are common after age 5. Certain conditions and lifestyle choices may make you more likely to have a fall. Your health care provider may recommend:  Regular vision checks. Poor vision and conditions such as cataracts can make you more likely to have a fall. If you wear glasses, make sure to get your prescription updated if your vision changes.  Medicine review. Work with your health care provider to regularly review all of the medicines you are taking, including over-the-counter medicines. Ask your health care provider about any side effects that may make you more likely to have a fall. Tell your  health care provider if any medicines that you take make you feel dizzy or sleepy.  Osteoporosis screening. Osteoporosis is a condition that causes the bones to get weaker. This can make the bones weak and cause them to break more easily.  Blood pressure screening. Blood pressure changes and medicines to control blood pressure can make you feel dizzy.  Strength and balance checks. Your health care provider may recommend certain tests to check your strength and balance while standing, walking, or changing positions.  Foot health exam. Foot pain and numbness, as well as not wearing proper footwear, can make you more likely to have a fall.  Depression screening. You may be more likely to have a fall if you have a fear of falling, feel emotionally low, or feel unable to do activities that you used to do.  Alcohol use screening. Using too much alcohol can affect your balance and may make you more likely to have a fall. What actions can I take to lower my risk of falls? General instructions  Talk with your health care provider about your risks for falling. Tell your health care provider if: ? You fall. Be sure to tell your health care provider about all falls, even ones that seem minor. ? You feel dizzy, sleepy, or off-balance.  Take over-the-counter and prescription medicines only as told by your health care provider. These include any supplements.  Eat a healthy diet and maintain a healthy weight. A healthy diet includes low-fat dairy products, low-fat (lean) meats, and fiber from  whole grains, beans, and lots of fruits and vegetables. Home safety  Remove any tripping hazards, such as rugs, cords, and clutter.  Install safety equipment such as grab bars in bathrooms and safety rails on stairs.  Keep rooms and walkways well-lit. Activity  Follow a regular exercise program to stay fit. This will help you maintain your balance. Ask your health care provider what types of exercise are  appropriate for you.  If you need a cane or walker, use it as recommended by your health care provider.  Wear supportive shoes that have nonskid soles.   Lifestyle  Do not drink alcohol if your health care provider tells you not to drink.  If you drink alcohol, limit how much you have: ? 0-1 drink a day for women. ? 0-2 drinks a day for men.  Be aware of how much alcohol is in your drink. In the [place] one drink equals one typical bottle of beer (12 oz), one-half glass of wine (5 oz), or one shot of hard liquor (1 oz).  Do not use any products that contain nicotine or tobacco, such as cigarettes and e-cigarettes. If you need help quitting, ask your health care provider. Summary  Having a healthy lifestyle and getting preventive care can help to protect your health and wellness after age 67.  Screening and testing are the best way to find a health problem early and help you avoid having a fall. Early diagnosis and treatment give you the best chance for managing medical conditions that are more common for people who are older than age 5.  Falls are a major cause of broken bones and head injuries in people who are older than age 45. Take precautions to prevent a fall at home.  Work with your health care provider to learn what changes you can make to improve your health and wellness and to prevent falls. This information is not intended to replace advice given to you by your health care provider. Make sure you discuss any questions you have with your health care provider. Document Revised: 01/18/2019 Document Reviewed: 08/10/2017 Elsevier Patient Education  2021 [employer].

## 2021-03-04 NOTE — Assessment & Plan Note (Signed)
Continue calcium, vit d, will update DEXA scan.

## 2021-03-14 ENCOUNTER — Other Ambulatory Visit: Payer: Self-pay | Admitting: Family Medicine

## 2021-03-16 NOTE — Telephone Encounter (Signed)
Gabapentin Last filled:  02/01/21, #60 Last OV:  03/04/21, AWV Next OV:  none

## 2021-03-19 ENCOUNTER — Telehealth: Payer: Self-pay | Admitting: Family Medicine

## 2021-03-19 NOTE — Chronic Care Management (AMB) (Signed)
  Chronic Care Management   Outreach Note  03/19/2021 Name: Lillee Mooneyhan MRN: [id number] DOB: 1945-10-14  Referred by: Ria Bush, MD Reason for referral : No chief complaint on file.   An unsuccessful telephone outreach was attempted today. The patient was referred to the pharmacist for assistance with care management and care coordination.   Follow Up Plan:   [name] Dellinger Upstream Scheduler

## 2021-03-26 ENCOUNTER — Telehealth: Payer: Self-pay | Admitting: Family Medicine

## 2021-03-26 NOTE — Chronic Care Management (AMB) (Signed)
  Chronic Care Management   Note  03/26/2021 Name: Beth Bailey MRN: [id number] DOB: 1945-12-28  [name] is a 75 y.o. year old female who is a primary care patient of Ria Bush, MD. I reached out to [place] by phone today in response to a referral sent by [name]'s PCP, Ria Bush, MD.   [name] was given information about Chronic Care Management services today including:  CCM service includes personalized support from designated clinical staff supervised by her physician, including individualized plan of care and coordination with other care providers 24/7 contact phone numbers for assistance for urgent and routine care needs. Service will only be billed when office clinical staff spend 20 minutes or more in a month to coordinate care. Only one practitioner may furnish and bill the service in a calendar month. The patient may stop CCM services at any time (effective at the end of the month) by phone call to the office staff.   Patient agreed to services and verbal consent obtained.   Follow up plan:   [name] Secretary/administrator

## 2021-04-02 ENCOUNTER — Other Ambulatory Visit: Payer: Self-pay

## 2021-04-02 ENCOUNTER — Ambulatory Visit (AMBULATORY_SURGERY_CENTER): Payer: Medicare HMO

## 2021-04-02 VITALS — Ht <= 58 in | Wt 122.0 lb

## 2021-04-02 DIAGNOSIS — Z86010 Personal history of colonic polyps: Secondary | ICD-10-CM

## 2021-04-02 NOTE — Progress Notes (Signed)

## 2021-04-10 HISTORY — PX: COLONOSCOPY: SHX174

## 2021-04-17 ENCOUNTER — Other Ambulatory Visit: Payer: Self-pay

## 2021-04-17 ENCOUNTER — Ambulatory Visit (AMBULATORY_SURGERY_CENTER): Payer: Medicare HMO | Admitting: Internal Medicine

## 2021-04-17 ENCOUNTER — Encounter: Payer: Self-pay | Admitting: Internal Medicine

## 2021-04-17 VITALS — BP 130/66 | HR 58 | Temp 98.2°F | Resp 15 | Ht <= 58 in | Wt 122.0 lb

## 2021-04-17 DIAGNOSIS — D125 Benign neoplasm of sigmoid colon: Secondary | ICD-10-CM

## 2021-04-17 DIAGNOSIS — Z86010 Personal history of colonic polyps: Secondary | ICD-10-CM | POA: Diagnosis not present

## 2021-04-17 DIAGNOSIS — D123 Benign neoplasm of transverse colon: Secondary | ICD-10-CM | POA: Diagnosis not present

## 2021-04-17 DIAGNOSIS — D122 Benign neoplasm of ascending colon: Secondary | ICD-10-CM | POA: Diagnosis not present

## 2021-04-17 MED ORDER — SODIUM CHLORIDE 0.9 % IV SOLN: 500.0000 mL | Freq: Once | INTRAVENOUS | Status: DC

## 2021-04-17 NOTE — Progress Notes (Signed)
Medical history reviewed with no changes noted. VS assessed by C.W 

## 2021-04-17 NOTE — Progress Notes (Signed)
Called to room to assist during endoscopic procedure.  Patient ID and intended procedure confirmed with present staff. Received instructions for my participation in the procedure from the performing physician.  

## 2021-04-17 NOTE — Patient Instructions (Addendum)
I found and removed 4 small polyps. I will let you know pathology results and when/if to have another routine colonoscopy by mail and/or My Chart.  You also have a condition called diverticulosis - common and not usually a problem. Please read the handout provided.  I appreciate the opportunity to care for you. Gatha Mayer, MD, [facility]  Handouts given for polyps and diverticulosis.   YOU HAD AN ENDOSCOPIC PROCEDURE TODAY AT [facility]:   Refer to the procedure report that was given to you for any specific questions about what was found during the examination.  If the procedure report does not answer your questions, please call your gastroenterologist to clarify.  If you requested that your care partner not be given the details of your procedure findings, then the procedure report has been included in a sealed envelope for you to review at your convenience later.  YOU SHOULD EXPECT: Some feelings of bloating in the abdomen. Passage of more gas than usual.  Walking can help get rid of the air that was put into your GI tract during the procedure and reduce the bloating. If you had a lower endoscopy (such as a colonoscopy or flexible sigmoidoscopy) you may notice spotting of blood in your stool or on the toilet paper. If you underwent a bowel prep for your procedure, you may not have a normal bowel movement for a few days.  Please Note:  You might notice some irritation and congestion in your nose or some drainage.  This is from the oxygen used during your procedure.  There is no need for concern and it should clear up in a day or so.  SYMPTOMS TO REPORT IMMEDIATELY:  Following lower endoscopy (colonoscopy or flexible sigmoidoscopy):  Excessive amounts of blood in the stool  Significant tenderness or worsening of abdominal pains  Swelling of the abdomen that is new, acute  Fever of 100F or higher  For urgent or emergent issues, a gastroenterologist can be reached at any hour  by calling [phone]. Do not use MyChart messaging for urgent concerns.    DIET:  We do recommend a small meal at first, but then you may proceed to your regular diet.  Drink plenty of fluids but you should avoid alcoholic beverages for 24 hours.  ACTIVITY:  You should plan to take it easy for the rest of today and you should NOT DRIVE or use heavy machinery until tomorrow (because of the sedation medicines used during the test).    FOLLOW UP: Our staff will call the number listed on your records 48-72 hours following your procedure to check on you and address any questions or concerns that you may have regarding the information given to you following your procedure. If we do not reach you, we will leave a message.  We will attempt to reach you two times.  During this call, we will ask if you have developed any symptoms of COVID 19. If you develop any symptoms (ie: fever, flu-like symptoms, shortness of breath, cough etc.) before then, please call [phone].  If you test positive for Covid 19 in the 2 weeks post procedure, please call and report this information to [place].    If any biopsies were taken you will be contacted by phone or by letter within the next 1-3 weeks.  Please call us at [phone] if you have not heard about the biopsies in 3 weeks.    SIGNATURES/CONFIDENTIALITY: You and/or your care partner have signed  paperwork which will be entered into your electronic medical record.  These signatures attest to the fact that that the information above on your After Visit Summary has been reviewed and is understood.  Full responsibility of the confidentiality of this discharge information lies with you and/or your care-partner.

## 2021-04-17 NOTE — Op Note (Signed)
[place] Patient Name: Beth Bailey Procedure Date: 04/17/2021 1:52 PM MRN: [id number] Endoscopist: Gatha Mayer , MD Age: 75 Referring MD:  Date of Birth: [birth year] Gender: Female Account #: [id number] Procedure:                Colonoscopy Indications:              Surveillance: Personal history of adenomatous                            polyps on last colonoscopy > 5 years ago, Last                            colonoscopy: January 2012 Medicines:                Propofol per Anesthesia, Monitored Anesthesia Care Procedure:                Pre-Anesthesia Assessment:                           - Prior to the procedure, a History and Physical                            was performed, and patient medications and                            allergies were reviewed. The patient's tolerance of                            previous anesthesia was also reviewed. The risks                            and benefits of the procedure and the sedation                            options and risks were discussed with the patient.                            All questions were answered, and informed consent                            was obtained. Prior Anticoagulants: The patient has                            taken no previous anticoagulant or antiplatelet                            agents. ASA Grade Assessment: II - A patient with                            mild systemic disease. After reviewing the risks                            and benefits, the patient was deemed in  satisfactory condition to undergo the procedure.                           After obtaining informed consent, the colonoscope                            was passed under direct vision. Throughout the                            procedure, the patient's blood pressure, pulse, and                            oxygen saturations were monitored continuously. The                            Olympus  PCF-H190DL [id number] [id number]) Colonoscope was                            introduced through the anus and advanced to the the                            cecum, identified by appendiceal orifice and                            ileocecal valve. The colonoscopy was performed                            without difficulty. The patient tolerated the                            procedure well. The quality of the bowel                            preparation was good. The ileocecal valve,                            appendiceal orifice, and rectum were photographed.                            The bowel preparation used was Miralax via split                            dose instruction. Scope In: 2:06:28 PM Scope Out: 2:24:28 PM Scope Withdrawal Time: 0 hours 14 minutes 36 seconds  Total Procedure Duration: 0 hours 18 minutes 0 seconds  Findings:                 The perianal and digital rectal examinations were                            normal.                           Four sessile polyps were found in the sigmoid  colon, transverse colon and ascending colon. The                            polyps were 1 to 7 mm in size. These polyps were                            removed with a cold snare. Resection and retrieval                            were complete. Verification of patient                            identification for the specimen was done. Estimated                            blood loss was minimal.                           Multiple diverticula were found in the sigmoid                            colon and descending colon.                           The exam was otherwise without abnormality on                            direct and retroflexion views. Complications:            No immediate complications. Estimated Blood Loss:     Estimated blood loss was minimal. Impression:               - Four 1 to 7 mm polyps in the sigmoid colon, in                            the  transverse colon and in the ascending colon,                            removed with a cold snare. Resected and retrieved.                           - Diverticulosis in the sigmoid colon and in the                            descending colon.                           - The examination was otherwise normal on direct                            and retroflexion views.                           - Personal history of colonic polyps. family  history of colon                           cancer (sister 39)                           two prior adenomas (max 63mm) in 2001 and a 3 mm                            polyp                           destroyed in 2006                           diminutive adenoma 2012 Recommendation:           - Patient has a contact number available for                            emergencies. The signs and symptoms of potential                            delayed complications were discussed with the                            patient. Return to normal activities tomorrow.                            Written discharge instructions were provided to the                            patient.                           - Resume previous diet.                           - Continue present medications.                           - Await pathology results.                           - No recommendation at this time regarding repeat                            colonoscopy. Gatha Mayer, MD 04/17/2021 2:36:40 PM This report has been signed electronically.

## 2021-04-17 NOTE — Progress Notes (Signed)
PT taken to PACU. Monitors in place. VSS. Report given to RN. 

## 2021-04-21 ENCOUNTER — Telehealth: Payer: Self-pay

## 2021-04-21 ENCOUNTER — Telehealth: Payer: Self-pay | Admitting: *Deleted

## 2021-04-21 NOTE — Telephone Encounter (Signed)
Pt unable to hear me on f/u call

## 2021-04-21 NOTE — Telephone Encounter (Signed)
LVM

## 2021-04-21 NOTE — Chronic Care Management (AMB) (Addendum)
Chronic Care Management Pharmacy Assistant   Name: Beth Bailey  MRN: [id number] DOB: 10-03-1946  Beth Bailey is an 75 y.o. year old female who presents for her initial CCM visit with the clinical pharmacist.  Reason for Encounter: Initial Questions   Conditions to be addressed/monitored: HTN, HLD, and DMII   Recent office visits:  03/04/21 - Dr.Guttierrez PCP [facility] - Increase Losartan to 50mg  take 1 tablet daily   Recent consult visits:  04/17/21 - Gastroenterology procedure - No medication changes 01/05/21 - [facility] - Started hydrocodone 5/325 take 1 tablet 2 times daily   Hospital visits:  None in previous 6 months  Medications: Outpatient Encounter Medications as of 04/21/2021  Medication Sig Note   atorvastatin (LIPITOR) 40 MG tablet Take 1 tablet (40 mg total) by mouth daily at 6 PM.    Biotin 5000 MCG CAPS Take 5,000 mcg by mouth 2 (two) times a week. As needed    cetirizine (ZYRTEC) 10 MG tablet Take 10 mg by mouth at bedtime.     Cholecalciferol (VITAMIN D3) 25 MCG (1000 UT) CAPS Take 1 capsule (1,000 Units total) by mouth daily.    gabapentin (NEURONTIN) 300 MG capsule TAKE 1 CAPSULE BY MOUTH TWICE A DAY    HYDROcodone-acetaminophen (NORCO/VICODIN) 5-325 MG tablet TAKE 1 TABLET BY MOUTH EVERY 6 HOURS AS NEEDED FOR PAIN.    losartan (COZAAR) 50 MG tablet Take 1 tablet (50 mg total) by mouth daily.    LOTEMAX 0.5 % ophthalmic suspension Place 1 drop into the right eye at bedtime.  03/09/2018: Per [facility], this was LF on 01/09/18 for a "75-day's supply"   METAMUCIL FIBER PO Take by mouth as needed.    MISC NATURAL PRODUCTS PO Take by mouth as needed. Fast Acting Dairy Digestive supplement    Multiple Vitamin (MULTIVITAMIN WITH MINERALS) TABS tablet Take 1 tablet by mouth 2 (two) times a week. As needed    Omega-3 Fatty Acids (FISH OIL PO) Take 1 capsule by mouth daily. (Patient not taking: Reported on 04/17/2021)    omeprazole (PRILOSEC) 20 MG  capsule Take 1 capsule (20 mg total) by mouth daily.    ondansetron (ZOFRAN-ODT) 4 MG disintegrating tablet Take 4 mg by mouth every 8 (eight) hours as needed for nausea or vomiting. (Patient not taking: Reported on 04/17/2021)    Polyethyl Glycol-Propyl Glycol 0.4-0.3 % SOLN Place 1 drop into both eyes 3 (three) times daily as needed (for dry eyes).  (Patient not taking: Reported on 04/17/2021)    sertraline (ZOLOFT) 100 MG tablet Take 1 tablet (100 mg total) by mouth daily.    valACYclovir (VALTREX) 1000 MG tablet Take 1 tablet (1,000 mg total) by mouth at bedtime.    vitamin B-12 (CYANOCOBALAMIN) 1000 MCG tablet Take 1,000 mcg by mouth 3 (three) times a week.     vitamin C (ASCORBIC ACID) 500 MG tablet Take 500 mg by mouth 3 (three) times a week. As needed (Patient not taking: Reported on 04/17/2021)    No facility-administered encounter medications on file as of 04/21/2021.    Lab Results  Component Value Date/Time   HGBA1C 6.7 (H) 02/25/2021 10:03 AM   HGBA1C 6.5 12/26/2019 01:36 PM   MICROALBUR 1.2 12/26/2019 01:36 PM   MICROALBUR 3.1 (H) 12/08/2018 11:51 AM     BP Readings from Last 3 Encounters:  04/17/21 130/66  03/04/21 (!) 150/80  02/13/20 136/70   Have you seen any other providers since your last visit with PCP?  Yes 04/17/2021 Gastroenterology procedure  Any changes in your medications or health? Yes Depression with 2 family members death in the past year   Any side effects from any medications? No  Do you have an symptoms or problems not managed by your medications? No   Any concerns about your health right now? No the patient reports she got great news following the colonoscopy and she feels very well at his time   Has your provider asked that you check blood pressure, blood sugar, or follow special diet at home? Yes The patient reports she has a blood pressure monitor and will get some readings for the upcoming appointment  Do you get any type of exercise on a regular basis?  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 No  Do you have any problems getting your medications? No  Is there anything that you would like to discuss during the appointment? No   Beth Bailey was reminded to have all medications, supplements and any blood glucose and blood pressure readings available for review with Debbora Dus, Pharm. D, at her telephone visit on 04/27/2021 at 1:00pm.    Star Rating Drugs:  Medication:  Last Fill: Day Supply Atorvastatin 40mg  03/04/21 90 Losartan 50mg  03/04/21 90    Follow-Up:  Pharmacist Review  Debbora Dus, CPP notified  [facility] Clinical Pharmacy Assistant [phone]  I have reviewed the care management and care coordination activities outlined in this encounter and I am certifying that I agree with the content of this note. No further action required.  Debbora Dus, PharmD Clinical Pharmacist [facility] at [facility] [phone]

## 2021-04-27 ENCOUNTER — Ambulatory Visit (INDEPENDENT_AMBULATORY_CARE_PROVIDER_SITE_OTHER): Payer: Medicare HMO

## 2021-04-27 ENCOUNTER — Other Ambulatory Visit: Payer: Self-pay

## 2021-04-27 DIAGNOSIS — I10 Essential (primary) hypertension: Secondary | ICD-10-CM | POA: Diagnosis not present

## 2021-04-27 DIAGNOSIS — E7849 Other hyperlipidemia: Secondary | ICD-10-CM | POA: Diagnosis not present

## 2021-04-27 NOTE — Progress Notes (Signed)
Chronic Care Management Pharmacy Note  04/27/21 Name:  Beth Bailey MRN:  [id number] DOB:  [birth year]  Summary: No medication concerns identified.  Recommendations/Changes made from today's visit: Encouraged patient to schedule annual eye exam   Plan: Follow up 6-12 months   Subjective: Beth Bailey is an 75 y.o. year old female who is a primary patient of Ria Bush, MD.  The CCM team was consulted for assistance with disease management and care coordination needs.    Engaged with patient by telephone for initial visit in response to provider referral for pharmacy case management and/or care coordination services.   Consent to Services:  The patient was given the following information about Chronic Care Management services today, agreed to services, and gave verbal consent: 1. CCM service includes personalized support from designated clinical staff supervised by the primary care provider, including individualized plan of care and coordination with other care providers 2. 24/7 contact phone numbers for assistance for urgent and routine care needs. 3. Service will only be billed when office clinical staff spend 20 minutes or more in a month to coordinate care. 4. Only one practitioner may furnish and bill the service in a calendar month. 5.The patient may stop CCM services at any time (effective at the end of the month) by phone call to the office staff. 6. The patient will be responsible for cost sharing (co-pay) of up to 20% of the service fee (after annual deductible is met). Patient agreed to services and consent obtained.  Patient Care Team: Ria Bush, MD as PCP - General (Family Medicine) Debbora Dus, [facility] as Pharmacist (Pharmacist)  Recent office visits:  03/04/21 - Dr.Guttierrez PCP - AW[facility], Increase Losartan to 60m take 1 tablet daily    Recent consult visits:  04/17/21 - Gastroenterology procedure - No medication changes [facility] Started hydrocodone 5/325 take 1 tablet 2 times daily    Hospital visits:  None in previous 6 months  Objective:  Lab Results  Component Value Date   CREATININE 0.69 02/25/2021   BUN 11 02/25/2021   GFR 85.01 02/25/2021   GFRNONAA >60 01/05/2020   GFRAA >60 01/05/2020   NA 143 02/25/2021   K 4.8 02/25/2021   CALCIUM 9.6 02/25/2021   CO2 30 02/25/2021   GLUCOSE 109 (H) 02/25/2021    Lab Results  Component Value Date/Time   HGBA1C 6.7 (H) 02/25/2021 10:03 AM   HGBA1C 6.5 12/26/2019 01:36 PM   GFR 85.01 02/25/2021 10:03 AM   GFR 79.14 02/13/2020 01:18 PM   MICROALBUR 1.2 12/26/2019 01:36 PM   MICROALBUR 3.1 (H) 12/08/2018 11:51 AM    Last diabetic Eye exam: 02018 per chart Last diabetic Foot exam: 06/2019 per PCP normal    Lab Results  Component Value Date   CHOL 162 02/25/2021   HDL 49.20 02/25/2021   LDLCALC 87 02/25/2021   LDLDIRECT 202.0 12/26/2019   TRIG 128.0 02/25/2021   CHOLHDL 3 02/25/2021    Hepatic Function Latest Ref Rng & Units 02/25/2021 05/02/2020 01/05/2020  Total Protein 6.0 - 8.3 g/dL 6.7 6.7 7.2  Albumin 3.5 - 5.2 g/dL 4.4 4.5 4.4  AST 0 - 37 U/L '23 24 25  ' ALT 0 - 35 U/L '16 18 18  ' Alk Phosphatase 39 - 117 U/L 55 64 56  Total Bilirubin 0.2 - 1.2 mg/dL 0.6 0.6 0.6  Bilirubin, Direct 0.0 - 0.3 mg/dL - 0.1 -    Lab Results  Component Value Date/Time  TSH 0.87 02/13/2020 01:18 PM   TSH 3.49 11/26/2014 10:10 AM   FREET4 1.16 02/13/2020 01:18 PM   FREET4 0.68 11/26/2014 10:10 AM    CBC Latest Ref Rng & Units 01/05/2020 01/10/2019 03/09/2018  WBC 4.0 - 10.5 K/uL 11.5(H) 7.7 10.6(H)  Hemoglobin 12.0 - 15.0 g/dL 13.0 13.9 14.2  Hematocrit 36.0 - 46.0 % 38.8 41.1 42.8  Platelets 150 - 400 K/uL 181 163.0 169    Lab Results  Component Value Date/Time   VD25OH 33.70 11/26/2014 10:10 AM    Clinical ASCVD: Yes  The 10-year ASCVD risk score Mikey Bussing DC Jr., et al., 2013) is: 37%   Values used to calculate the score:     Age: 31 years      Sex: Female     Is Non-Hispanic African American: No     Diabetic: Yes     Tobacco smoker: No     Systolic Blood Pressure: 466 mmHg     Is BP treated: Yes     HDL Cholesterol: 49.2 mg/dL     Total Cholesterol: 162 mg/dL    Depression screen [facility] 2/9 03/04/2021 12/26/2019 12/08/2018  Decreased Interest 2 0 0  Down, Depressed, Hopeless 0 0 0  PHQ - 2 Score 2 0 0  Altered sleeping 0 0 0  Tired, decreased energy 0 0 0  Change in appetite 2 1 0  Feeling bad or failure about yourself  0 0 0  Trouble concentrating 1 1 0  Moving slowly or fidgety/restless 0 0 0  Suicidal thoughts 0 0 0  PHQ-9 Score 5 2 0  Difficult doing work/chores - - Not difficult at all    Social History   Tobacco Use  Smoking Status Never  Smokeless Tobacco Never   BP Readings from Last 3 Encounters:  04/17/21 130/66  03/04/21 (!) 150/80  02/13/20 136/70   Pulse Readings from Last 3 Encounters:  04/17/21 (!) 58  03/04/21 76  02/13/20 78   Wt Readings from Last 3 Encounters:  04/17/21 122 lb (55.3 kg)  04/02/21 122 lb (55.3 kg)  03/04/21 122 lb 2 oz (55.4 kg)   BMI Readings from Last 3 Encounters:  04/17/21 25.72 kg/m  04/02/21 25.72 kg/m  03/04/21 25.75 kg/m    Assessment/Interventions: Review of patient past medical history, allergies, medications, health status, including review of consultants reports, laboratory and other test data, was performed as part of comprehensive evaluation and provision of chronic care management services.   SDOH:  (Social Determinants of Health) assessments and interventions performed: Yes SDOH Interventions    Flowsheet Row Most Recent Value  SDOH Interventions   Financial Strain Interventions Intervention Not Indicated      SDOH Screenings   Alcohol Screen: Not on file  Depression (PHQ2-9): Medium Risk   PHQ-2 Score: 5  Financial Resource Strain: Low Risk    Difficulty of Paying Living Expenses: Not very hard  Food Insecurity: Not on file  Housing: Not  on file  Physical Activity: Not on file  Social Connections: Not on file  Stress: Not on file  Tobacco Use: Low Risk    Smoking Tobacco Use: Never   Smokeless Tobacco Use: Never  Transportation Needs: Not on file    CCM Care Plan  Allergies  Allergen Reactions   Amoxicillin Other (See Comments)    Pt does not remember what the reaction was   Atorvastatin Other (See Comments)    myalgia   Diclofenac Sodium Swelling   Erythromycin Swelling  Penicillins Hives    Has patient had a PCN reaction causing immediate rash, facial/tongue/throat swelling, SOB or lightheadedness with hypotension: Yes Has patient had a PCN reaction causing severe rash involving mucus membranes or skin necrosis: No Has patient had a PCN reaction that required hospitalization No Has patient had a PCN reaction occurring within the last 10 years: No If all of the above answers are "NO", then may proceed with Cephalosporin use. Other reaction(s): Unknown, Unknown Unknown, rash possibly    Pravastatin Other (See Comments)    myalgia   Simvastatin Other (See Comments)    muscle pain   Statins Other (See Comments)    Myalgias to simvastatin, pravastatin, lovastatin, atorvastatin   Trifluridine Swelling   Latex Rash   Lisinopril Rash and Other (See Comments)    Hyperkalemia and achy bones also    Medications Reviewed Today     Reviewed by Debbora Dus, [facility] (Pharmacist) on 04/27/21 at [place] List Status: <None>   Medication Order Taking? Sig Documenting Provider Last Dose Status Informant  atorvastatin (LIPITOR) 40 MG tablet [id number] Yes Take 1 tablet (40 mg total) by mouth daily at 6 PM. Ria Bush, MD Taking Active   Biotin 5000 MCG CAPS [id number] Yes Take 5,000 mcg by mouth 2 (two) times a week. As needed [provider] Taking Active Self           Med Note Lequita Asal, [facility]   Wed Oct 29, 2015 11:16 AM)    cetirizine (ZYRTEC) 10 MG tablet [id number] Yes Take 10 mg by mouth at  bedtime.  [provider] Taking Active Self  Cholecalciferol (VITAMIN D3) 25 MCG (1000 UT) CAPS [id number] Yes Take 1 capsule (1,000 Units total) by mouth daily. Ria Bush, MD Taking Active   gabapentin (NEURONTIN) 300 MG capsule [id number] Yes TAKE 1 CAPSULE BY MOUTH TWICE A Velora Heckler, MD Taking Active   HYDROcodone-acetaminophen (NORCO/VICODIN) 5-325 MG tablet [id number] Yes TAKE 1 TABLET BY MOUTH EVERY 6 HOURS AS NEEDED FOR PAIN. Ria Bush, MD Taking Active Self  losartan (COZAAR) 50 MG tablet [id number] Yes Take 1 tablet (50 mg total) by mouth daily. Ria Bush, MD Taking Active   loteprednol (LOTEMAX) 0.5 % ophthalmic suspension [id number] Yes 1 drop. At bedtime [provider] Taking Active Self  METAMUCIL FIBER PO [id number] Yes Take by mouth as needed. [provider] Taking Active Self  MISC NATURAL PRODUCTS PO [id number] Yes Take by mouth as needed. Fast Acting Dairy Digestive supplement [provider] Taking Active Self  Omega-3 Fatty Acids (FISH OIL PO) [id number] Yes Take 1 capsule by mouth daily. OmegaXL - 2 capsule daily [provider] Taking Active   omeprazole (PRILOSEC) 20 MG capsule [id number] Yes Take 1 capsule (20 mg total) by mouth daily. Ria Bush, MD Taking Active   ondansetron (ZOFRAN-ODT) 4 MG disintegrating tablet [id number] No Take 4 mg by mouth every 8 (eight) hours as needed for nausea or vomiting.  Patient not taking: No sig reported   [provider] Not Taking Consider Medication Status and Discontinue   Polyethyl Glycol-Propyl Glycol 0.4-0.3 % SOLN [id number] Yes Place 1 drop into both eyes 3 (three) times daily as needed (for dry eyes). [provider] Taking Active   sertraline (ZOLOFT) 100 MG tablet [id number] Yes Take 1 tablet (100 mg total) by mouth daily. Ria Bush, MD Taking Active   valACYclovir (VALTREX) 1000 MG tablet [id number] Yes Take 1 tablet (1,000 mg  total) by mouth at bedtime.  Ria Bush, MD Taking Active   vitamin B-12 (CYANOCOBALAMIN) 1000 MCG tablet [id number] Yes Take 1,000 mcg by mouth 3 (three) times a week.  [provider] Taking Active Self           Med Note [facility] Oct 29, 2015 11:16 AM)              Patient Active Problem List   Diagnosis Date Noted   Statin intolerance 01/12/2020   Pulmonary nodule, right 04/13/2019   History of malignant melanoma of skin 03/29/2018   Generalized abdominal pain 03/14/2018   Carotid stenosis 06/19/2016   Health maintenance examination 05/31/2016   Advanced care planning/counseling discussion 12/02/2014   Lesion of spleen    Caregiver burden 12/18/2013   NAFLD (nonalcoholic fatty liver disease) 09/30/2013   Medicare annual wellness visit, subsequent 09/25/2013   Osteopenia    IBS (irritable bowel syndrome) 08/29/2013   Internal hemorrhoids with Grade 2 prolapse, itching, bleeding and soiling 08/29/2013   Familial hyperlipidemia, high LDL    History of colon polyps    Herpes simplex of eye    HNP (herniated nucleus pulposus), lumbar 12/07/2012   Controlled diabetes mellitus type 2 with complications ([place]) 16/04/3709   Essential hypertension 02/23/2007   GERD 02/23/2007   Osteoarthritis 02/23/2007    Immunization History  Administered Date(s) Administered   PFIZER(Purple Top)SARS-COV-2 Vaccination 11/18/2019, 12/12/2019   Pneumococcal Conjugate-13 08/27/2014   Pneumococcal Polysaccharide-23 05/31/2016   Zoster Recombinat (Shingrix) 03/23/2021    Conditions to be addressed/monitored:  Hypertension, Hyperlipidemia, Diabetes, GERD, and Depression  Care Plan : [place]  Updates made by Debbora Dus, [facility] since 05/10/2021 12:00 AM     Problem: Disease Management   Priority: High  Onset Date: 04/27/2021  Note:   Current Barriers:  None identified  Pharmacist Clinical Goal(s):  Patient will contact provider office for  questions/concerns as evidenced notation of same in electronic health record through collaboration with PharmD and provider.   Interventions: 1:1 collaboration with Ria Bush, MD regarding development and update of comprehensive plan of care as evidenced by provider attestation and co-signature Inter-disciplinary care team collaboration (see longitudinal plan of care) Comprehensive medication review performed; medication list updated in electronic medical record  Hypertension (BP goal <140/90) -Controlled- clinic readings better since losartan increased -Current treatment: Losartan 50 mg - 1 tablet daily (increased May 2022) -Medications previously tried: none reported  -Current home readings: niece checked this week - 112/61 -Current exercise habits: she tries to walk around yard, to [employer], gardening  -Denies hypotensive/hypertensive symptoms -Denies any falls recently. Reports in the past she fell often. She was not eating well, and taking her medicines as she should and felt dizzy. -Educated on BP goals and benefits of medications for prevention of heart attack, stroke and kidney damage; -Counseled to monitor BP at home once monthly or with symptoms, document, and provide log at future appointments -Recommended to continue current medication  Hyperlipidemia: (LDL goal < 100) -Controlled - LDL 87 -Current treatment: Atorvastatin 40 mg - 1 tablet daily (evening) Omega XL - takes 2 capsule once daily  -Medications previously tried: none  -Educated on Cholesterol goals;  -Recommended to continue current medication  Depression/Anxiety (Goal: Improve mood) -Controlled - per patient, this works well -Current treatment: Sertraline 100 mg - 1 tablet daily (morning) -Medications previously tried/failed: none -PHQ9: 5 -She states she has a lot of good friends that help. Enjoys gardening. -Educated on Benefits of medication for  symptom control -Recommended to continue  current medication  GERD (Goal: Control symptoms) -Controlled - per patient report -Current treatment  Omeprazole 20 mg - 1 capsule daily  -Medications previously tried: none reported  -Recommended to continue current medication  Osteoarthritis (Goal: Improve pain) -Controlled - per patient report -Followed by orthopedics, [facility] (last visit 01/05/21) -Current treatment  Hydrocodone-acetaminophen 5-325 mg - 1 tablet every 6 hours PRN Gabapentin 300 mg - 1 capsule twice daily (BF and bedtime) -Non-pharm: Wears back brace, uses heating pads -Medications previously tried: none reported, avoids NSAIDs -Most days takes 1 Norco in the morning, may take Tylenol PRN later in the day -Recommended to continue current medication  OTC/Supplements: Fast Acting Diary Digestive supplement - PRN Vitamin B12 1000 mcg - three days per week Vitamin D3 1000 IU - 1 every day  Biotin 5000 mcg - 1 twice weekly Zyrtec 10 mg - 1 every other night Metamucil -  1 every day   Patient Goals/Self-Care Activities Patient will:  - focus on medication adherence by using pill box   Follow Up Plan: Telephone follow up appointment with care management team member scheduled for:  6-12 months      Medication Assistance: None required.  Patient affirms current coverage meets needs.  Compliance/Adherence/Medication fill history: Care Gaps: Diabetic Eye Exam - she thinks it was done this year  Diabetic Foot Exam Covid-19 vaccine 3-dose series  TDAP  Star-Rating Drugs: Medication:                Last Fill:         Day Supply Atorvastatin 32m       03/04/21            90 Losartan 540m           03/04/21            90  Patient's preferred pharmacy is:  [id number]: [phone]: [phone] pill box? No, plastic basket for morning (pillbox for AM) and night Pt endorses 95% compliance, grandson  next door checks on her and reminds her to take her bedtime meds  We discussed: Benefits of medication synchronization, packaging and delivery as well as enhanced pharmacist oversight with [employer]. Patient decided to: Continue current medication management strategy; She will think about delivery/coordination.  Care Plan and Follow Up Patient Decision:  Patient agrees to Care Plan and Follow-up.  MicDebbora DusharmD Clinical Pharmacist [phone]

## 2021-05-04 ENCOUNTER — Encounter: Payer: Self-pay | Admitting: Internal Medicine

## 2021-05-08 ENCOUNTER — Telehealth: Payer: Self-pay

## 2021-05-08 NOTE — Telephone Encounter (Signed)
Pt called asking for an appt today with Dr Danise Mina for possible infection. I gave her info of UC in [place]. She may go to [facility].

## 2021-05-10 NOTE — Patient Instructions (Signed)
Dear Beth Bailey,  It was a pleasure meeting you during our initial appointment on April 27, 2021. Below is a summary of the goals we discussed and components of chronic care management. Please contact me anytime with questions or concerns.   Visit Information   Patient Care Plan: CCM Pharmacy Care Plan     Problem Identified: Disease Management   Priority: High  Onset Date: 04/27/2021  Note:   Current Barriers:  None identified  Pharmacist Clinical Goal(s):  Patient will contact provider office for questions/concerns as evidenced notation of same in electronic health record through collaboration with PharmD and provider.   Interventions: 1:1 collaboration with Ria Bush, MD regarding development and update of comprehensive plan of care as evidenced by provider attestation and co-signature Inter-disciplinary care team collaboration (see longitudinal plan of care) Comprehensive medication review performed; medication list updated in electronic medical record  Hypertension (BP goal <140/90) -Controlled- clinic readings better since losartan increased -Current treatment: Losartan 50 mg - 1 tablet daily (increased May 2022) -Medications previously tried: none reported  -Current home readings: niece checked this week - 112/61 -Current exercise habits: she tries to walk around yard, to [employer], gardening  -Denies hypotensive/hypertensive symptoms -Denies any falls recently. Reports in the past she fell often. She was not eating well, and taking her medicines as she should and felt dizzy. -Educated on BP goals and benefits of medications for prevention of heart attack, stroke and kidney damage; -Counseled to monitor BP at home once monthly or with symptoms, document, and provide log at future appointments -Recommended to continue current medication  Hyperlipidemia: (LDL goal < 100) -Controlled - LDL 87 -Current treatment: Atorvastatin 40 mg - 1 tablet daily  (evening) Omega XL - takes 2 capsule once daily  -Medications previously tried: none  -Educated on Cholesterol goals;  -Recommended to continue current medication  Depression/Anxiety (Goal: Improve mood) -Controlled - per patient, this works well -Current treatment: Sertraline 100 mg - 1 tablet daily (morning) -Medications previously tried/failed: none -PHQ9: 5 -She states she has a lot of good friends that help. Enjoys gardening. -Educated on Benefits of medication for symptom control -Recommended to continue current medication  GERD (Goal: Control symptoms) -Controlled - per patient report -Current treatment  Omeprazole 20 mg - 1 capsule daily  -Medications previously tried: none reported  -Recommended to continue current medication  Osteoarthritis (Goal: Improve pain) -Controlled - per patient report -Followed by orthopedics, [facility] (last visit 01/05/21) -Current treatment  Hydrocodone-acetaminophen 5-325 mg - 1 tablet every 6 hours PRN Gabapentin 300 mg - 1 capsule twice daily (BF and bedtime) -Non-pharm: Wears back brace, uses heating pads -Medications previously tried: none reported, avoids NSAIDs -Most days takes 1 Norco in the morning, may take Tylenol PRN later in the day -Recommended to continue current medication  OTC/Supplements: Fast Acting Diary Digestive supplement - PRN Vitamin B12 1000 mcg - three days per week Vitamin D3 1000 IU - 1 every day  Biotin 5000 mcg - 1 twice weekly Zyrtec 10 mg - 1 every other night Metamucil -  1 every day   Patient Goals/Self-Care Activities Patient will:  - focus on medication adherence by using pill box   Follow Up Plan: Telephone follow up appointment with care management team member scheduled for:  6-12 months     Ms. Adolphe was given information about Chronic Care Management services today including:  CCM service includes personalized support from designated clinical staff supervised by her physician,  including individualized plan  of care and coordination with other care providers 24/7 contact phone numbers for assistance for urgent and routine care needs. Standard insurance, coinsurance, copays and deductibles apply for chronic care management only during months in which we provide at least 20 minutes of these services. Most insurances cover these services at 100%, however patients may be responsible for any copay, coinsurance and/or deductible if applicable. This service may help you avoid the need for more expensive face-to-face services. Only one practitioner may furnish and bill the service in a calendar month. The patient may stop CCM services at any time (effective at the end of the month) by phone call to the office staff.  Patient agreed to services and verbal consent obtained.   Patient verbalizes understanding of instructions provided today and agrees to view in [place].   Debbora Dus, PharmD Clinical Pharmacist [facility] at [facility] [phone]

## 2021-05-11 ENCOUNTER — Encounter: Payer: Self-pay | Admitting: Family Medicine

## 2021-05-11 ENCOUNTER — Other Ambulatory Visit: Payer: Self-pay

## 2021-05-11 ENCOUNTER — Ambulatory Visit (INDEPENDENT_AMBULATORY_CARE_PROVIDER_SITE_OTHER): Payer: Medicare HMO | Admitting: Family Medicine

## 2021-05-11 VITALS — BP 140/90 | HR 76 | Temp 97.9°F | Ht <= 58 in | Wt 124.5 lb

## 2021-05-11 DIAGNOSIS — R109 Unspecified abdominal pain: Secondary | ICD-10-CM | POA: Diagnosis not present

## 2021-05-11 DIAGNOSIS — R10A2 Flank pain, left side: Secondary | ICD-10-CM

## 2021-05-11 DIAGNOSIS — R350 Frequency of micturition: Secondary | ICD-10-CM | POA: Diagnosis not present

## 2021-05-11 DIAGNOSIS — K5792 Diverticulitis of intestine, part unspecified, without perforation or abscess without bleeding: Secondary | ICD-10-CM

## 2021-05-11 DIAGNOSIS — R1032 Left lower quadrant pain: Secondary | ICD-10-CM | POA: Diagnosis not present

## 2021-05-11 LAB — URINALYSIS, ROUTINE W REFLEX MICROSCOPIC
Bilirubin Urine: NEGATIVE
Hgb urine dipstick: NEGATIVE
Ketones, ur: NEGATIVE
Leukocytes,Ua: NEGATIVE
Nitrite: NEGATIVE
RBC / HPF: NONE SEEN (ref 0–?)
Specific Gravity, Urine: 1.015 (ref 1.000–1.030)
Total Protein, Urine: NEGATIVE
Urine Glucose: NEGATIVE
Urobilinogen, UA: 0.2 (ref 0.0–1.0)
pH: 6 (ref 5.0–8.0)

## 2021-05-11 LAB — BASIC METABOLIC PANEL
BUN: 12 mg/dL (ref 6–23)
CO2: 30 mEq/L (ref 19–32)
Calcium: 9.7 mg/dL (ref 8.4–10.5)
Chloride: 104 mEq/L (ref 96–112)
Creatinine, Ser: 0.74 mg/dL (ref 0.40–1.20)
GFR: 79.14 mL/min (ref >60.00)
Glucose, Bld: 115 mg/dL — ABNORMAL HIGH (ref 70–99)
Potassium: 4.7 mEq/L (ref 3.5–5.1)
Sodium: 141 mEq/L (ref 135–145)

## 2021-05-11 LAB — CBC WITH DIFFERENTIAL/PLATELET
Basophils Absolute: 0.1 10*3/uL (ref 0.0–0.1)
Basophils Relative: 1.2 % (ref 0.0–3.0)
Eosinophils Absolute: 0.1 10*3/uL (ref 0.0–0.7)
Eosinophils Relative: 2.5 % (ref 0.0–5.0)
HCT: 39 % (ref 36.0–46.0)
Hemoglobin: 13 g/dL (ref 12.0–15.0)
Lymphocytes Relative: 32.5 % (ref 12.0–46.0)
Lymphs Abs: 1.8 10*3/uL (ref 0.7–4.0)
MCHC: 33.4 g/dL (ref 30.0–36.0)
MCV: 91.4 fl (ref 78.0–100.0)
Monocytes Absolute: 0.4 10*3/uL (ref 0.1–1.0)
Monocytes Relative: 6.5 % (ref 3.0–12.0)
Neutro Abs: 3.1 10*3/uL (ref 1.4–7.7)
Neutrophils Relative %: 57.3 % (ref 43.0–77.0)
Platelets: 165 10*3/uL (ref 150.0–400.0)
RBC: 4.27 Mil/uL (ref 3.87–5.11)
RDW: 14.5 % (ref 11.5–15.5)
WBC: 5.4 10*3/uL (ref 4.0–10.5)

## 2021-05-11 LAB — HEPATIC FUNCTION PANEL
ALT: 22 U/L (ref 0–35)
AST: 28 U/L (ref 0–37)
Albumin: 4.5 g/dL (ref 3.5–5.2)
Alkaline Phosphatase: 57 U/L (ref 39–117)
Bilirubin, Direct: 0.1 mg/dL (ref 0.0–0.3)
Total Bilirubin: 0.5 mg/dL (ref 0.2–1.2)
Total Protein: 7.1 g/dL (ref 6.0–8.3)

## 2021-05-11 MED ORDER — CIPROFLOXACIN HCL 750 MG PO TABS: 750.0000 mg | ORAL_TABLET | Freq: Two times a day (BID) | ORAL | 0 refills | Status: DC

## 2021-05-11 MED ORDER — METRONIDAZOLE 500 MG PO TABS: 500.0000 mg | ORAL_TABLET | Freq: Three times a day (TID) | ORAL | 0 refills | Status: DC

## 2021-05-11 NOTE — Progress Notes (Signed)
Beth Waheed T. Ngoc Detjen, MD, [place] at [facility] [place], [address]  Phone: [phone]  FAX: [place] - 75 y.o. female  MRN [id number]  Date of Birth: 30-Dec-1945  Date: 05/11/2021  PCP: Ria Bush, MD  Referral: Ria Bush, MD  Chief Complaint  Patient presents with   Abdominal Pain   Diarrhea    This visit occurred during the SARS-CoV-2 public health emergency.  Safety protocols were in place, including screening questions prior to the visit, additional usage of staff PPE, and extensive cleaning of exam room while observing appropriate contact time as indicated for disinfecting solutions.   Subjective:   Beth Bailey is a 75 y.o. very pleasant female patient with Body mass index is 26.25 kg/m. who presents with the following:  Patient presents with some abdominal pain and diarrhea, but on further questioning she has had diarrhea off and on for many years to decades.  Right now she is having lower abdominal pain in the left lower quadrant and hypogastric region, and this is new.  She has more pain when she is standing.  She does not think that she feels all that much worse today.  She is still urinating and having bowel movements regularly.  She is able to eat and drink okay  No blood or mucus in the stool'   Review of Systems is noted in the HPI, as appropriate  Objective:   BP 140/90   Pulse 76   Temp 97.9 F (36.6 C) (Temporal)   Ht 4' 9.75" (1.467 m)   Wt 124 lb 8 oz (56.5 kg)   SpO2 97%   BMI 26.25 kg/m   GEN: No acute distress; alert,appropriate. PULM: Breathing comfortably in no respiratory distress PSYCH: Normally interactive.  CV: RRR, no m/g/r  ABD: S, she does have some tenderness in the left lower quadrant as well as in the hypogastric region, ND, + BS, No rebound, No HSM  No CVA tenderness  Laboratory and Imaging Data: Results for  orders placed or performed in visit on 05/11/21  Urinalysis, Routine w reflex microscopic  Result Value Ref Range   Color, Urine YELLOW Yellow;Lt. Yellow;Straw;Dark Yellow;Amber;Green;Red;Brown   APPearance CLEAR Clear;Turbid;Slightly Cloudy;Cloudy   Specific Gravity, Urine 1.015 1.000 - 1.030   pH 6.0 5.0 - 8.0   Total Protein, Urine NEGATIVE Negative   Urine Glucose NEGATIVE Negative   Ketones, ur NEGATIVE Negative   Bilirubin Urine NEGATIVE Negative   Hgb urine dipstick NEGATIVE Negative   Urobilinogen, UA 0.2 0.0 - 1.0   Leukocytes,Ua NEGATIVE Negative   Nitrite NEGATIVE Negative   WBC, UA 0-2/hpf 0-2/hpf   RBC / HPF none seen 0-2/hpf   Squamous Epithelial / LPF Rare(0-4/hpf) Rare(0-4/hpf)  CBC with Differential/Platelet  Result Value Ref Range   WBC 5.4 4.0 - 10.5 K/uL   RBC 4.27 3.87 - 5.11 Mil/uL   Hemoglobin 13.0 12.0 - 15.0 g/dL   HCT 39.0 36.0 - 46.0 %   MCV 91.4 78.0 - 100.0 fl   MCHC 33.4 30.0 - 36.0 g/dL   RDW 14.5 11.5 - 15.5 %   Platelets 165.0 150.0 - 400.0 K/uL   Neutrophils Relative % 57.3 43.0 - 77.0 %   Lymphocytes Relative 32.5 12.0 - 46.0 %   Monocytes Relative 6.5 3.0 - 12.0 %   Eosinophils Relative 2.5 0.0 - 5.0 %   Basophils Relative 1.2 0.0 - 3.0 %   Neutro Abs  3.1 1.4 - 7.7 K/uL   Lymphs Abs 1.8 0.7 - 4.0 K/uL   Monocytes Absolute 0.4 0.1 - 1.0 K/uL   Eosinophils Absolute 0.1 0.0 - 0.7 K/uL   Basophils Absolute 0.1 0.0 - 0.1 K/uL  Hepatic function panel  Result Value Ref Range   Total Bilirubin 0.5 0.2 - 1.2 mg/dL   Bilirubin, Direct 0.1 0.0 - 0.3 mg/dL   Alkaline Phosphatase 57 39 - 117 U/L   AST 28 0 - 37 U/L   ALT 22 0 - 35 U/L   Total Protein 7.1 6.0 - 8.3 g/dL   Albumin 4.5 3.5 - 5.2 g/dL  Basic metabolic panel  Result Value Ref Range   Sodium 141 135 - 145 mEq/L   Potassium 4.7 3.5 - 5.1 mEq/L   Chloride 104 96 - 112 mEq/L   CO2 30 19 - 32 mEq/L   Glucose, Bld 115 (H) 70 - 99 mg/dL   BUN 12 6 - 23 mg/dL   Creatinine, Ser 0.74  0.40 - 1.20 mg/dL   GFR 79.14 >60.00 mL/min   Calcium 9.7 8.4 - 10.5 mg/dL     Assessment and Plan:     ICD-10-CM   1. Acute diverticulitis  K57.92 CBC with Differential/Platelet    Hepatic function panel    Basic metabolic panel    2. LLQ pain  R10.32 CBC with Differential/Platelet    Hepatic function panel    Basic metabolic panel    3. Left flank pain  R10.9 Urinalysis, Routine w reflex microscopic    Urine Culture    CBC with Differential/Platelet    Hepatic function panel    Basic metabolic panel    4. Urinary frequency  R35.0 Urinalysis, Routine w reflex microscopic    Urine Culture     Exam and history would be consistent with diverticulitis.  I am going to treat as such.  I will check a urine culture as well.  Required some redirection to get ultimate history.  She does not look toxic.  That she continues to do poorly with Alvan Dame a CT of abdomen pelvis with contrast would be an appropriate next diagnostic step  Meds ordered this encounter  Medications   metroNIDAZOLE (FLAGYL) 500 MG tablet    Sig: Take 1 tablet (500 mg total) by mouth 3 (three) times daily.    Dispense:  30 tablet    Refill:  0   ciprofloxacin (CIPRO) 750 MG tablet    Sig: Take 1 tablet (750 mg total) by mouth 2 (two) times daily.    Dispense:  20 tablet    Refill:  0   Medications Discontinued During This Encounter  Medication Reason   ondansetron (ZOFRAN-ODT) 4 MG disintegrating tablet No longer needed (for PRN medications)   Orders Placed This Encounter  Procedures   Urine Culture   Urinalysis, Routine w reflex microscopic   CBC with Differential/Platelet   Hepatic function panel   Basic metabolic panel    Follow-up: No follow-ups on file.  Signed,  Maud Deed. Junie Avilla, MD   Outpatient Encounter Medications as of 05/11/2021  Medication Sig   atorvastatin (LIPITOR) 40 MG tablet Take 1 tablet (40 mg total) by mouth daily at 6 PM.   Biotin 5000 MCG CAPS Take 5,000 mcg by mouth 2  (two) times a week. As needed   cetirizine (ZYRTEC) 10 MG tablet Take 10 mg by mouth at bedtime.    Cholecalciferol (VITAMIN D3) 25 MCG (1000 UT) CAPS Take 1 capsule (  1,000 Units total) by mouth daily.   ciprofloxacin (CIPRO) 750 MG tablet Take 1 tablet (750 mg total) by mouth 2 (two) times daily.   gabapentin (NEURONTIN) 300 MG capsule TAKE 1 CAPSULE BY MOUTH TWICE A DAY   HYDROcodone-acetaminophen (NORCO/VICODIN) 5-325 MG tablet TAKE 1 TABLET BY MOUTH EVERY 6 HOURS AS NEEDED FOR PAIN.   losartan (COZAAR) 50 MG tablet Take 1 tablet (50 mg total) by mouth daily.   loteprednol (LOTEMAX) 0.5 % ophthalmic suspension 1 drop. At bedtime   METAMUCIL FIBER PO Take by mouth as needed.   metroNIDAZOLE (FLAGYL) 500 MG tablet Take 1 tablet (500 mg total) by mouth 3 (three) times daily.   MISC NATURAL PRODUCTS PO Take by mouth as needed. Fast Acting Dairy Digestive supplement   Omega-3 Fatty Acids (FISH OIL PO) Take 1 capsule by mouth daily. OmegaXL - 2 capsule daily   omeprazole (PRILOSEC) 20 MG capsule Take 1 capsule (20 mg total) by mouth daily.   Polyethyl Glycol-Propyl Glycol 0.4-0.3 % SOLN Place 1 drop into both eyes 3 (three) times daily as needed (for dry eyes).   sertraline (ZOLOFT) 100 MG tablet Take 1 tablet (100 mg total) by mouth daily.   valACYclovir (VALTREX) 1000 MG tablet Take 1 tablet (1,000 mg total) by mouth at bedtime.   vitamin B-12 (CYANOCOBALAMIN) 1000 MCG tablet Take 1,000 mcg by mouth 3 (three) times a week.    [DISCONTINUED] ondansetron (ZOFRAN-ODT) 4 MG disintegrating tablet Take 4 mg by mouth every 8 (eight) hours as needed for nausea or vomiting. (Patient not taking: No sig reported)   No facility-administered encounter medications on file as of 05/11/2021.

## 2021-05-12 LAB — URINE CULTURE
MICRO NUMBER:: 12185887
Result:: NO GROWTH
SPECIMEN QUALITY:: ADEQUATE

## 2021-06-16 ENCOUNTER — Other Ambulatory Visit: Payer: Self-pay | Admitting: Family Medicine

## 2021-06-16 DIAGNOSIS — Z1231 Encounter for screening mammogram for malignant neoplasm of breast: Secondary | ICD-10-CM

## 2021-06-18 ENCOUNTER — Other Ambulatory Visit: Payer: Self-pay

## 2021-06-18 ENCOUNTER — Encounter: Payer: Self-pay | Admitting: Family Medicine

## 2021-06-18 ENCOUNTER — Ambulatory Visit (INDEPENDENT_AMBULATORY_CARE_PROVIDER_SITE_OTHER): Payer: Medicare HMO | Admitting: Family Medicine

## 2021-06-18 VITALS — BP 150/80 | HR 74 | Temp 98.1°F | Ht <= 58 in | Wt 125.5 lb

## 2021-06-18 DIAGNOSIS — M79671 Pain in right foot: Secondary | ICD-10-CM

## 2021-06-18 DIAGNOSIS — M79672 Pain in left foot: Secondary | ICD-10-CM

## 2021-06-18 DIAGNOSIS — M25532 Pain in left wrist: Secondary | ICD-10-CM

## 2021-06-18 DIAGNOSIS — M25531 Pain in right wrist: Secondary | ICD-10-CM | POA: Diagnosis not present

## 2021-06-18 DIAGNOSIS — S93522A Sprain of metatarsophalangeal joint of left great toe, initial encounter: Secondary | ICD-10-CM | POA: Diagnosis not present

## 2021-06-18 NOTE — Progress Notes (Signed)
Beth Iversen T. Charis Juliana, MD, [place] at [facility] [place], [address]  Phone: [phone]  FAX: [place] - 75 y.o. female  MRN [id number]  Date of Birth: May 17, 1946  Date: 06/18/2021  PCP: Ria Bush, MD  Referral: Ria Bush, MD  Chief Complaint  Patient presents with   Fall    Monday   Hand Pain    Bilateral   Foot Pain    Bilateral    This visit occurred during the SARS-CoV-2 public health emergency.  Safety protocols were in place, including screening questions prior to the visit, additional usage of staff PPE, and extensive cleaning of exam room while observing appropriate contact time as indicated for disinfecting solutions.   Subjective:   Beth Bailey is a 75 y.o. very pleasant female patient with Body mass index is 26.46 kg/m. who presents with the following:  Fall on Monday with bilateral foot and bilateral hand pain.  Fell 5 days ago on Sat.  Fell down and off of a deck.   Caught with hands Aside from falling on her hands, she is not sure about any specific direct impact, but she did not hit her head.  She is having pain in her hands, wrist, and toes as well as the forefoot. She is walking okay currently without any significant limp.  Toes are hurting, too.   She also has some pain in both wrists, and some pain a little bit more in the left basal joint.  R turf toe XR at imaging center  Review of Systems is noted in the HPI, as appropriate  Objective:   BP (!) 150/80   Pulse 74   Temp 98.1 F (36.7 C) (Temporal)   Ht 4' 9.75" (1.467 m)   Wt 125 lb 8 oz (56.9 kg)   SpO2 98%   BMI 26.46 kg/m   GEN: No acute distress; alert,appropriate. PULM: Breathing comfortably in no respiratory distress PSYCH: Normally interactive.   There is no bruising or swelling in the true wrist joints or in the distal radius.  [place].  She has full range of  motion with all of her fingers with no pain with axial loading or radial and ulnar deviation.  Grip is preserved and normal.  No neurological changes in the hands or wrist.  The right foot is entirely nontender from a bony anatomy standpoint, she has good movement at the toes and no tenderness in the midfoot, forefoot, or hindfoot.  Ankle is stable with no associated swelling, bruising or ligamentous instability or pain.  Left foot: Does have some talus pain as well as some pain in the midfoot, there is also some modest tenderness in the forefoot.  She does have a obvious turf toe at the first MTP.  Nontender to all ligaments.  Full range of motion at the ankle.  Does have some tenderness along the third and fourth metatarsal shafts as well as some pain with movement at the first MTP joint.  There is some pain with inversion and eversion.  Laboratory and Imaging Data:  Assessment and Plan:     ICD-10-CM   1. Acute foot pain, left  M79.672 DG Ankle Complete Left    2. Bilateral wrist pain  M25.531    M25.532     3. Bilateral foot pain  M79.671    M79.672     4. Turf toe of left foot  [id number]  I think that the hand pain appears to be modest, and I do not appreciate anything from an exam standpoint that would concern me for fracture.  In her bilateral foot exams, the patient does on the left side have more pain and she does have some tenderness at the ankle, and a turf toe with some minor tenderness in the metatarsals.  I am going to check an ankle film, and unfortunately it appears as if the patient has not gone to get her imaging.  I think that the risk of fracture is relatively low, and if she chooses not to follow-up with imaging and that is ultimately her choice.  Social: Right now her pain is limiting her somewhat with exercise and walking  Medications Discontinued During This Encounter  Medication Reason   ciprofloxacin (CIPRO) 750 MG tablet Completed Course   Orders  Placed This Encounter  Procedures   DG Ankle Complete Left    Follow-up: No follow-ups on file.  Dragon [employer] One speech-to-text software was used for transcription in this dictation.  Possible transcriptional errors can occur using Editor, commissioning.   Signed,  Maud Deed. Jenifer Struve, MD   Outpatient Encounter Medications as of 06/18/2021  Medication Sig   atorvastatin (LIPITOR) 40 MG tablet Take 1 tablet (40 mg total) by mouth daily at 6 PM.   Biotin 5000 MCG CAPS Take 5,000 mcg by mouth 2 (two) times a week. As needed   cetirizine (ZYRTEC) 10 MG tablet Take 10 mg by mouth at bedtime.    Cholecalciferol (VITAMIN D3) 25 MCG (1000 UT) CAPS Take 1 capsule (1,000 Units total) by mouth daily.   gabapentin (NEURONTIN) 300 MG capsule TAKE 1 CAPSULE BY MOUTH TWICE A DAY   HYDROcodone-acetaminophen (NORCO/VICODIN) 5-325 MG tablet TAKE 1 TABLET BY MOUTH EVERY 6 HOURS AS NEEDED FOR PAIN.   losartan (COZAAR) 50 MG tablet Take 1 tablet (50 mg total) by mouth daily.   loteprednol (LOTEMAX) 0.5 % ophthalmic suspension 1 drop. At bedtime   METAMUCIL FIBER PO Take by mouth as needed.   metroNIDAZOLE (FLAGYL) 500 MG tablet Take 1 tablet (500 mg total) by mouth 3 (three) times daily.   MISC NATURAL PRODUCTS PO Take by mouth as needed. Fast Acting Dairy Digestive supplement   Omega-3 Fatty Acids (FISH OIL PO) Take 1 capsule by mouth daily. OmegaXL - 2 capsule daily   omeprazole (PRILOSEC) 20 MG capsule Take 1 capsule (20 mg total) by mouth daily.   Polyethyl Glycol-Propyl Glycol 0.4-0.3 % SOLN Place 1 drop into both eyes 3 (three) times daily as needed (for dry eyes).   sertraline (ZOLOFT) 100 MG tablet Take 1 tablet (100 mg total) by mouth daily.   valACYclovir (VALTREX) 1000 MG tablet Take 1 tablet (1,000 mg total) by mouth at bedtime.   vitamin B-12 (CYANOCOBALAMIN) 1000 MCG tablet Take 1,000 mcg by mouth 3 (three) times a week.    [DISCONTINUED] ciprofloxacin (CIPRO) 750 MG tablet Take 1 tablet (750 mg  total) by mouth 2 (two) times daily.   No facility-administered encounter medications on file as of 06/18/2021.

## 2021-06-19 ENCOUNTER — Ambulatory Visit
Admission: RE | Admit: 2021-06-19 | Discharge: 2021-06-19 | Disposition: A | Discharge status: Home / Self Care | Payer: Medicare HMO | Source: Ambulatory Visit | Attending: Family Medicine | Admitting: Family Medicine

## 2021-06-19 ENCOUNTER — Ambulatory Visit: Payer: Medicare HMO

## 2021-06-19 ENCOUNTER — Ambulatory Visit: Admission: RE | Admit: 2021-06-19 | Payer: Medicare HMO | Source: Home / Self Care | Admitting: *Deleted

## 2021-06-19 ENCOUNTER — Ambulatory Visit
Admission: RE | Admit: 2021-06-19 | Discharge: 2021-06-19 | Disposition: A | Discharge status: Home / Self Care | Payer: Medicare HMO | Attending: Family Medicine | Admitting: Family Medicine

## 2021-06-19 DIAGNOSIS — M79672 Pain in left foot: Secondary | ICD-10-CM

## 2021-07-08 DIAGNOSIS — M25532 Pain in left wrist: Secondary | ICD-10-CM | POA: Diagnosis not present

## 2021-07-08 DIAGNOSIS — M48062 Spinal stenosis, lumbar region with neurogenic claudication: Secondary | ICD-10-CM | POA: Diagnosis not present

## 2021-07-08 DIAGNOSIS — M5136 Other intervertebral disc degeneration, lumbar region: Secondary | ICD-10-CM | POA: Diagnosis not present

## 2021-07-08 DIAGNOSIS — M5126 Other intervertebral disc displacement, lumbar region: Secondary | ICD-10-CM | POA: Diagnosis not present

## 2021-07-08 DIAGNOSIS — Z79899 Other long term (current) drug therapy: Secondary | ICD-10-CM | POA: Diagnosis not present

## 2021-07-08 DIAGNOSIS — M5416 Radiculopathy, lumbar region: Secondary | ICD-10-CM | POA: Diagnosis not present

## 2021-07-09 DIAGNOSIS — Z79899 Other long term (current) drug therapy: Secondary | ICD-10-CM | POA: Diagnosis not present

## 2021-07-14 ENCOUNTER — Encounter: Payer: Self-pay | Admitting: Family Medicine

## 2021-07-14 ENCOUNTER — Telehealth: Payer: Self-pay

## 2021-07-14 ENCOUNTER — Telehealth: Payer: Self-pay | Admitting: Family Medicine

## 2021-07-14 DIAGNOSIS — M1120 Other chondrocalcinosis, unspecified site: Secondary | ICD-10-CM | POA: Insufficient documentation

## 2021-07-14 NOTE — Progress Notes (Addendum)
Chronic Care Management Pharmacy Assistant   Name: Beth Bailey  MRN: [id number] DOB: [birth year]  Reason for Encounter: Hypertension Disease State   Recent office visits:  None since last CCM contact  Recent consult visits:  07/08/2021 - Bertram Millard, DO - Patient presented for follow up of acute chronic low back pain. No medication changes.  06/18/2021 Donella Stade, MD - Patient presented for fall with bilateral foot and bilateral hand pain.  Ordered: DG Ankle Complete Left. Stop: ciprofloxacin (CIPRO) 750 MG tablet as course has been taken.  05/11/2021 - Donella Stade, MD - Patient presented for abdominal pain and diarrhea. Started: ciprofloxacin (CIPRO) 750 MG tablet and metroNIDAZOLE (FLAGYL) 500 MG tablet. Stopped: ondansetron (ZOFRAN-ODT) 4 MG disintegrating tablet as patient reported as not taking.   Hospital visits:  None in previous 6 months  Medications: Outpatient Encounter Medications as of 07/14/2021  Medication Sig   atorvastatin (LIPITOR) 40 MG tablet Take 1 tablet (40 mg total) by mouth daily at 6 PM.   Biotin 5000 MCG CAPS Take 5,000 mcg by mouth 2 (two) times a week. As needed   cetirizine (ZYRTEC) 10 MG tablet Take 10 mg by mouth at bedtime.    Cholecalciferol (VITAMIN D3) 25 MCG (1000 UT) CAPS Take 1 capsule (1,000 Units total) by mouth daily.   gabapentin (NEURONTIN) 300 MG capsule TAKE 1 CAPSULE BY MOUTH TWICE A DAY   HYDROcodone-acetaminophen (NORCO/VICODIN) 5-325 MG tablet TAKE 1 TABLET BY MOUTH EVERY 6 HOURS AS NEEDED FOR PAIN.   losartan (COZAAR) 50 MG tablet Take 1 tablet (50 mg total) by mouth daily.   loteprednol (LOTEMAX) 0.5 % ophthalmic suspension 1 drop. At bedtime   METAMUCIL FIBER PO Take by mouth as needed.   metroNIDAZOLE (FLAGYL) 500 MG tablet Take 1 tablet (500 mg total) by mouth 3 (three) times daily.   MISC NATURAL PRODUCTS PO Take by mouth as needed. Fast Acting Dairy Digestive supplement   Omega-3 Fatty Acids (FISH  OIL PO) Take 1 capsule by mouth daily. OmegaXL - 2 capsule daily   omeprazole (PRILOSEC) 20 MG capsule Take 1 capsule (20 mg total) by mouth daily.   Polyethyl Glycol-Propyl Glycol 0.4-0.3 % SOLN Place 1 drop into both eyes 3 (three) times daily as needed (for dry eyes).   sertraline (ZOLOFT) 100 MG tablet Take 1 tablet (100 mg total) by mouth daily.   valACYclovir (VALTREX) 1000 MG tablet Take 1 tablet (1,000 mg total) by mouth at bedtime.   vitamin B-12 (CYANOCOBALAMIN) 1000 MCG tablet Take 1,000 mcg by mouth 3 (three) times a week.    No facility-administered encounter medications on file as of 07/14/2021.    Recent Office Vitals: BP Readings from Last 3 Encounters:  06/18/21 (!) 150/80  05/11/21 140/90  04/17/21 130/66   Pulse Readings from Last 3 Encounters:  06/18/21 74  05/11/21 76  04/17/21 (!) 58    Wt Readings from Last 3 Encounters:  06/18/21 125 lb 8 oz (56.9 kg)  05/11/21 124 lb 8 oz (56.5 kg)  04/17/21 122 lb (55.3 kg)     Kidney Function Lab Results  Component Value Date/Time   CREATININE 0.74 05/11/2021 12:46 PM   CREATININE 0.69 02/25/2021 10:03 AM   GFR 79.14 05/11/2021 12:46 PM   GFRNONAA >60 01/05/2020 12:00 AM   GFRAA >60 01/05/2020 12:00 AM    BMP Latest Ref Rng & Units 05/11/2021 02/25/2021 02/13/2020  Glucose 70 - 99 mg/dL 115(H) 109(H) 110(H)  BUN 6 - 23  mg/dL 12 11 13   Creatinine 0.40 - 1.20 mg/dL 0.74 0.69 0.72  Sodium 135 - 145 mEq/L 141 143 140  Potassium 3.5 - 5.1 mEq/L 4.7 4.8 5.2 No hemolysis seen(H)  Chloride 96 - 112 mEq/L 104 105 103  CO2 19 - 32 mEq/L 30 30 32  Calcium 8.4 - 10.5 mg/dL 9.7 9.6 9.9   Contacted patient on 07/14/2021 to discuss hypertension disease state  Current antihypertensive regimen:  Losartan 50 mg - 1 tablet daily (increased May 2022)  Patient verbally confirms she is taking the above medications as directed. Yes  How often are you checking your Blood Pressure? Patient stated she does not check her BP at home.   Caffeine intake: Patient states she tries not to drink caffeine.  Salt intake: Does not add salt.  OTC medications including pseudoephedrine or NSAIDs? Tylenol 500mg  - 1 tablet at night if hurting.   What recent interventions/DTPs have been made by any provider to improve Blood Pressure control since last CPP Visit: Continue current medication. Monitor BP at home.   Any recent hospitalizations or ED visits since last visit with CPP? No  What diet changes have been made to improve Blood Pressure Control?  Patient states she has not made any diet changes.   What exercise is being done to improve your Blood Pressure Control?  Patient states she walks her [address] and across the road; works in the yard when she can.   Adherence Review: Is the patient currently on ACE/ARB medication? Yes Does the patient have >5 day gap between last estimated fill dates? Yes  Star Rating Drugs:  Medication:  Last Fill: Day Supply Atorvastatin 40mg  06/12/2021 90 - Patient states she has two bottles (one filled 03/04/2021 has "a bunch" remaining, but would not count and 06/12/2021 hasn't been opened) Losartan 50mg  05/18/2021 90   Care Gaps: Annual wellness visit in last year? No 03/04/2021 Most Recent BP reading: 150/80 on 06/18/2021  If Diabetic: Most recent A1C reading: 6.7 on 02/25/2021 Last eye exam / retinopathy screening: 2018 Last diabetic foot exam: 06/2019  07/31/2021 - Mammogram  Debbora Dus, CPP notified  Marijean Niemann, [place] Assistant [phone]  I have reviewed the care management and care coordination activities outlined in this encounter and I am certifying that I agree with the content of this note. No further action required.  Debbora Dus, PharmD Clinical Pharmacist [facility] at [facility] [phone]

## 2021-07-14 NOTE — Telephone Encounter (Signed)
Received note from [facility] Whitney Meeler about xray showing possible pseudogout.  Would offer appt for pt if interested, could do virtual if desired, to review this as well as possible treatment options.

## 2021-07-15 NOTE — Telephone Encounter (Signed)
Left message on voicemail for patient to call the office back. 

## 2021-07-16 NOTE — Telephone Encounter (Signed)
Lvm asking pt to call back.  Need to relay Dr. G's message.  

## 2021-07-20 NOTE — Telephone Encounter (Signed)
Lvm asking pt to call back.  Need to relay Dr. G's message.   Mailing a letter.  

## 2021-07-31 ENCOUNTER — Other Ambulatory Visit: Payer: Self-pay

## 2021-07-31 ENCOUNTER — Ambulatory Visit
Admission: RE | Admit: 2021-07-31 | Discharge: 2021-07-31 | Disposition: A | Discharge status: Home / Self Care | Payer: Medicare HMO | Source: Ambulatory Visit | Attending: Family Medicine | Admitting: Family Medicine

## 2021-07-31 DIAGNOSIS — Z1231 Encounter for screening mammogram for malignant neoplasm of breast: Secondary | ICD-10-CM | POA: Diagnosis not present

## 2021-08-15 ENCOUNTER — Other Ambulatory Visit: Payer: Self-pay | Admitting: Family Medicine

## 2021-08-24 ENCOUNTER — Telehealth: Payer: Self-pay

## 2021-08-24 NOTE — Telephone Encounter (Signed)
[facility] - Client TELEPHONE ADVICE RECORD [facility] Patient Name: Beth Bailey Gender: Female DOB: 1946-04-09 Age: 75 Y 9 M 21 D Return Phone Number: [phone] (Primary), [phone] (Secondary) Address: City/ State/ Zip: [place] [address] Client [facility] - Client Client Site [place] Physician Ria Bush - MD Contact Type Call Who Is Calling Patient / Member / Family / Caregiver Call Type Triage / Clinical Relationship To Patient Self Return Phone Number [phone] (Primary) Chief Complaint Arm Injury Reason for Call Symptomatic / Request for [place] states she fell 2 days ago and hit wall with her arm and it hurts worse today. Translation No Nurse Assessment Nurse: Ahorlu, RN, Shellia Cleverly Date/Time (Eastern Time): 08/24/2021 3:06:04 PM Confirm and document reason for call. If symptomatic, describe symptoms. ---caller states she has a mechanical fall arm landed on top of chair in axillary region left bruised chest wall under the breast area Does the patient have any new or worsening symptoms? ---Yes Will a triage be completed? ---Yes Related visit to physician within the last 2 weeks? ---No Does the PT have any chronic conditions? (i.e. diabetes, asthma, this includes High risk factors for pregnancy, etc.) ---No Is this a behavioral health or substance abuse call? ---No Guidelines Guideline Title Affirmed Question Affirmed Notes Nurse Date/Time (Eastern Time) Arm Injury [1] High-risk adult (e.g., age > 37 years, osteoporosis, chronic steroid use) AND [2] still hurts Ahorlu, RN, Shareese 08/24/2021 3:12:07 PM Disp. Time Eilene Ghazi Time) Disposition Final User 08/24/2021 3:18:12 PM See PCP within 24 Hours Yes Ahorlu, RN, Shellia Cleverly PLEASE NOTE: All timestamps contained within this report are represented as [place]  Standard Time. CONFIDENTIALTY NOTICE: This fax transmission is intended only for the addressee. It contains information that is legally privileged, confidential or otherwise protected from use or disclosure. If you are not the intended recipient, you are strictly prohibited from reviewing, disclosing, copying using or disseminating any of this information or taking any action in reliance on or regarding this information. If you have received this fax in error, please notify us immediately by telephone so that we can arrange for its return to [place]. Phone: [phone], Toll-Free: [phone], Fax: [phone] Page: 2 of 2 Call Id: [id number] [place] Disagree/Comply Comply Caller Understands Yes PreDisposition Go to ED Care Advice Given Per Guideline SEE PCP WITHIN 24 HOURS: * IF OFFICE WILL BE OPEN: You need to be examined within the next 24 hours. Call your doctor (or NP/PA) when the office opens and make an appointment. USE A COLD PACK FOR PAIN, SWELLING, OR BRUISING: CALL BACK IF: * Pain becomes severe * You become worse Comments User: Bing Neighbors, RN Date/Time (Eastern Time): 08/24/2021 3:21:11 PM caller prefers to got to urgent care center close to her instead of going to pcp at this time Referrals REFERRED TO PCP OFFIC

## 2021-08-24 NOTE — Telephone Encounter (Signed)
Please call tomorrow for an update on [facility] eval.

## 2021-08-24 NOTE — Telephone Encounter (Signed)
I spoke with pt; pt is going to [facility] now. Sending note to Dr Darnell Level and Lattie Haw CMA.

## 2021-08-25 DIAGNOSIS — S299XXA Unspecified injury of thorax, initial encounter: Secondary | ICD-10-CM | POA: Diagnosis not present

## 2021-08-25 DIAGNOSIS — R0781 Pleurodynia: Secondary | ICD-10-CM | POA: Diagnosis not present

## 2021-08-25 NOTE — Telephone Encounter (Signed)
Lvm asking pt to call back.  Need an update on pt's sxs.

## 2021-08-26 ENCOUNTER — Telehealth: Payer: Self-pay

## 2021-08-26 NOTE — Chronic Care Management (AMB) (Addendum)
Chronic Care Management Pharmacy Assistant   Name: Beth Bailey  MRN: [id number] DOB: 16-Jun-1946  Reason for Encounter:  Hypertension Disease State   Recent office visits:  None since last CCM contact  Recent consult visits:  None since last CCM contact  Hospital visits:  None in previous 6 months  Medications: Outpatient Encounter Medications as of 08/26/2021  Medication Sig   atorvastatin (LIPITOR) 40 MG tablet Take 1 tablet (40 mg total) by mouth daily at 6 PM.   Biotin 5000 MCG CAPS Take 5,000 mcg by mouth 2 (two) times a week. As needed   cetirizine (ZYRTEC) 10 MG tablet Take 10 mg by mouth at bedtime.    Cholecalciferol (VITAMIN D3) 25 MCG (1000 UT) CAPS Take 1 capsule (1,000 Units total) by mouth daily.   gabapentin (NEURONTIN) 300 MG capsule TAKE 1 CAPSULE BY MOUTH TWICE A DAY   HYDROcodone-acetaminophen (NORCO/VICODIN) 5-325 MG tablet TAKE 1 TABLET BY MOUTH EVERY 6 HOURS AS NEEDED FOR PAIN.   losartan (COZAAR) 50 MG tablet Take 1 tablet (50 mg total) by mouth daily.   loteprednol (LOTEMAX) 0.5 % ophthalmic suspension 1 drop. At bedtime   METAMUCIL FIBER PO Take by mouth as needed.   metroNIDAZOLE (FLAGYL) 500 MG tablet Take 1 tablet (500 mg total) by mouth 3 (three) times daily.   MISC NATURAL PRODUCTS PO Take by mouth as needed. Fast Acting Dairy Digestive supplement   Omega-3 Fatty Acids (FISH OIL PO) Take 1 capsule by mouth daily. OmegaXL - 2 capsule daily   omeprazole (PRILOSEC) 20 MG capsule Take 1 capsule (20 mg total) by mouth daily.   Polyethyl Glycol-Propyl Glycol 0.4-0.3 % SOLN Place 1 drop into both eyes 3 (three) times daily as needed (for dry eyes).   sertraline (ZOLOFT) 100 MG tablet Take 1 tablet (100 mg total) by mouth daily.   valACYclovir (VALTREX) 1000 MG tablet Take 1 tablet (1,000 mg total) by mouth at bedtime.   vitamin B-12 (CYANOCOBALAMIN) 1000 MCG tablet Take 1,000 mcg by mouth 3 (three) times a week.    No facility-administered  encounter medications on file as of 08/26/2021.    Recent Office Vitals: BP Readings from Last 3 Encounters:  06/18/21 (!) 150/80  05/11/21 140/90  04/17/21 130/66   Pulse Readings from Last 3 Encounters:  06/18/21 74  05/11/21 76  04/17/21 (!) 58    Wt Readings from Last 3 Encounters:  06/18/21 125 lb 8 oz (56.9 kg)  05/11/21 124 lb 8 oz (56.5 kg)  04/17/21 122 lb (55.3 kg)     Kidney Function Lab Results  Component Value Date/Time   CREATININE 0.74 05/11/2021 12:46 PM   CREATININE 0.69 02/25/2021 10:03 AM   GFR 79.14 05/11/2021 12:46 PM   GFRNONAA >60 01/05/2020 12:00 AM   GFRAA >60 01/05/2020 12:00 AM    BMP Latest Ref Rng & Units 05/11/2021 02/25/2021 02/13/2020  Glucose 70 - 99 mg/dL 115(H) 109(H) 110(H)  BUN 6 - 23 mg/dL 12 11 13   Creatinine 0.40 - 1.20 mg/dL 0.74 0.69 0.72  Sodium 135 - 145 mEq/L 141 143 140  Potassium 3.5 - 5.1 mEq/L 4.7 4.8 5.2 No hemolysis seen(H)  Chloride 96 - 112 mEq/L 104 105 103  CO2 19 - 32 mEq/L 30 30 32  Calcium 8.4 - 10.5 mg/dL 9.7 9.6 9.9     Contacted patient on 08/26/21 to discuss hypertension disease state  Current antihypertensive regimen:   Losartan 50mg  take 1 tablet daily  Patient verbally confirms she is taking the above medications as directed. Yes The patient reported she was not taking the whole tablet until our conversation.(03/04/21-Dr.G increased Losartan to 50mg  1 tablet daily)  How often are you checking your Blood Pressure? daily  sometimes  she checks her blood pressure in the morning before taking her medication. The patient will begin taking in the am and record readings for next month   Current home BP readings:   we connected on 08/31/21 and the patient has had company early for the Thanksgiving Holidays and forgot to take some readings.  Wrist or arm cuff: arm cuff Caffeine intake: on occasion she will have caffeine  Salt intake:limits with adding to food OTC medications including pseudoephedrine or  NSAIDs? The patient reports taking tylenol on occasion.  Any readings above 180/120? No  What recent interventions/DTPs have been made by any provider to improve Blood Pressure control since last CPP Visit: 02/2021  PCP increased Losartan to 50 mg take 1 tablet daily and the patient was unaware of this dose change. She will start taking the whole tablet.The patient will monitor her BP at home more often.  Any recent hospitalizations or ED visits since last visit with CPP? No  What diet changes have been made to improve Blood Pressure Control?   No diet changes identified.   What exercise is being done to improve your Blood Pressure Control?   The patient states she walks when she can and she works out in plants and flowers to stay active.  Adherence Review: Is the patient currently on ACE/ARB medication? Yes Does the patient have >5 day gap between last estimated fill dates? No  Star Rating Drugs:  Medication:  Last Fill: Day Supply Atorvastatin 40mg  06/12/21  90 Losartan 50mg  06/25/21 90  Care Gaps: Annual wellness visit in last year? Yes Most Recent BP reading:150/80  74-P  If Diabetic: Most recent A1C reading:6.7  02/25/21 Last eye exam / retinopathy screening:2018 Last diabetic foot exam:2020  No appointments scheduled within the next 30 days.  Debbora Dus, CPP notified  Avel Sensor, [place] Assistant [phone]  I have reviewed the care management and care coordination activities outlined in this encounter and I am certifying that I agree with the content of this note. No further action required.  Debbora Dus, PharmD Clinical Pharmacist [facility] at [facility] [phone]

## 2021-08-26 NOTE — Telephone Encounter (Signed)
Lvm asking pt to call back.  Need an update on pt's sxs.

## 2021-08-27 NOTE — Telephone Encounter (Addendum)
Lvm asking pt to call back.  Need an update on pt's sxs.

## 2021-08-28 NOTE — Telephone Encounter (Signed)
Lvm asking pt to call back.  Need an update on pt's sxs.

## 2021-08-31 NOTE — Telephone Encounter (Signed)
Lvm asking pt to call back.  Need an update on pt's sxs.

## 2021-09-01 NOTE — Telephone Encounter (Signed)
Mailing a letter.

## 2021-09-10 NOTE — Telephone Encounter (Signed)
Pt called in stated she was doing good . Did not want to make a follow up appointment

## 2021-09-21 ENCOUNTER — Inpatient Hospital Stay: Admission: RE | Admit: 2021-09-21 | Payer: Medicare HMO | Source: Ambulatory Visit

## 2021-09-23 ENCOUNTER — Telehealth: Payer: Self-pay | Admitting: Family Medicine

## 2021-09-23 ENCOUNTER — Inpatient Hospital Stay: Admission: RE | Admit: 2021-09-23 | Payer: Medicare HMO | Source: Ambulatory Visit

## 2021-09-23 NOTE — Telephone Encounter (Signed)
Pt called stating that she had an appt on 09/23/21 4:00 at the [facility]. Pt states when she got there they told her to go to [facility]. Please Advise.

## 2021-09-23 NOTE — Telephone Encounter (Signed)
I attempted to call this patient back.  It looks like she was scheduled for a Bone Denisty - I do not handle these  Not sure if what the issue was - the patient states that she showed up and they told her to go elsewhere (per initial message taken) and the appt notes state that she no showed.   Appointment Information  Name: Beth, Bailey MRN: [id number]  Date: 09/23/2021 Status: Can  Time: 4:00 PM Length: 30  Visit Type: DG DEXA [150200] Copay: $0.00  Provider: Rutherford Nail DX DEXA 1 Department: GI-[facility]G DIAGNOSTIC  Referring Provider: Ria Bush CSN: [id number]  Notes: TDD:[facility] Pf: 12/17/2014@[facility]  [facility]/no calc or multi-vit 48 hrs prior/no needs/clc/pt Epic order  Made On: Canceled: 09/23/2021 1:24 PM 09/23/2021 4:15 PM By: By: Levan Hurst COX, SARA E  Cancel Rsn: No Show/Cancel within 24 hours   Please follow up with patient as I do not deal with BD  thanks

## 2021-09-24 NOTE — Telephone Encounter (Signed)
Tried to call patient to see what happened and have her call to reschedule appt. There was no answer and could not leave message on VM.

## 2021-09-25 NOTE — Telephone Encounter (Signed)
Lvm asking pt to call back.  Pt needs to call and r/s her bone density.

## 2021-09-28 NOTE — Telephone Encounter (Signed)
Lvm asking pt to call back.  Pt needs to call and r/s her bone density.

## 2021-09-30 NOTE — Telephone Encounter (Signed)
Lvm asking pt to call back.  Pt needs to call and r/s her bone density.

## 2021-10-01 NOTE — Telephone Encounter (Addendum)
Lvm asking pt to call back.  Pt needs to call [facility] and r/s her bone density.

## 2021-10-06 NOTE — Telephone Encounter (Signed)
Lvm asking pt to call back.  Pt needs to call [facility] and r/s her bone density.  Mailing a letter.

## 2021-11-06 DIAGNOSIS — G8929 Other chronic pain: Secondary | ICD-10-CM | POA: Diagnosis not present

## 2021-11-06 DIAGNOSIS — M5416 Radiculopathy, lumbar region: Secondary | ICD-10-CM | POA: Diagnosis not present

## 2021-11-06 DIAGNOSIS — M5126 Other intervertebral disc displacement, lumbar region: Secondary | ICD-10-CM | POA: Diagnosis not present

## 2021-11-06 DIAGNOSIS — M25532 Pain in left wrist: Secondary | ICD-10-CM | POA: Diagnosis not present

## 2021-11-06 DIAGNOSIS — R1030 Lower abdominal pain, unspecified: Secondary | ICD-10-CM | POA: Diagnosis not present

## 2021-11-06 DIAGNOSIS — M5136 Other intervertebral disc degeneration, lumbar region: Secondary | ICD-10-CM | POA: Diagnosis not present

## 2021-11-06 DIAGNOSIS — M48062 Spinal stenosis, lumbar region with neurogenic claudication: Secondary | ICD-10-CM | POA: Diagnosis not present

## 2021-11-06 DIAGNOSIS — Z79899 Other long term (current) drug therapy: Secondary | ICD-10-CM | POA: Diagnosis not present

## 2021-11-18 ENCOUNTER — Telehealth: Payer: Self-pay

## 2021-11-18 NOTE — Chronic Care Management (AMB) (Addendum)
Chronic Care Management Pharmacy Assistant   Name: Gena Laski  MRN: [id number] DOB: [birth year]  Reason for Encounter:General Adherence    Conditions to be addressed/monitored: HTN and DMII  Recent office visits:  None since last CCM contact  Recent consult visits:  08/28/21-[facility] visits:  None in previous 6 months  Medications: Outpatient Encounter Medications as of 11/18/2021  Medication Sig   atorvastatin (LIPITOR) 40 MG tablet Take 1 tablet (40 mg total) by mouth daily at 6 PM.   Biotin 5000 MCG CAPS Take 5,000 mcg by mouth 2 (two) times a week. As needed   cetirizine (ZYRTEC) 10 MG tablet Take 10 mg by mouth at bedtime.    Cholecalciferol (VITAMIN D3) 25 MCG (1000 UT) CAPS Take 1 capsule (1,000 Units total) by mouth daily.   gabapentin (NEURONTIN) 300 MG capsule TAKE 1 CAPSULE BY MOUTH TWICE A DAY   HYDROcodone-acetaminophen (NORCO/VICODIN) 5-325 MG tablet TAKE 1 TABLET BY MOUTH EVERY 6 HOURS AS NEEDED FOR PAIN.   losartan (COZAAR) 50 MG tablet Take 1 tablet (50 mg total) by mouth daily.   loteprednol (LOTEMAX) 0.5 % ophthalmic suspension 1 drop. At bedtime   METAMUCIL FIBER PO Take by mouth as needed.   metroNIDAZOLE (FLAGYL) 500 MG tablet Take 1 tablet (500 mg total) by mouth 3 (three) times daily.   MISC NATURAL PRODUCTS PO Take by mouth as needed. Fast Acting Dairy Digestive supplement   Omega-3 Fatty Acids (FISH OIL PO) Take 1 capsule by mouth daily. OmegaXL - 2 capsule daily   omeprazole (PRILOSEC) 20 MG capsule Take 1 capsule (20 mg total) by mouth daily.   Polyethyl Glycol-Propyl Glycol 0.4-0.3 % SOLN Place 1 drop into both eyes 3 (three) times daily as needed (for dry eyes).   sertraline (ZOLOFT) 100 MG tablet Take 1 tablet (100 mg total) by mouth daily.   valACYclovir (VALTREX) 1000 MG tablet Take 1 tablet (1,000 mg total) by mouth at bedtime.   vitamin B-12 (CYANOCOBALAMIN) 1000 MCG tablet Take 1,000 mcg by mouth 3  (three) times a week.    No facility-administered encounter medications on file as of 11/18/2021.     Contacted [facility] on 11/20/21 for general disease state and medication adherence call.   Patient is more than 5 days past due for refill on the following medications per chart history:  Star Medications: Medication Name/mg Last Fill Days Supply Losartan 50mg   06/25/21 90      per [facility] (PAST DUE) Atorvastatin 40mg   09/13/21 9  What concerns do you have about your medications? No concerns at this time   The patient denies side effects with their medications.   How often do you forget or accidentally miss a dose? Rarely-  No gaps in statin adherence.  Do you use a pillbox? Yes  Puts out 1 week at a time   Are you having any problems getting your medications from your pharmacy? Currently unable to find hydrocodone locally, she is not totally out but uses it as needed for chronic back pain.  Has the cost of your medications been a concern?  The patient reports first of year her medications are a little higher in cost. Gabapentin price increased, however, it was a 90ds so that made it more affordable  Since last visit with CPP, the following interventions have been made.  The patient reports she has taken her BP intermittently and last one was 138/72. The patient is using a pillbox -  1 week at a time.  The patient has had an ED visit since last contact. The patient reports she went to local hospital for back and leg pain recently   The patient denies problems with their health. The patient reports she is doing well at this time   Patient denies concerns or questions for Debbora Dus, PharmD at this time. The patient is happy the office is open once again.  Counseled patient on:  Importance of taking medication daily without missed doses, Benefits of adherence packaging or a pillbox, and Access to CCM team for any cost, medication or pharmacy concerns.   Care  Gaps: Annual wellness visit in last year? Yes Most Recent BP reading: 150/80  74-P 06/18/21  If Diabetic: Most recent A1C reading: 6.7 02/25/21 Last eye exam / retinopathy screening:2018 Last diabetic foot exam: 2020  Upcoming appointments: CCM appointment on 12/15/21  Debbora Dus, CPP notified  Avel Sensor, [place] Assistant [phone]  I have reviewed the care management and care coordination activities outlined in this encounter and I am certifying that I agree with the content of this note. No further action required.  Debbora Dus, PharmD Clinical Pharmacist [facility] at [facility] [phone]

## 2021-11-19 ENCOUNTER — Other Ambulatory Visit: Payer: Self-pay | Admitting: Family Medicine

## 2021-11-23 NOTE — Telephone Encounter (Signed)
Plz address in Dr. Synthia Innocent absence.   Gabapentin Last filled:  09/14/21, #60 Last OV:  03/04/21, AWV Next OV:  none

## 2021-12-02 ENCOUNTER — Encounter: Payer: Self-pay | Admitting: Dermatology

## 2021-12-02 ENCOUNTER — Ambulatory Visit: Payer: Medicare HMO | Admitting: Dermatology

## 2021-12-02 ENCOUNTER — Other Ambulatory Visit: Payer: Self-pay

## 2021-12-02 DIAGNOSIS — L820 Inflamed seborrheic keratosis: Secondary | ICD-10-CM | POA: Diagnosis not present

## 2021-12-02 DIAGNOSIS — D229 Melanocytic nevi, unspecified: Secondary | ICD-10-CM

## 2021-12-02 DIAGNOSIS — D239 Other benign neoplasm of skin, unspecified: Secondary | ICD-10-CM

## 2021-12-02 DIAGNOSIS — D23112 Other benign neoplasm of skin of right lower eyelid, including canthus: Secondary | ICD-10-CM | POA: Diagnosis not present

## 2021-12-02 DIAGNOSIS — Q825 Congenital non-neoplastic nevus: Secondary | ICD-10-CM | POA: Diagnosis not present

## 2021-12-02 DIAGNOSIS — L814 Other melanin hyperpigmentation: Secondary | ICD-10-CM

## 2021-12-02 DIAGNOSIS — Z1283 Encounter for screening for malignant neoplasm of skin: Secondary | ICD-10-CM

## 2021-12-02 DIAGNOSIS — D2239 Melanocytic nevi of other parts of face: Secondary | ICD-10-CM | POA: Diagnosis not present

## 2021-12-02 DIAGNOSIS — Z85820 Personal history of malignant melanoma of skin: Secondary | ICD-10-CM

## 2021-12-02 DIAGNOSIS — D2261 Melanocytic nevi of right upper limb, including shoulder: Secondary | ICD-10-CM | POA: Diagnosis not present

## 2021-12-02 DIAGNOSIS — L578 Other skin changes due to chronic exposure to nonionizing radiation: Secondary | ICD-10-CM | POA: Diagnosis not present

## 2021-12-02 DIAGNOSIS — D1800 Hemangioma unspecified site: Secondary | ICD-10-CM

## 2021-12-02 DIAGNOSIS — L821 Other seborrheic keratosis: Secondary | ICD-10-CM

## 2021-12-02 NOTE — Progress Notes (Signed)
New Patient Visit  Subjective  Beth Bailey is a 76 y.o. female who presents for the following: Annual Exam (Here for skin cancer screening. Full body. Previous Dr. [name] patient. Hx of MM x2. Right back 2014, right thigh 2019. Treated with excisions ).  The patient presents for Total-Body Skin Exam (TBSE) for skin cancer screening and mole check.  The patient has spots, moles and lesions to be evaluated, some may be new or changing and the patient has concerns that these could be cancer.  Some are itchy and irritated on lower back.   Objective   Review of Systems: No other skin or systemic complaints except as noted in HPI or Assessment and Plan.   Well appearing patient in no apparent distress; mood and affect are within normal limits.    A full examination was performed including scalp, head, eyes, ears, nose, lips, neck, chest, axillae, abdomen, back, buttocks, bilateral upper extremities, bilateral lower extremities, hands, feet, fingers, toes, fingernails, and toenails. All findings within normal limits unless otherwise noted below.  Left Shoulder - Posterior Vascular patch  left spinal lower back x1,  right spinal lower back x1, left dorsal wrist x1 (3) Erythematous keratotic or waxy stuck-on papule   Right Shoulder, chin Flesh brown papule at right shoulder  Flesh papule at chin  Right Ala Nasi 53mm flesh papule  Right Lower Eyelid 5x3 mm white soft papule      Assessment & Plan   Lentigines - Scattered tan macules - Due to sun exposure - Benign-appearing, observe - Recommend daily broad spectrum sunscreen SPF 30+ to sun-exposed areas, reapply every 2 hours as needed. - Call for any changes  Seborrheic Keratoses - Stuck-on, waxy, tan-brown papules and/or plaques  - Benign-appearing - Discussed benign etiology and prognosis. - Observe - Call for any changes  Melanocytic Nevi - Tan-brown and/or pink-flesh-colored symmetric macules and  papules - Benign appearing on exam today - Observation - Call clinic for new or changing moles - Recommend daily use of broad spectrum spf 30+ sunscreen to sun-exposed areas.  -Check toenails and fingernails when remove polish.   Hemangiomas - Red papules - Discussed benign nature - Observe - Call for any changes  Actinic Damage - Chronic condition, secondary to cumulative UV/sun exposure - diffuse scaly erythematous macules with underlying dyspigmentation - Recommend daily broad spectrum sunscreen SPF 30+ to sun-exposed areas, reapply every 2 hours as needed.  - Staying in the shade or wearing long sleeves, sun glasses (UVA+UVB protection) and wide brim hats (4-inch brim around the entire circumference of the hat) are also recommended for sun protection.  - Call for new or changing lesions.  History of Melanoma - No evidence of recurrence today at right back and right thigh. Derm path states right thigh but scar is on right calf. No scar on right thigh. - Recommend regular full body skin exams - Recommend daily broad spectrum sunscreen SPF 30+ to sun-exposed areas, reapply every 2 hours as needed.  - Call if any new or changing lesions are noted between office visits   Skin cancer screening performed today.  Varicose Veins/Spider Veins - Dilated blue, purple or red veins at the lower extremities - Reassured - Smaller vessels can be treated by sclerotherapy (a procedure to inject a medicine into the veins to make them disappear) if desired, but the treatment is not covered by insurance. Larger vessels may be covered if symptomatic and we would refer to vascular surgeon if treatment desired.  Vascular birthmark Left Shoulder - Posterior  Benign-appearing.  Observation.  Call clinic for new or changing lesions.  Recommend daily use of broad spectrum spf 30+ sunscreen to sun-exposed areas.    Inflamed seborrheic keratosis (3) left spinal lower back x1,  right spinal lower back  x1, left dorsal wrist x1  Recheck spinal lower back on follow up.  Destruction of lesion - left spinal lower back x1,  right spinal lower back x1, left dorsal wrist x1  Destruction method: cryotherapy   Informed consent: discussed and consent obtained   Lesion destroyed using liquid nitrogen: Yes   Region frozen until ice ball extended beyond lesion: Yes   Outcome: patient tolerated procedure well with no complications   Post-procedure details: wound care instructions given   Additional details:  Prior to procedure, discussed risks of blister formation, small wound, skin dyspigmentation, or rare scar following cryotherapy. Recommend Vaseline ointment to treated areas while healing.   Nevus Right Shoulder, chin  Benign-appearing.  Observation.  Call clinic for new or changing lesions.  Recommend daily use of broad spectrum spf 30+ sunscreen to sun-exposed areas.    Fibrous papule of nose Right Ala Nasi  Benign-appearing.  Observation.  Call clinic for new or changing lesions.  Recommend daily use of broad spectrum spf 30+ sunscreen to sun-exposed areas.    Hydrocystoma Right Lower Eyelid  Vs SK vs scar  Benign-appearing. Recommend observation.  Return to clinic if changes.   Milia - tiny firm white papules at face - type of cyst - benign - may be extracted if symptomatic - observe   Return in about 6 months (around 06/01/2022) for TBSE.  ISK Follow Up and recheck eyelid in 2 months.  I, Beth Bailey, CMA, am acting as scribe for Beth Patty, MD.  Documentation: I have reviewed the above documentation for accuracy and completeness, and I agree with the above.  Beth Patty MD

## 2021-12-02 NOTE — Patient Instructions (Addendum)
Cryotherapy Aftercare  Wash gently with soap and water everyday.   Apply Vaseline and Band-Aid daily until healed.   Prior to procedure, discussed risks of blister formation, small wound, skin dyspigmentation, or rare scar following cryotherapy. Recommend Vaseline ointment to treated areas while healing.    Seborrheic Keratosis  What causes seborrheic keratoses? Seborrheic keratoses are harmless, common skin growths that first appear during adult life.  As time goes by, more growths appear.  Some people may develop a large number of them.  Seborrheic keratoses appear on both covered and uncovered body parts.  They are not caused by sunlight.  The tendency to develop seborrheic keratoses can be inherited.  They vary in color from skin-colored to gray, brown, or even black.  They can be either smooth or have a rough, warty surface.   Seborrheic keratoses are superficial and look as if they were stuck on the skin.  Under the microscope this type of keratosis looks like layers upon layers of skin.  That is why at times the top layer may seem to fall off, but the rest of the growth remains and re-grows.    Treatment Seborrheic keratoses do not need to be treated, but can easily be removed in the office.  Seborrheic keratoses often cause symptoms when they rub on clothing or jewelry.  Lesions can be in the way of shaving.  If they become inflamed, they can cause itching, soreness, or burning.  Removal of a seborrheic keratosis can be accomplished by freezing, burning, or surgery. If any spot bleeds, scabs, or grows rapidly, please return to have it checked, as these can be an indication of a skin cancer.     Recommend daily broad spectrum sunscreen SPF 30+ to sun-exposed areas, reapply every 2 hours as needed. Call for new or changing lesions.  Staying in the shade or wearing long sleeves, sun glasses (UVA+UVB protection) and wide brim hats (4-inch brim around the entire circumference of the hat) are  also recommended for sun protection.     Melanoma ABCDEs  Melanoma is the most dangerous type of skin cancer, and is the leading cause of death from skin disease.  You are more likely to develop melanoma if you: Have light-colored skin, light-colored eyes, or red or blond hair Spend a lot of time in the sun Tan regularly, either outdoors or in a tanning bed Have had blistering sunburns, especially during childhood Have a close family member who has had a melanoma Have atypical moles or large birthmarks  Early detection of melanoma is key since treatment is typically straightforward and cure rates are extremely high if we catch it early.   The first sign of melanoma is often a change in a mole or a new dark spot.  The ABCDE system is a way of remembering the signs of melanoma.  A for asymmetry:  The two halves do not match. B for border:  The edges of the growth are irregular. C for color:  A mixture of colors are present instead of an even brown color. D for diameter:  Melanomas are usually (but not always) greater than 78mm - the size of a pencil eraser. E for evolution:  The spot keeps changing in size, shape, and color.  Please check your skin once per month between visits. You can use a small mirror in front and a large mirror behind you to keep an eye on the back side or your body.   If you see any new or  changing lesions before your next follow-up, please call to schedule a visit.  Please continue daily skin protection including broad spectrum sunscreen SPF 30+ to sun-exposed areas, reapplying every 2 hours as needed when you're outdoors.   Staying in the shade or wearing long sleeves, sun glasses (UVA+UVB protection) and wide brim hats (4-inch brim around the entire circumference of the hat) are also recommended for sun protection.       If You Need Anything After Your Visit  If you have any questions or concerns for your doctor, please call our main line at [phone]  and press option 4 to reach your doctor's medical assistant. If no one answers, please leave a voicemail as directed and we will return your call as soon as possible. Messages left after 4 pm will be answered the following business day.   You may also send [place] a message via [place]. We typically respond to MyChart messages within 1-2 business days.  For prescription refills, please ask your pharmacy to contact our office. Our fax number is [phone].  If you have an urgent issue when the clinic is closed that cannot wait until the next business day, you can page your doctor at the number below.    Please note that while we do our best to be available for urgent issues outside of office hours, we are not available 24/7.   If you have an urgent issue and are unable to reach [place], you may choose to seek medical care at your doctor's office, retail clinic, urgent care center, or emergency room.  If you have a medical emergency, please immediately call 911 or go to the emergency department.  Pager Numbers  - Dr. Nehemiah Massed: [phone]  - Dr. Laurence Ferrari: [phone]  - Dr. Nicole Kindred: [phone]  In the event of inclement weather, please call our main line at [phone] for an update on the status of any delays or closures.  Dermatology Medication Tips: Please keep the boxes that topical medications come in in order to help keep track of the instructions about where and how to use these. Pharmacies typically print the medication instructions only on the boxes and not directly on the medication tubes.   If your medication is too expensive, please contact our office at [phone] option 4 or send [place] a message through [place].   We are unable to tell what your co-pay for medications will be in advance as this is different depending on your insurance coverage. However, we may be able to find a substitute medication at lower cost or fill out paperwork to get insurance to cover a needed medication.    If a prior authorization is required to get your medication covered by your insurance company, please allow [place] 1-2 business days to complete this process.  Drug prices often vary depending on where the prescription is filled and some pharmacies may offer cheaper prices.  The website www.goodrx.com contains coupons for medications through different pharmacies. The prices here do not account for what the cost may be with help from insurance (it may be cheaper with your insurance), but the website can give you the price if you did not use any insurance.  - You can print the associated coupon and take it with your prescription to the pharmacy.  - You may also stop by our office during regular business hours and pick up a GoodRx coupon card.  - If you need your prescription sent electronically to a different pharmacy, notify our office through [facility]  MyChart or by phone at [phone] option 4.     Si Usted Necesita Algo Despus de Su Visita  Tambin puede enviarnos un mensaje a travs de Pharmacist, community. Por lo general respondemos a los mensajes de MyChart en el transcurso de 1 a 2 das hbiles.  Para renovar recetas, por favor pida a su farmacia que se ponga en contacto con nuestra oficina. Harland Dingwall de fax es [place] [phone].  Si tiene un asunto urgente cuando la clnica est cerrada y que no puede esperar hasta el siguiente da hbil, puede llamar/localizar a su doctor(a) al nmero que aparece a continuacin.   Por favor, tenga en cuenta que aunque hacemos todo lo posible para estar disponibles para asuntos urgentes fuera del horario de [place], no estamos disponibles las 24 horas del da, los 7 das de la [place].   Si tiene un problema urgente y no puede comunicarse con nosotros, puede optar por buscar atencin mdica  en el consultorio de su doctor(a), en una clnica privada, en un centro de atencin urgente o en una sala de emergencias.  Si tiene Engineering geologist, por favor  llame inmediatamente al 911 o vaya a la sala de emergencias.  Nmeros de bper  - Dr. Nehemiah Massed: [phone]  - Dra. Moye: [phone]  - Dra. Nicole Kindred: [phone]  En caso de inclemencias del [place], por favor llame a Johnsie Kindred principal al [phone] para una actualizacin sobre el [place] de cualquier retraso o cierre.  Consejos para la medicacin en dermatologa: Por favor, guarde las cajas en las que vienen los medicamentos de uso tpico para ayudarle a seguir las instrucciones sobre dnde y cmo usarlos. Las farmacias generalmente imprimen las instrucciones del medicamento slo en las cajas y no directamente en los tubos del [place].   Si su medicamento es muy caro, por favor, pngase en contacto con Zigmund Daniel llamando al [phone] y presione la opcin 4 o envenos un mensaje a travs de Pharmacist, community.   No podemos decirle cul ser su copago por los medicamentos por adelantado ya que esto es diferente dependiendo de la cobertura de su seguro. Sin embargo, es posible que podamos encontrar un medicamento sustituto a Electrical engineer un formulario para que el seguro cubra el medicamento que se considera necesario.   Si se requiere una autorizacin previa para que su compaa de seguros [place] su medicamento, por favor permtanos de 1 a 2 das hbiles para completar este proceso.  Los precios de los medicamentos varan con frecuencia dependiendo del Environmental consultant de dnde se surte la receta y alguna farmacias pueden ofrecer precios ms baratos.  El sitio web www.goodrx.com tiene cupones para medicamentos de Airline pilot. Los precios aqu no tienen en cuenta lo que podra costar con la ayuda del seguro (puede ser ms barato con su seguro), pero el sitio web puede darle el precio si no utiliz Research scientist (physical sciences).  - Puede imprimir el cupn correspondiente y llevarlo con su receta a la farmacia.  - Tambin puede pasar por nuestra oficina durante el horario de atencin regular y  Charity fundraiser una tarjeta de cupones de GoodRx.  - Si necesita que su receta se enve electrnicamente a una farmacia diferente, informe a nuestra oficina a travs de MyChart de [place] o por telfono llamando al [phone] y presione la opcin 4.

## 2021-12-10 ENCOUNTER — Telehealth: Payer: Self-pay | Admitting: Family Medicine

## 2021-12-10 ENCOUNTER — Telehealth: Payer: Self-pay

## 2021-12-10 DIAGNOSIS — M85852 Other specified disorders of bone density and structure, left thigh: Secondary | ICD-10-CM

## 2021-12-10 NOTE — Progress Notes (Signed)
? ? ?  Chronic Care Management ?Pharmacy Assistant  ? ?Name: Beth Bailey  MRN: [id number] DOB: 1946-08-13 ? ?Reason for Encounter: CCM Counsellor) ? ?Medications: ?Outpatient Encounter Medications as of 12/10/2021  ?Medication Sig  ? atorvastatin (LIPITOR) 40 MG tablet Take 1 tablet (40 mg total) by mouth daily at 6 PM.  ? Biotin 5000 MCG CAPS Take 5,000 mcg by mouth 2 (two) times a week. As needed  ? cetirizine (ZYRTEC) 10 MG tablet Take 10 mg by mouth at bedtime.   ? Cholecalciferol (VITAMIN D3) 25 MCG (1000 UT) CAPS Take 1 capsule (1,000 Units total) by mouth daily.  ? gabapentin (NEURONTIN) 300 MG capsule TAKE 1 CAPSULE BY MOUTH TWICE A DAY  ? HYDROcodone-acetaminophen (NORCO/VICODIN) 5-325 MG tablet TAKE 1 TABLET BY MOUTH EVERY 6 HOURS AS NEEDED FOR PAIN.  ? losartan (COZAAR) 50 MG tablet Take 1 tablet (50 mg total) by mouth daily.  ? loteprednol (LOTEMAX) 0.5 % ophthalmic suspension 1 drop. At bedtime  ? METAMUCIL FIBER PO Take by mouth as needed.  ? metroNIDAZOLE (FLAGYL) 500 MG tablet Take 1 tablet (500 mg total) by mouth 3 (three) times daily.  ? MISC NATURAL PRODUCTS PO Take by mouth as needed. Fast Acting Dairy Digestive supplement  ? Omega-3 Fatty Acids (FISH OIL PO) Take 1 capsule by mouth daily. OmegaXL - 2 capsule daily  ? omeprazole (PRILOSEC) 20 MG capsule Take 1 capsule (20 mg total) by mouth daily.  ? Polyethyl Glycol-Propyl Glycol 0.4-0.3 % SOLN Place 1 drop into both eyes 3 (three) times daily as needed (for dry eyes).  ? sertraline (ZOLOFT) 100 MG tablet Take 1 tablet (100 mg total) by mouth daily.  ? valACYclovir (VALTREX) 1000 MG tablet Take 1 tablet (1,000 mg total) by mouth at bedtime.  ? vitamin B-12 (CYANOCOBALAMIN) 1000 MCG tablet Take 1,000 mcg by mouth 3 (three) times a week.   ? ?No facility-administered encounter medications on file as of 12/10/2021.  ? ?[name] was contacted to remind of upcoming telephone visit with [name] on 12/15/2021 at  8:45 am. Patient needed to reschedule her appointment. Patient has been rescheduled to 03/15/2022 at 3:45 pm as patient wanted a Monday appointment at 3:45 pm. Patient stated she is not having any issues with her medications at this time and has not questions or concerns for [name] so waiting until June would be fine. I gave patient my direct number so if any concerns arise she can call me directly in the meantime.  ? ?Star Rating Drugs: ?Medication:  Last Fill: Day Supply ?Atorvastatin 40 mg 09/13/2021 90 ?Losartan 50 mg 10/03/2021 90 ? ?[name], CPP notified ? ?Marijean Niemann, RMA ?Clinical Pharmacy Assistant ?[phone] ? ?Time Spent: 30 Minutes ?  ? ? ? ?

## 2021-12-10 NOTE — Telephone Encounter (Signed)
Pt requests rpt DEXA which is appropriate. ?I have ordered to be done at [facility] which is where she had previous bone density scan done. Let us know if desires to have done elsewhere.  ?Please call and schedule or give pt # to call and schedule.  ?

## 2021-12-11 NOTE — Telephone Encounter (Signed)
Lvm asking pt to call back.  Need to relay Dr. G's message.  

## 2021-12-14 NOTE — Telephone Encounter (Signed)
Patient notified as instructed by telephone and verbalized understanding. Patient was given the number to call to set up the appointment. ?[phone] ?

## 2021-12-15 ENCOUNTER — Telehealth: Payer: Medicare HMO

## 2021-12-16 ENCOUNTER — Other Ambulatory Visit: Payer: Medicare HMO

## 2021-12-16 DIAGNOSIS — H2512 Age-related nuclear cataract, left eye: Secondary | ICD-10-CM | POA: Diagnosis not present

## 2021-12-16 DIAGNOSIS — T85398D Other mechanical complication of other ocular prosthetic devices, implants and grafts, subsequent encounter: Secondary | ICD-10-CM | POA: Diagnosis not present

## 2021-12-16 DIAGNOSIS — C441022 Unspecified malignant neoplasm of skin of right lower eyelid, including canthus: Secondary | ICD-10-CM | POA: Diagnosis not present

## 2021-12-16 DIAGNOSIS — Z961 Presence of intraocular lens: Secondary | ICD-10-CM | POA: Diagnosis not present

## 2021-12-22 ENCOUNTER — Other Ambulatory Visit: Payer: Self-pay

## 2021-12-22 ENCOUNTER — Ambulatory Visit (INDEPENDENT_AMBULATORY_CARE_PROVIDER_SITE_OTHER)
Admission: RE | Admit: 2021-12-22 | Discharge: 2021-12-22 | Disposition: A | Discharge status: Home / Self Care | Payer: Medicare HMO | Source: Ambulatory Visit | Attending: Family Medicine | Admitting: Family Medicine

## 2021-12-22 DIAGNOSIS — M85852 Other specified disorders of bone density and structure, left thigh: Secondary | ICD-10-CM | POA: Diagnosis not present

## 2021-12-30 ENCOUNTER — Other Ambulatory Visit: Payer: Self-pay | Admitting: Family Medicine

## 2021-12-31 NOTE — Telephone Encounter (Signed)
E-scribed refill.  Plz schedule lab and cpe visits after 03/04/22.  ?

## 2021-12-31 NOTE — Telephone Encounter (Signed)
Gabapentin ?Last filled:  11/23/21, #60 ?Last OV:  03/04/21, AWV ?Next OV: none ?

## 2022-02-02 ENCOUNTER — Ambulatory Visit: Payer: Medicare HMO | Admitting: Dermatology

## 2022-02-02 DIAGNOSIS — L821 Other seborrheic keratosis: Secondary | ICD-10-CM

## 2022-02-02 DIAGNOSIS — D23112 Other benign neoplasm of skin of right lower eyelid, including canthus: Secondary | ICD-10-CM

## 2022-02-02 DIAGNOSIS — D239 Other benign neoplasm of skin, unspecified: Secondary | ICD-10-CM

## 2022-02-02 DIAGNOSIS — L814 Other melanin hyperpigmentation: Secondary | ICD-10-CM | POA: Diagnosis not present

## 2022-02-02 NOTE — Patient Instructions (Addendum)

## 2022-02-02 NOTE — Progress Notes (Signed)
? ?  Follow-Up Visit ?  ?Subjective  ?Beth Bailey is a 76 y.o. female who presents for the following: inflamed seborrheic keratosis (Hx of isk. Here to recheck a isk that was treated with Ln2 at right lower back. ) and Other (Here to recheck area at right lower eyelid ).  No changes that she has noticed. ?  ? ?The following portions of the chart were reviewed this encounter and updated as appropriate:   ?  ? ?Review of Systems: No other skin or systemic complaints except as noted in HPI or Assessment and Plan. ? ? ?Objective  ?Well appearing patient in no apparent distress; mood and affect are within normal limits. ? ?Right lower back was clear today at exam. ISK resolved. ? ?A focused examination was performed including right lower eyelid, back. Relevant physical exam findings are noted in the Assessment and Plan. ? ?right mid lower eyelid ?6 x 4 mm white soft papule right mid lower eyelid- no change when compared to baseline photo ?  ? ? 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 ?+/- xanthelasma, doubt malignancy, stable from previous exam ? ?Benign-appearing.  Observation.  Will continue to monitor.  Call clinic if any changes ? ? ? ?Seborrheic Keratoses ?- Stuck-on, waxy, tan-brown papules and/or plaques  ?- Benign-appearing ?- Discussed benign etiology and prognosis. ?- Observe ?- Call for any changes ? ?Lentigines ?- Scattered tan macules ?- Due to sun exposure ?- Benign-appering, observe ?- Recommend daily broad spectrum sunscreen SPF 30+ to sun-exposed areas, reapply every 2 hours as needed. ?- Call for any changes  ? ?Return in about 6 months (around 08/04/2022) for recheck at right lower eyelid . ?I, Ruthell Rummage, CMA, am acting as scribe for [name], MD. ? ?Documentation: I have reviewed the above documentation for accuracy and completeness, and I agree with the above. ? ?[name] MD  ? ?

## 2022-02-09 ENCOUNTER — Ambulatory Visit (INDEPENDENT_AMBULATORY_CARE_PROVIDER_SITE_OTHER): Payer: Medicare HMO | Admitting: Family Medicine

## 2022-02-09 ENCOUNTER — Encounter: Payer: Self-pay | Admitting: Family Medicine

## 2022-02-09 VITALS — BP 150/64 | HR 73 | Temp 98.3°F | Wt 126.5 lb

## 2022-02-09 DIAGNOSIS — I10 Essential (primary) hypertension: Secondary | ICD-10-CM

## 2022-02-09 DIAGNOSIS — M542 Cervicalgia: Secondary | ICD-10-CM | POA: Diagnosis not present

## 2022-02-09 DIAGNOSIS — Z636 Dependent relative needing care at home: Secondary | ICD-10-CM

## 2022-02-09 DIAGNOSIS — B0050 Herpesviral ocular disease, unspecified: Secondary | ICD-10-CM | POA: Diagnosis not present

## 2022-02-09 MED ORDER — LOSARTAN POTASSIUM 50 MG PO TABS: 50.0000 mg | ORAL_TABLET | Freq: Every day | ORAL | 3 refills | Status: DC

## 2022-02-09 MED ORDER — GABAPENTIN 300 MG PO CAPS: 300.0000 mg | ORAL_CAPSULE | Freq: Two times a day (BID) | ORAL | 3 refills | Status: DC

## 2022-02-09 MED ORDER — SERTRALINE HCL 100 MG PO TABS: 100.0000 mg | ORAL_TABLET | Freq: Every day | ORAL | 3 refills | Status: DC

## 2022-02-09 MED ORDER — VALACYCLOVIR HCL 1 G PO TABS: 1000.0000 mg | ORAL_TABLET | Freq: Every day | ORAL | 3 refills | Status: DC

## 2022-02-09 NOTE — Assessment & Plan Note (Signed)
Anticipate related to cervical DDD.  ?Continue gabapentin, discussed possible increased dosing.  ?rec continue PM&R f/u.  ?

## 2022-02-09 NOTE — Assessment & Plan Note (Signed)
Chronic, elevated readings noted today however she notes home readings largely well controlled. No med changes made today.  ?

## 2022-02-09 NOTE — Assessment & Plan Note (Addendum)
S/p corneal transplant, on valtrex '1000mg'$  nightly preventatively ?

## 2022-02-09 NOTE — Patient Instructions (Signed)
BP is staying too high - continue monitoring at home, et me know if consistently >140/90  ?Continue seeing Dr Sharlet Salina for back/neck. Consider increased gabapentin dose.  ?Meds refilled today ?Return as needed or in 2-3 months for physical/wellness visit with labs  ?

## 2022-02-09 NOTE — Assessment & Plan Note (Signed)
H/o this - both husband and mother have since passed away. Consider sertraline taper.  ?

## 2022-02-09 NOTE — Progress Notes (Signed)
? ? Patient ID: Beth Bailey, female    DOB: [birth year], 76 y.o.   MRN: [id number] ? ?This visit was conducted in person. ? ?BP (!) 150/64   Pulse 73   Temp 98.3 ?F (36.8 ?C) (Temporal)   Wt 126 lb 8 oz (57.4 kg)   SpO2 97%   BMI 26.67 kg/m?   ?160s/70s on repeat testing ? ?CC: med refill visit  ?Subjective:  ? ?HPI: ?Beth Bailey is a 76 y.o. female presenting on 02/09/2022 for No chief complaint on file. ? ? ?1 month early for wellness visit. ? ?Possible diverticulitis 05/2021, seen by Dr. Edilia Bo and treated with Cipro Flagyl course. ? ?Chronic back pain - sees PM&R. Struggling with neck and left shoulder pain. Also takes hydrocodone PRN.  ?She requests gabapentin refilled. Takes '300mg'$  BID for chronic back pain - with benefit. No sleepiness or leg swelling on it.  ? ?She also takes valtrex '1000mg'$  nightly preventatively after R corneal transplant surgery at [facility] (2012). Planning to re establish with Dr Katy Fitch or [facility].  ? ?HTN - Compliant with current antihypertensive regimen of losartan '50mg'$  daily.  Does check blood pressures at home: well controlled. No low blood pressure readings or symptoms of dizziness/syncope. Denies HA, vision changes, CP/tightness, SOB, leg swelling.  ? ?Notes more difficulty remembering things.  ?Patient manages medications, daughter manages finances.  ?   ? ?Relevant past medical, surgical, family and social history reviewed and updated as indicated. Interim medical history since our last visit reviewed. ?Allergies and medications reviewed and updated. ?Outpatient Medications Prior to Visit  ?Medication Sig Dispense Refill  ? atorvastatin (LIPITOR) 40 MG tablet TAKE 1 TABLET BY MOUTH DAILY AT 6 PM. 90 tablet 0  ? Biotin 5000 MCG CAPS Take 5,000 mcg by mouth 2 (two) times a week. As needed    ? cetirizine (ZYRTEC) 10 MG tablet Take 10 mg by mouth at bedtime.     ? Cholecalciferol (VITAMIN D3) 25 MCG (1000 UT) CAPS Take 1 capsule (1,000 Units total) by mouth daily.  30 capsule   ? HYDROcodone-acetaminophen (NORCO/VICODIN) 5-325 MG tablet TAKE 1 TABLET BY MOUTH EVERY 6 HOURS AS NEEDED FOR PAIN. 30 tablet 0  ? loteprednol (LOTEMAX) 0.5 % ophthalmic suspension 1 drop. At bedtime    ? METAMUCIL FIBER PO Take by mouth as needed.    ? MISC NATURAL PRODUCTS PO Take by mouth as needed. Fast Acting Dairy Digestive supplement    ? Omega-3 Fatty Acids (FISH OIL PO) Take 1 capsule by mouth daily. OmegaXL - 2 capsule daily    ? omeprazole (PRILOSEC) 20 MG capsule Take 1 capsule (20 mg total) by mouth daily. 90 capsule 3  ? Polyethyl Glycol-Propyl Glycol 0.4-0.3 % SOLN Place 1 drop into both eyes 3 (three) times daily as needed (for dry eyes).    ? vitamin B-12 (CYANOCOBALAMIN) 1000 MCG tablet Take 1,000 mcg by mouth 3 (three) times a week.     ? gabapentin (NEURONTIN) 300 MG capsule TAKE 1 CAPSULE BY MOUTH TWICE A DAY 60 capsule 0  ? losartan (COZAAR) 50 MG tablet Take 1 tablet (50 mg total) by mouth daily. 90 tablet 3  ? metroNIDAZOLE (FLAGYL) 500 MG tablet Take 1 tablet (500 mg total) by mouth 3 (three) times daily. 30 tablet 0  ? sertraline (ZOLOFT) 100 MG tablet Take 1 tablet (100 mg total) by mouth daily. 90 tablet 3  ? valACYclovir (VALTREX) 1000 MG tablet Take 1 tablet (1,000 mg total) by mouth  at bedtime. 90 tablet 0  ? ?No facility-administered medications prior to visit.  ?  ? ?Per HPI unless specifically indicated in ROS section below ?Review of Systems ? ?Objective:  ?BP (!) 150/64   Pulse 73   Temp 98.3 ?F (36.8 ?C) (Temporal)   Wt 126 lb 8 oz (57.4 kg)   SpO2 97%   BMI 26.67 kg/m?   ?Wt Readings from Last 3 Encounters:  ?02/09/22 126 lb 8 oz (57.4 kg)  ?06/18/21 125 lb 8 oz (56.9 kg)  ?05/11/21 124 lb 8 oz (56.5 kg)  ?  ?  ?Physical Exam ?Vitals and nursing note reviewed.  ?Constitutional:   ?   Appearance: Normal appearance. She is not ill-appearing.  ?Cardiovascular:  ?   Rate and Rhythm: Normal rate and regular rhythm.  ?   Pulses: Normal pulses.  ?   Heart sounds:  Normal heart sounds. No murmur heard. ?Pulmonary:  ?   Effort: Pulmonary effort is normal. No respiratory distress.  ?   Breath sounds: Normal breath sounds. No wheezing, rhonchi or rales.  ?Musculoskeletal:  ?   Right lower leg: No edema.  ?   Left lower leg: No edema.  ?Skin: ?   General: Skin is warm and dry.  ?   Findings: No rash.  ?Neurological:  ?   Mental Status: She is alert.  ?Psychiatric:     ?   Mood and Affect: Mood normal.     ?   Behavior: Behavior normal.  ? ?   ?Lab Results  ?Component Value Date  ? HGBA1C 6.7 (H) 02/25/2021  ? ? ?Assessment & Plan:  ? ?Problem List Items Addressed This Visit   ? ? Essential hypertension - Primary  ?  Chronic, elevated readings noted today however she notes home readings largely well controlled. No med changes made today.  ? ?  ?  ? Relevant Medications  ? losartan (COZAAR) 50 MG tablet  ? Herpes simplex of eye  ?  S/p corneal transplant, on valtrex '1000mg'$  nightly preventatively ? ?  ?  ? Relevant Medications  ? valACYclovir (VALTREX) 1000 MG tablet  ? Caregiver burden  ?  H/o this - both husband and mother have since passed away. Consider sertraline taper.  ? ?  ?  ? Neck pain on right side  ?  Anticipate related to cervical DDD.  ?Continue gabapentin, discussed possible increased dosing.  ?rec continue PM&R f/u.  ? ?  ?  ?  ? ?Meds ordered this encounter  ?Medications  ? gabapentin (NEURONTIN) 300 MG capsule  ?  Sig: Take 1 capsule (300 mg total) by mouth 2 (two) times daily.  ?  Dispense:  180 capsule  ?  Refill:  3  ? losartan (COZAAR) 50 MG tablet  ?  Sig: Take 1 tablet (50 mg total) by mouth daily.  ?  Dispense:  90 tablet  ?  Refill:  3  ? sertraline (ZOLOFT) 100 MG tablet  ?  Sig: Take 1 tablet (100 mg total) by mouth daily.  ?  Dispense:  90 tablet  ?  Refill:  3  ? valACYclovir (VALTREX) 1000 MG tablet  ?  Sig: Take 1 tablet (1,000 mg total) by mouth at bedtime.  ?  Dispense:  90 tablet  ?  Refill:  3  ? ?No orders of the defined types were placed in this  encounter. ? ? ? ?Patient Instructions  ?BP is staying too high - continue monitoring at home, et  me know if consistently >140/90  ?Continue seeing Dr Sharlet Salina for back/neck. Consider increased gabapentin dose.  ?Meds refilled today ?Return as needed or in 2-3 months for physical/wellness visit with labs  ? ?Follow up plan: ?Return in about 3 months (around 05/12/2022) for annual exam, prior fasting for blood work, medicare wellness visit. ? ?Ria Bush, MD   ?

## 2022-03-11 ENCOUNTER — Telehealth: Payer: Self-pay

## 2022-03-11 ENCOUNTER — Ambulatory Visit (INDEPENDENT_AMBULATORY_CARE_PROVIDER_SITE_OTHER): Payer: Medicare HMO

## 2022-03-11 VITALS — Ht 59.0 in | Wt 115.0 lb

## 2022-03-11 DIAGNOSIS — Z0000 Encounter for general adult medical examination without abnormal findings: Secondary | ICD-10-CM

## 2022-03-11 NOTE — Progress Notes (Signed)
I connected with Beth Bailey today by telephone and verified that I am speaking with the correct person using two identifiers. Location patient: home Location provider: work Persons participating in the virtual visit: Kierria, Feigenbaum LPN.   I discussed the limitations, risks, security and privacy concerns of performing an evaluation and management service by telephone and the availability of in person appointments. I also discussed with the patient that there may be a patient responsible charge related to this service. The patient expressed understanding and verbally consented to this telephonic visit.    Interactive audio and video telecommunications were attempted between this provider and patient, however failed, due to patient having technical difficulties OR patient did not have access to video capability.  We continued and completed visit with audio only.     Vital signs may be patient reported or missing.  Subjective:   Beth Bailey is a 76 y.o. female who presents for Medicare Annual (Subsequent) preventive examination.  Review of Systems     Cardiac Risk Factors include: advanced age (>65mn, >>75women);diabetes mellitus;hypertension     Objective:    Today's Vitals   03/11/22 1515 03/11/22 1516  Weight: 115 lb (52.2 kg)   Height: '4\' 11"'$  (1.499 m)   PainSc:  5    Body mass index is 23.23 kg/m.     03/11/2022    3:23 PM 01/04/2020   11:52 PM 12/08/2018    4:20 PM 11/30/2017   12:31 PM 05/28/2016   11:15 AM 05/16/2015   11:17 AM 12/07/2012   12:33 PM  Advanced Directives  Does Patient Have a Medical Advance Directive? Yes Yes No Yes Yes Yes Patient does not have advance directive  Type of Advance Directive [place]eping Water[place] will [place]iedmontLiving will  HBaringLiving will HSevilleLiving will [place]Living will   Does patient want to make changes to  medical advance directive?     No - Patient declined No - Patient declined   Copy of [place] Chart? No - copy requested No - copy requested  No - copy requested No - copy requested No - copy requested   Would patient like information on creating a medical advance directive?  No - Patient declined No - Patient declined        Current Medications (verified) Outpatient Encounter Medications as of 03/11/2022  Medication Sig   atorvastatin (LIPITOR) 40 MG tablet TAKE 1 TABLET BY MOUTH DAILY AT 6 PM.   Biotin 5000 MCG CAPS Take 5,000 mcg by mouth 2 (two) times a week. As needed   cetirizine (ZYRTEC) 10 MG tablet Take 10 mg by mouth at bedtime.    Cholecalciferol (VITAMIN D3) 25 MCG (1000 UT) CAPS Take 1 capsule (1,000 Units total) by mouth daily.   gabapentin (NEURONTIN) 300 MG capsule Take 1 capsule (300 mg total) by mouth 2 (two) times daily.   HYDROcodone-acetaminophen (NORCO/VICODIN) 5-325 MG tablet TAKE 1 TABLET BY MOUTH EVERY 6 HOURS AS NEEDED FOR PAIN.   losartan (COZAAR) 50 MG tablet Take 1 tablet (50 mg total) by mouth daily.   loteprednol (LOTEMAX) 0.5 % ophthalmic suspension 1 drop. At bedtime   METAMUCIL FIBER PO Take by mouth as needed.   MISC NATURAL PRODUCTS PO Take by mouth as needed. Fast Acting Dairy Digestive supplement   Omega-3 Fatty Acids (FISH OIL PO) Take 1 capsule by mouth daily. OmegaXL - 2 capsule daily  omeprazole (PRILOSEC) 20 MG capsule Take 1 capsule (20 mg total) by mouth daily.   Polyethyl Glycol-Propyl Glycol 0.4-0.3 % SOLN Place 1 drop into both eyes 3 (three) times daily as needed (for dry eyes).   sertraline (ZOLOFT) 100 MG tablet Take 1 tablet (100 mg total) by mouth daily.   valACYclovir (VALTREX) 1000 MG tablet Take 1 tablet (1,000 mg total) by mouth at bedtime.   vitamin B-12 (CYANOCOBALAMIN) 1000 MCG tablet Take 1,000 mcg by mouth 3 (three) times a week.    No facility-administered encounter medications on file as of 03/11/2022.     Allergies (verified) Amoxicillin, Atorvastatin, Diclofenac sodium, Erythromycin, Penicillins, Pravastatin, Simvastatin, Statins, Trifluridine, Latex, and Lisinopril   History: Past Medical History:  Diagnosis Date   Allergic rhinitis    prior on allergy shots   Anxiety    Arthritis    Carotid stenosis 06/19/2016   R <40%, L 40-59%, rpt 1 yr (06/2016)   Colon polyps    Community acquired pneumonia of right lower lobe of lung 01/09/2020   GERD (gastroesophageal reflux disease)    controlled with PPI   Herpes simplex of eye    right - blind s/p corneal transplant   History of colon polyps    History of hiatal hernia    History of melanoma 06/05/2013   Right back. MM, Superficial spreading. Clark's level II, Breslow's 0.48m. [facility]   HLD (hyperlipidemia)    intolerant of statins   HNP (herniated nucleus pulposus), lumbar 12/07/2012   s/p surgery   Hyperglycemia    pt is unsure about this   Hypertension    has taken herself off her medications    Lesion of spleen 2013   2cm, stable on recheck   Malignant melanoma of right thigh ([facility] 03/29/2018   breslow thickness 0.347m(Dr GrVeleta Miners  NAFLD (nonalcoholic fatty liver disease) 12/2013   by [place] Osteopenia 2016   dexa T -1.5 hip   Pneumonia    Past Surgical History:  Procedure Laterality Date   ABDOMINAL HYSTERECTOMY  1985   ovaries remain.  endometriosis, abnormal pap as well   CARPAL TUNNEL RELEASE Bilateral    CATARACT EXTRACTION Right 2013   CHOLECYSTECTOMY  2007   COLONOSCOPY  10/2010   polyp , mild divertic, int hem, rec rpt 5 yrs (GC[facility]lean P[facility]  CORNEAL TRANSPLANT Right 2012   dexa  2016   osteopenia T-1.5   LUMBAR LAMINECTOMY/DECOMPRESSION MICRODISCECTOMY Right 12/07/2012   Procedure: LUMBAR LAMINECTOMY/DECOMPRESSION MICRODISCECTOMY 1 LEVEL;  Surgeon: KyWinfield CunasMD; Laterality: Right;  Right lumbar five-sacral one diskectomy   NECK SURGERY  1990s   plate in neck   TONSILLECTOMY      Family History  Problem Relation Age of Onset   Alzheimer's disease Father    Colon cancer Sister [id number] Colon polyps Sister    Cancer Maternal Uncle        questionable type   Heart attack Maternal Uncle    Kidney disease Maternal Uncle    Lung cancer Paternal Uncle    Diabetes Maternal Grandmother    Dementia Maternal Grandmother    Stroke Maternal Grandmother    Parkinson's disease Maternal Grandfather    Arthritis Other    Liver disease Neg Hx    Esophageal cancer Neg Hx    Stomach cancer Neg Hx    Rectal cancer Neg Hx    Social History   Socioeconomic History   Marital status: Married  Spouse name: Not on file   Number of children: Not on file   Years of education: Not on file   Highest education level: Not on file  Occupational History   Not on file  Tobacco Use   Smoking status: Never   Smokeless tobacco: Never  Vaping Use   Vaping Use: Never used  Substance and Sexual Activity   Alcohol use: No   Drug use: Yes    Types: Hydrocodone   Sexual activity: Not Currently    Birth control/protection: Post-menopausal  Other Topics Concern   Not on file  Social History Narrative   Widow. Husband Ed passed away 2020-01-05. Has birds and grown dogs. One daughter - neighbor   Occupation: retired, worked at [employer]   Activity: planning on joining gym   Diet: fruits/vegetables daily, some water   1-2 caffeinated beverages daily   Social Determinants of Radio broadcast assistant Strain: Low Risk    Difficulty of Paying Living Expenses: Not hard at all  Food Insecurity: No Food Insecurity   Worried About Charity fundraiser in the Last Year: Never true   Arboriculturist in the Last Year: Never true  Transportation Needs: No Transportation Needs   Lack of Transportation (Medical): No   Lack of Transportation (Non-Medical): No  Physical Activity: Inactive   Days of Exercise per Week: 0 days   Minutes of Exercise per Session: 0 min  Stress: No  Stress Concern Present   Feeling of Stress : Not at all  Social Connections: Not on file    Tobacco Counseling Counseling given: Not Answered   Clinical Intake:  Pre-visit preparation completed: Yes  Pain : 0-10 Pain Score: 5  Pain Type: Chronic pain Pain Location: Back Pain Orientation: Lower Pain Descriptors / Indicators: Aching Pain Onset: More than a month ago Pain Frequency: Intermittent     Nutritional Status: BMI of 19-24  Normal Nutritional Risks: None Diabetes: Yes  How often do you need to have someone help you when you read instructions, pamphlets, or other written materials from your doctor or pharmacy?: 1 - Never What is the last grade level you completed in school?: 10th grade  Diabetic? Yes Nutrition Risk Assessment:  Has the patient had any N/V/D within the last 2 months?  No  Does the patient have any non-healing wounds?  No  Has the patient had any unintentional weight loss or weight gain?  No   Diabetes:  Is the patient diabetic?  Yes  If diabetic, was a CBG obtained today?  No  Did the patient bring in their glucometer from home?  No  How often do you monitor your CBG's? Does not.   Financial Strains and Diabetes Management:  Are you having any financial strains with the device, your supplies or your medication? No .  Does the patient want to be seen by Chronic Care Management for management of their diabetes?  No  Would the patient like to be referred to a Nutritionist or for Diabetic Management?  No   Diabetic Exams:  Diabetic Eye Exam: Overdue for diabetic eye exam. Pt has been advised about the importance in completing this exam. Patient advised to call and schedule an eye exam. Diabetic Foot Exam: Overdue, Pt has been advised about the importance in completing this exam. Pt is scheduled for diabetic foot exam on next appointment.   Interpreter Needed?: No  Information entered by :: NAllen LPN   Activities of Daily Living  03/11/2022    3:27 PM  In your present state of health, do you have any difficulty performing the following activities:  Hearing? 0  Vision? 0  Difficulty concentrating or making decisions? 1  Walking or climbing stairs? 0  Dressing or bathing? 0  Doing errands, shopping? 0  Preparing Food and eating ? N  Using the Toilet? N  In the past six months, have you accidently leaked urine? N  Do you have problems with loss of bowel control? N  Managing your Medications? N  Managing your Finances? N  Housekeeping or managing your Housekeeping? N    Patient Care Team: Ria Bush, MD as PCP - General (Family Medicine) Debbora Dus, [facility] as Pharmacist (Pharmacist)  Indicate any recent Medical Services you may have received from other than [facility] providers in the past year (date may be approximate).     Assessment:   This is a routine wellness examination for Beth Bailey.  Hearing/Vision screen Vision Screening - Comments:: Regular eye exams, [facility]  Dietary issues and exercise activities discussed: Current Exercise Habits: The patient does not participate in regular exercise at present   Goals Addressed             This Visit's Progress    Patient Stated       03/11/2022, no goals       Depression Screen    03/11/2022    3:27 PM 03/04/2021   11:20 AM 12/26/2019    1:26 PM 12/08/2018   12:04 PM 11/30/2017   12:30 PM 05/28/2016   11:17 AM 12/02/2014   10:37 AM  PHQ 2/9 Scores  PHQ - 2 Score 0 2 0 0 0 0 0  PHQ- 9 Score  5 2 0 0      Fall Risk    03/11/2022    3:26 PM 03/04/2021   10:18 AM 12/26/2019   12:34 PM 12/08/2018   12:04 PM 11/30/2017   12:30 PM  Fall Risk   Falls in the past year? 1  1 0 No  Comment tripped over things  [address] while assisting husband    Number falls in past yr: 1 1 0    Injury with Fall? 0 0 0    Risk for fall due to : Medication side effect      Follow up Falls evaluation completed;Education provided;Falls prevention discussed         FALL RISK PREVENTION PERTAINING TO THE HOME:  Any stairs in or around the home? Yes  If so, are there any without handrails? No  Home free of loose throw rugs in walkways, pet beds, electrical cords, etc? Yes  Adequate lighting in your home to reduce risk of falls? Yes   ASSISTIVE DEVICES UTILIZED TO PREVENT FALLS:  Life alert? No  Use of a cane, walker or w/c? No  Grab bars in the bathroom? Yes  Shower chair or bench in shower? Yes  Elevated toilet seat or a handicapped toilet? Yes   TIMED UP AND GO:  Was the test performed? No .  .     Cognitive Function:    12/08/2018   12:04 PM 11/30/2017   12:30 PM 05/28/2016   11:42 AM  MMSE - Mini Mental State Exam  Orientation to time '5 5 5  '$ Orientation to Place '5 5 5  '$ Registration '3 3 3  '$ Attention/ Calculation 0 0 0  Recall '3 3 3  '$ Language- name 2 objects 0 0 0  Language- repeat 1  1 1  Language- follow 3 step command '3 3 3  '$ Language- read & follow direction 0 0 0  Write a sentence 0 0 0  Copy design 0 0 0  Total score '20 20 20        '$ 03/11/2022    3:29 PM  6CIT Screen  What Year? 0 points  What month? 0 points  What time? 0 points  Count back from 20 0 points  Months in reverse 4 points  Repeat phrase 8 points  Total Score 12 points    Immunizations Immunization History  Administered Date(s) Administered   PFIZER(Purple Top)SARS-COV-2 Vaccination 11/18/2019, 12/12/2019   Pneumococcal Conjugate-13 08/27/2014   Pneumococcal Polysaccharide-23 05/31/2016   Zoster Recombinat (Shingrix) 03/23/2021    TDAP status: Due, Education has been provided regarding the importance of this vaccine. Advised may receive this vaccine at local pharmacy or Health Dept. Aware to provide a copy of the vaccination record if obtained from local pharmacy or Health Dept. Verbalized acceptance and understanding.  Flu Vaccine status: Declined, Education has been provided regarding the importance of this vaccine but patient still  declined. Advised may receive this vaccine at local pharmacy or Health Dept. Aware to provide a copy of the vaccination record if obtained from local pharmacy or Health Dept. Verbalized acceptance and understanding.  Pneumococcal vaccine status: Up to date  Covid-19 vaccine status: Completed vaccines  Qualifies for Shingles Vaccine? Yes   Zostavax completed No   Shingrix Completed?: needs second dose  Screening Tests Health Maintenance  Topic Date Due   TETANUS/TDAP  Never done   OPHTHALMOLOGY EXAM  11/19/2017   COVID-19 Vaccine (3 - Pfizer risk series) 01/09/2020   FOOT EXAM  06/24/2020   Zoster Vaccines- Shingrix (2 of 2) 05/18/2021   HEMOGLOBIN A1C  08/28/2021   INFLUENZA VACCINE  05/28/2029 (Originally 05/11/2022)   MAMMOGRAM  07/31/2022   COLONOSCOPY (Pts 45-47yr Insurance coverage will need to be confirmed)  04/17/2024   Pneumonia Vaccine 76 Years old  Completed   DEXA SCAN  Completed   Hepatitis C Screening  Completed   HPV VACCINES  Aged Out    Health Maintenance  Health Maintenance Due  Topic Date Due   TETANUS/TDAP  Never done   OPHTHALMOLOGY EXAM  11/19/2017   COVID-19 Vaccine (3 - Pfizer risk series) 01/09/2020   FOOT EXAM  06/24/2020   Zoster Vaccines- Shingrix (2 of 2) 05/18/2021   HEMOGLOBIN A1C  08/28/2021    Colorectal cancer screening: Type of screening: Colonoscopy. Completed 04/17/2021. Repeat every 3 years  Mammogram status: Completed 07/31/2021. Repeat every year  Bone Density status: Completed 12/22/2021.   Lung Cancer Screening: (Low Dose CT Chest recommended if Age 76-80years, 30 pack-year currently smoking OR have quit w/in 15years.) does not qualify.   Lung Cancer Screening Referral: no  Additional Screening:  Hepatitis C Screening: does qualify; Completed 11/28/2013  Vision Screening: Recommended annual ophthalmology exams for early detection of glaucoma and other disorders of the eye. Is the patient up to date with their annual eye  exam?  No  Who is the provider or what is the name of the office in which the patient attends annual eye exams? [facility] If pt is not established with a provider, would they like to be referred to a provider to establish care? No .   Dental Screening: Recommended annual dental exams for proper oral hygiene  Community Resource Referral / Chronic Care Management: CRR required this visit?  No  CCM required this visit?  No      Plan:     I have personally reviewed and noted the following in the patient's chart:   Medical and social history Use of alcohol, tobacco or illicit drugs  Current medications and supplements including opioid prescriptions.  Functional ability and status Nutritional status Physical activity Advanced directives List of other physicians Hospitalizations, surgeries, and ER visits in previous 12 months Vitals Screenings to include cognitive, depression, and falls Referrals and appointments  In addition, I have reviewed and discussed with patient certain preventive protocols, quality metrics, and best practice recommendations. A written personalized care plan for preventive services as well as general preventive health recommendations were provided to patient.     Kellie Simmering, LPN   10/12/1973   Nurse Notes: none  Due to this being a virtual visit, the after visit summary with patients personalized plan was offered to patient via mail or my-chart. Patient would like to access on my-chart

## 2022-03-11 NOTE — Patient Instructions (Signed)
Beth Bailey , Thank you for taking time to come for your Medicare Wellness Visit. I appreciate your ongoing commitment to your health goals. Please review the following plan we discussed and let me know if I can assist you in the future.   Screening recommendations/referrals: Colonoscopy: completed 04/17/2021, due 04/17/2024 Mammogram: completed 07/31/2021, due 08/01/2022 Bone Density: completed 12/22/2021 Recommended yearly ophthalmology/optometry visit for glaucoma screening and checkup Recommended yearly dental visit for hygiene and checkup  Vaccinations: Influenza vaccine: decline Pneumococcal vaccine: completed 05/31/2016 Tdap vaccine: due Shingles vaccine: needs second dose   Covid-19: 12/12/2019, 11/18/2019  Advanced directives: Please bring a copy of your POA (Power of Attorney) and/or Living Will to your next appointment.   Conditions/risks identified: none  Next appointment: Follow up in one year for your annual wellness visit    Preventive Care 65 Years and Older, Female Preventive care refers to lifestyle choices and visits with your health care provider that can promote health and wellness. What does preventive care include? A yearly physical exam. This is also called an annual well check. Dental exams once or twice a year. Routine eye exams. Ask your health care provider how often you should have your eyes checked. Personal lifestyle choices, including: Daily care of your teeth and gums. Regular physical activity. Eating a healthy diet. Avoiding tobacco and drug use. Limiting alcohol use. Practicing safe sex. Taking low-dose aspirin every day. Taking vitamin and mineral supplements as recommended by your health care provider. What happens during an annual well check? The services and screenings done by your health care provider during your annual well check will depend on your age, overall health, lifestyle risk factors, and family history of disease. Counseling  Your  health care provider may ask you questions about your: Alcohol use. Tobacco use. Drug use. Emotional well-being. Home and relationship well-being. Sexual activity. Eating habits. History of falls. Memory and ability to understand (cognition). Work and work Statistician. Reproductive health. Screening  You may have the following tests or measurements: Height, weight, and BMI. Blood pressure. Lipid and cholesterol levels. These may be checked every 5 years, or more frequently if you are over 54 years old. Skin check. Lung cancer screening. You may have this screening every year starting at age 68 if you have a 30-pack-year history of smoking and currently smoke or have quit within the past 15 years. Fecal occult blood test (FOBT) of the stool. You may have this test every year starting at age 83. Flexible sigmoidoscopy or colonoscopy. You may have a sigmoidoscopy every 5 years or a colonoscopy every 10 years starting at age 59. Hepatitis C blood test. Hepatitis B blood test. Sexually transmitted disease (STD) testing. Diabetes screening. This is done by checking your blood sugar (glucose) after you have not eaten for a while (fasting). You may have this done every 1-3 years. Bone density scan. This is done to screen for osteoporosis. You may have this done starting at age 47. Mammogram. This may be done every 1-2 years. Talk to your health care provider about how often you should have regular mammograms. Talk with your health care provider about your test results, treatment options, and if necessary, the need for more tests. Vaccines  Your health care provider may recommend certain vaccines, such as: Influenza vaccine. This is recommended every year. Tetanus, diphtheria, and acellular pertussis (Tdap, Td) vaccine. You may need a Td booster every 10 years. Zoster vaccine. You may need this after age 45. Pneumococcal 13-valent conjugate (PCV13) vaccine. One  dose is recommended after age  21. Pneumococcal polysaccharide (PPSV23) vaccine. One dose is recommended after age 20. Talk to your health care provider about which screenings and vaccines you need and how often you need them. This information is not intended to replace advice given to you by your health care provider. Make sure you discuss any questions you have with your health care provider. Document Released: 10/24/2015 Document Revised: 06/16/2016 Document Reviewed: 07/29/2015 Elsevier Interactive Patient Education  2017 [place] Prevention in the Home Falls can cause injuries. They can happen to people of all ages. There are many things you can do to make your home safe and to help prevent falls. What can I do on the outside of my home? Regularly fix the edges of walkways and driveways and fix any cracks. Remove anything that might make you trip as you walk through a door, such as a raised step or threshold. Trim any bushes or trees on the path to your home. Use bright outdoor lighting. Clear any walking paths of anything that might make someone trip, such as rocks or tools. Regularly check to see if handrails are loose or broken. Make sure that both sides of any steps have handrails. Any raised decks and porches should have guardrails on the edges. Have any leaves, snow, or ice cleared regularly. Use sand or salt on walking paths during winter. Clean up any spills in your garage right away. This includes oil or grease spills. What can I do in the bathroom? Use night lights. Install grab bars by the toilet and in the tub and shower. Do not use towel bars as grab bars. Use non-skid mats or decals in the tub or shower. If you need to sit down in the shower, use a plastic, non-slip stool. Keep the floor dry. Clean up any water that spills on the floor as soon as it happens. Remove soap buildup in the tub or shower regularly. Attach bath mats securely with double-sided non-slip rug tape. Do not have throw  rugs and other things on the floor that can make you trip. What can I do in the bedroom? Use night lights. Make sure that you have a light by your bed that is easy to reach. Do not use any sheets or blankets that are too big for your bed. They should not hang down onto the floor. Have a firm chair that has side arms. You can use this for support while you get dressed. Do not have throw rugs and other things on the floor that can make you trip. What can I do in the kitchen? Clean up any spills right away. Avoid walking on wet floors. Keep items that you use a lot in easy-to-reach places. If you need to reach something above you, use a strong step stool that has a grab bar. Keep electrical cords out of the way. Do not use floor polish or wax that makes floors slippery. If you must use wax, use non-skid floor wax. Do not have throw rugs and other things on the floor that can make you trip. What can I do with my stairs? Do not leave any items on the stairs. Make sure that there are handrails on both sides of the stairs and use them. Fix handrails that are broken or loose. Make sure that handrails are as long as the stairways. Check any carpeting to make sure that it is firmly attached to the stairs. Fix any carpet that is loose or worn.  Avoid having throw rugs at the top or bottom of the stairs. If you do have throw rugs, attach them to the floor with carpet tape. Make sure that you have a light switch at the top of the stairs and the bottom of the stairs. If you do not have them, ask someone to add them for you. What else can I do to help prevent falls? Wear shoes that: Do not have high heels. Have rubber bottoms. Are comfortable and fit you well. Are closed at the toe. Do not wear sandals. If you use a stepladder: Make sure that it is fully opened. Do not climb a closed stepladder. Make sure that both sides of the stepladder are locked into place. Ask someone to hold it for you, if  possible. Clearly mark and make sure that you can see: Any grab bars or handrails. First and last steps. Where the edge of each step is. Use tools that help you move around (mobility aids) if they are needed. These include: Canes. Walkers. Scooters. Crutches. Turn on the lights when you go into a dark area. Replace any light bulbs as soon as they burn out. Set up your furniture so you have a clear path. Avoid moving your furniture around. If any of your floors are uneven, fix them. If there are any pets around you, be aware of where they are. Review your medicines with your doctor. Some medicines can make you feel dizzy. This can increase your chance of falling. Ask your doctor what other things that you can do to help prevent falls. This information is not intended to replace advice given to you by your health care provider. Make sure you discuss any questions you have with your health care provider. Document Released: 07/24/2009 Document Revised: 03/04/2016 Document Reviewed: 11/01/2014 Elsevier Interactive Patient Education  2017 [employer].

## 2022-03-11 NOTE — Chronic Care Management (AMB) (Signed)
Transition CCM to Self Care  Patient contacted to inform they have achieved their CCM goals and no longer need to be contacted as frequently. Patient advised services will still be available to them if they would like to reach out or have any new health concerns. Verified patient had contact information to pharmacist and health concierge on hand. Patient made aware CCM services would be continued if desired. Patient consented to cancel future CCM appointments.   [name], CPP notified  Beth Bailey, [place]  [phone]

## 2022-03-15 ENCOUNTER — Telehealth: Payer: Medicare HMO

## 2022-03-15 NOTE — Progress Notes (Deleted)
Chronic Care Management Pharmacy Note  03/15/2022 Name:  Beth Bailey MRN:  [id number] DOB:  June 02, 1946  Summary: ***  Recommendations/Changes made from today's visit: ***  Plan: ***   Subjective: Beth Bailey is an 76 y.o. year old female who is a primary patient of Ria Bush, MD.  The CCM team was consulted for assistance with disease management and care coordination needs.    {CCMTELEPHONEFACETOFACE:21091510} for {CCMINITIALFOLLOWUPCHOICE:21091511} in response to provider referral for pharmacy case management and/or care coordination services.   Consent to Services:  {CCMCONSENTOPTIONS:25074}  Patient Care Team: Ria Bush, MD as PCP - General (Family Medicine) Debbora Dus, [facility] as Pharmacist (Pharmacist)  Recent office visits: ***  Recent consult visits: [facility] visits: {Hospital DC Yes/No:25215}   Objective:  Lab Results  Component Value Date   CREATININE 0.74 05/11/2021   BUN 12 05/11/2021   GFR 79.14 05/11/2021   GFRNONAA >60 01/05/2020   GFRAA >60 01/05/2020   NA 141 05/11/2021   K 4.7 05/11/2021   CALCIUM 9.7 05/11/2021   CO2 30 05/11/2021   GLUCOSE 115 (H) 05/11/2021    Lab Results  Component Value Date/Time   HGBA1C 6.7 (H) 02/25/2021 10:03 AM   HGBA1C 6.5 12/26/2019 01:36 PM   GFR 79.14 05/11/2021 12:46 PM   GFR 85.01 02/25/2021 10:03 AM   MICROALBUR 1.2 12/26/2019 01:36 PM   MICROALBUR 3.1 (H) 12/08/2018 11:51 AM    Last diabetic Eye exam:  Lab Results  Component Value Date/Time   HMDIABEYEEXA No Retinopathy 11/19/2016 12:00 AM    Last diabetic Foot exam: No results found for: HMDIABFOOTEX   Lab Results  Component Value Date   CHOL 162 02/25/2021   HDL 49.20 02/25/2021   LDLCALC 87 02/25/2021   LDLDIRECT 202.0 12/26/2019   TRIG 128.0 02/25/2021   CHOLHDL 3 02/25/2021       Latest Ref Rng & Units 05/11/2021   12:46 PM 02/25/2021   10:03 AM 05/02/2020    9:48 AM  Hepatic Function   Total Protein 6.0 - 8.3 g/dL 7.1   6.7   6.7    Albumin 3.5 - 5.2 g/dL 4.5   4.4   4.5    AST 0 - 37 U/L '28   23   24    ' ALT 0 - 35 U/L '22   16   18    ' Alk Phosphatase 39 - 117 U/L 57   55   64    Total Bilirubin 0.2 - 1.2 mg/dL 0.5   0.6   0.6    Bilirubin, Direct 0.0 - 0.3 mg/dL 0.1    0.1      Lab Results  Component Value Date/Time   TSH 0.87 02/13/2020 01:18 PM   TSH 3.49 11/26/2014 10:10 AM   FREET4 1.16 02/13/2020 01:18 PM   FREET4 0.68 11/26/2014 10:10 AM       Latest Ref Rng & Units 05/11/2021   12:46 PM 01/05/2020   12:00 AM 01/10/2019   12:44 PM  CBC  WBC 4.0 - 10.5 K/uL 5.4   11.5   7.7    Hemoglobin 12.0 - 15.0 g/dL 13.0   13.0   13.9    Hematocrit 36.0 - 46.0 % 39.0   38.8   41.1    Platelets 150.0 - 400.0 K/uL 165.0   181   163.0      Lab Results  Component Value Date/Time   VD25OH 33.70 11/26/2014 10:10 AM    Clinical ASCVD: {  YES/NO:21197} The 10-year ASCVD risk score (Arnett DK, et al., 2019) is: 55.1%   Values used to calculate the score:     Age: 85 years     Sex: Female     Is Non-Hispanic African American: No     Diabetic: Yes     Tobacco smoker: No     Systolic Blood Pressure: 259 mmHg     Is BP treated: Yes     HDL Cholesterol: 49.2 mg/dL     Total Cholesterol: 162 mg/dL       03/11/2022    3:27 PM 03/04/2021   11:20 AM 12/26/2019    1:26 PM  Depression screen PHQ 2/9  Decreased Interest 0 2 0  Down, Depressed, Hopeless 0 0 0  PHQ - 2 Score 0 2 0  Altered sleeping  0 0  Tired, decreased energy  0 0  Change in appetite  2 1  Feeling bad or failure about yourself   0 0  Trouble concentrating  1 1  Moving slowly or fidgety/restless  0 0  Suicidal thoughts  0 0  PHQ-9 Score  5 2     ***Other: (CHADS2VASc if Afib, MMRC or CAT for COPD, ACT, DEXA)  Social History   Tobacco Use  Smoking Status Never  Smokeless Tobacco Never   BP Readings from Last 3 Encounters:  02/09/22 (!) 150/64  06/18/21 (!) 150/80  05/11/21 140/90   Pulse  Readings from Last 3 Encounters:  02/09/22 73  06/18/21 74  05/11/21 76   Wt Readings from Last 3 Encounters:  03/11/22 115 lb (52.2 kg)  02/09/22 126 lb 8 oz (57.4 kg)  06/18/21 125 lb 8 oz (56.9 kg)   BMI Readings from Last 3 Encounters:  03/11/22 23.23 kg/m  02/09/22 26.67 kg/m  06/18/21 26.46 kg/m    Assessment/Interventions: Review of patient past medical history, allergies, medications, health status, including review of consultants reports, laboratory and other test data, was performed as part of comprehensive evaluation and provision of chronic care management services.   SDOH:  (Social Determinants of Health) assessments and interventions performed: {yes/no:20286}  SDOH Screenings   Alcohol Screen: Not on file  Depression (PHQ2-9): Low Risk    PHQ-2 Score: 0  Financial Resource Strain: Low Risk    Difficulty of Paying Living Expenses: Not hard at all  Food Insecurity: No Food Insecurity   Worried About Charity fundraiser in the Last Year: Never true   Ran Out of Food in the Last Year: Never true  Housing: Not on file  Physical Activity: Inactive   Days of Exercise per Week: 0 days   Minutes of Exercise per Session: 0 min  Social Connections: Not on file  Stress: No Stress Concern Present   Feeling of Stress : Not at all  Tobacco Use: Low Risk    Smoking Tobacco Use: Never   Smokeless Tobacco Use: Never   Passive Exposure: Not on file  Transportation Needs: No Transportation Needs   Lack of Transportation (Medical): No   Lack of Transportation (Non-Medical): No    CCM Care Plan  Allergies  Allergen Reactions   Amoxicillin Other (See Comments)    Pt does not remember what the reaction was   Atorvastatin Other (See Comments)    myalgia   Diclofenac Sodium Swelling   Erythromycin Swelling   Penicillins Hives    Has patient had a PCN reaction causing immediate rash, facial/tongue/throat swelling, SOB or lightheadedness with hypotension: Yes Has  patient had a PCN reaction causing severe rash involving mucus membranes or skin necrosis: No Has patient had a PCN reaction that required hospitalization No Has patient had a PCN reaction occurring within the last 10 years: No If all of the above answers are "NO", then may proceed with Cephalosporin use. Other reaction(s): Unknown, Unknown Unknown, rash possibly    Pravastatin Other (See Comments)    myalgia   Simvastatin Other (See Comments)    muscle pain   Statins Other (See Comments)    Myalgias to simvastatin, pravastatin, lovastatin, atorvastatin   Trifluridine Swelling   Latex Rash   Lisinopril Rash and Other (See Comments)    Hyperkalemia and achy bones also    Medications Reviewed Today     Reviewed by Kellie Simmering, LPN (Licensed Practical Nurse) on 03/11/22 at [place] List Status: <None>   Medication Order Taking? Sig Documenting Provider Last Dose Status Informant  atorvastatin (LIPITOR) 40 MG tablet [id number] Yes TAKE 1 TABLET BY MOUTH DAILY AT 6 PM. Ria Bush, MD Taking Active   Biotin 5000 MCG CAPS [id number] Yes Take 5,000 mcg by mouth 2 (two) times a week. As needed [provider] Taking Active Self           Med Note Lequita Asal, [facility] A   Wed Oct 29, 2015 11:16 AM)    cetirizine (ZYRTEC) 10 MG tablet [id number] Yes Take 10 mg by mouth at bedtime.  [provider] Taking Active Self  Cholecalciferol (VITAMIN D3) 25 MCG (1000 UT) CAPS [id number] Yes Take 1 capsule (1,000 Units total) by mouth daily. Ria Bush, MD Taking Active   gabapentin (NEURONTIN) 300 MG capsule [id number] Yes Take 1 capsule (300 mg total) by mouth 2 (two) times daily. Ria Bush, MD Taking Active   HYDROcodone-acetaminophen (NORCO/VICODIN) 5-325 MG tablet [id number] Yes TAKE 1 TABLET BY MOUTH EVERY 6 HOURS AS NEEDED FOR PAIN. Ria Bush, MD Taking Active Self  losartan (COZAAR) 50 MG tablet [id number] Yes Take 1 tablet (50 mg total) by mouth daily.  Ria Bush, MD Taking Active   loteprednol (LOTEMAX) 0.5 % ophthalmic suspension [id number] Yes 1 drop. At bedtime [provider] Taking Active Self  METAMUCIL FIBER PO [id number] Yes Take by mouth as needed. [provider] Taking Active Self  MISC NATURAL PRODUCTS PO [id number] Yes Take by mouth as needed. Fast Acting Dairy Digestive supplement [provider] Taking Active Self  Omega-3 Fatty Acids (FISH OIL PO) [id number] Yes Take 1 capsule by mouth daily. OmegaXL - 2 capsule daily [provider] Taking Active   omeprazole (PRILOSEC) 20 MG capsule [id number] Yes Take 1 capsule (20 mg total) by mouth daily. Ria Bush, MD Taking Active   Polyethyl Glycol-Propyl Glycol 0.4-0.3 % SOLN [id number] Yes Place 1 drop into both eyes 3 (three) times daily as needed (for dry eyes). [provider] Taking Active   sertraline (ZOLOFT) 100 MG tablet [id number] Yes Take 1 tablet (100 mg total) by mouth daily. Ria Bush, MD Taking Active   valACYclovir (VALTREX) 1000 MG tablet [id number] Yes Take 1 tablet (1,000 mg total) by mouth at bedtime. Ria Bush, MD Taking Active   vitamin B-12 (CYANOCOBALAMIN) 1000 MCG tablet [id number] Yes Take 1,000 mcg by mouth 3 (three) times a week.  [provider] Taking Active Self           Med Note Lequita Asal, [facility] A   Wed Oct 29, 2015 11:16 AM)  Patient Active Problem List   Diagnosis Date Noted   Neck pain on right side 02/09/2022   Calcium pyrophosphate deposition disease (CPPD) 07/14/2021   Statin intolerance 01/12/2020   Pulmonary nodule, right 04/13/2019   History of malignant melanoma of skin 03/29/2018   Generalized abdominal pain 03/14/2018   Carotid stenosis 06/19/2016   Health maintenance examination 05/31/2016   Advanced care planning/counseling discussion 12/02/2014   Lesion of spleen    Caregiver burden 12/18/2013   NAFLD (nonalcoholic fatty liver disease)  09/30/2013   Medicare annual wellness visit, subsequent 09/25/2013   Osteopenia    IBS (irritable bowel syndrome) 08/29/2013   Internal hemorrhoids with Grade 2 prolapse, itching, bleeding and soiling 08/29/2013   Familial hyperlipidemia, high LDL    History of colon polyps    Herpes simplex of eye    HNP (herniated nucleus pulposus), lumbar 12/07/2012   Controlled diabetes mellitus type 2 with complications ([place]) 81/10/7508   Essential hypertension 02/23/2007   GERD 02/23/2007   Osteoarthritis 02/23/2007    Immunization History  Administered Date(s) Administered   PFIZER(Purple Top)SARS-COV-2 Vaccination 11/18/2019, 12/12/2019   Pneumococcal Conjugate-13 08/27/2014   Pneumococcal Polysaccharide-23 05/31/2016   Zoster Recombinat (Shingrix) 03/23/2021    Conditions to be addressed/monitored:  {USCCMDZASSESSMENTOPTIONS:23563}  There are no care plans that you recently modified to display for this patient.    Medication Assistance: {MEDASSISTANCEINFO:25044}  Compliance/Adherence/Medication fill history: Care Gaps: ***  Star-Rating Drugs: ***  Patient's preferred pharmacy is:  [id number] [id number]: [phone]: [phone] Uses pill box? {Yes or If no, why not?:20788} Pt endorses ***% compliance  We discussed: {Pharmacy options:24294} Patient decided to: {US Pharmacy Plan:23885}  Care Plan and Follow Up Patient Decision:  {FOLLOWUP:24991}  Plan: {CM FOLLOW UP PPJKD:32671} ***     Current Barriers:  None identified  Pharmacist Clinical Goal(s):  Patient will contact provider office for questions/concerns as evidenced notation of same in electronic health record through collaboration with PharmD and provider.   Interventions: 1:1 collaboration with GRia Bush MD regarding development and update of comprehensive plan of care as evidenced by provider attestation  and co-signature Inter-disciplinary care team collaboration (see longitudinal plan of care) Comprehensive medication review performed; medication list updated in electronic medical record  Hypertension (BP goal <140/90) -Controlled- clinic readings better since losartan increased -Current treatment: Losartan 50 mg daily - Appropriate, Effective, Safe, Accessible -Medications previously tried: none reported  -Current home readings: niece checked this week - 112/61 -Current exercise habits: she tries to walk around yard, to [employer] gardening  -Denies hypotensive/hypertensive symptoms -Denies any falls recently. Reports in the past she fell often. She was not eating well, and taking her medicines as she should and felt dizzy. -Educated on BP goals and benefits of medications for prevention of heart attack, stroke and kidney damage; -Counseled to monitor BP at home once monthly or with symptoms, document, and provide log at future appointments -Recommended to continue current medication  Hyperlipidemia: (LDL goal < 100) -Controlled - LDL 87 -Current treatment: Atorvastatin 40 mg - 1 tablet daily (evening) Omega XL - takes 2 capsule once daily  -Medications previously tried: none  -Educated on Cholesterol goals;  -Recommended to continue current medication  Depression/Anxiety (Goal: Improve mood) -Controlled - per patient, this works well -Current treatment: Sertraline 100 mg - 1 tablet daily (morning) -Medications previously tried/failed: none -PHQ9: 5 -She states she has a lot of good friends that  help. Enjoys gardening. -Educated on Benefits of medication for symptom control -Recommended to continue current medication  GERD (Goal: Control symptoms) -Controlled - per patient report -Current treatment  Omeprazole 20 mg - 1 capsule daily  -Medications previously tried: none reported  -Recommended to continue current medication  Osteoarthritis (Goal: Improve  pain) -Controlled - per patient report -Followed by orthopedics, [facility] (last visit 01/05/21) -Current treatment  Hydrocodone-acetaminophen 5-325 mg - 1 tablet every 6 hours PRN Gabapentin 300 mg - 1 capsule twice daily (BF and bedtime) -Non-pharm: Wears back brace, uses heating pads -Medications previously tried: none reported, avoids NSAIDs -Most days takes 1 Norco in the morning, may take Tylenol PRN later in the day -Recommended to continue current medication  OTC/Supplements: Fast Acting Diary Digestive supplement - PRN Vitamin B12 1000 mcg - three days per week Vitamin D3 1000 IU - 1 every day  Biotin 5000 mcg - 1 twice weekly Zyrtec 10 mg - 1 every other night Metamucil -  1 every day   Patient Goals/Self-Care Activities Patient will:  - {pharmacypatientgoals:24919}

## 2022-04-15 ENCOUNTER — Other Ambulatory Visit: Payer: Self-pay | Admitting: Family Medicine

## 2022-04-22 DIAGNOSIS — Z947 Corneal transplant status: Secondary | ICD-10-CM | POA: Diagnosis not present

## 2022-04-22 DIAGNOSIS — H2512 Age-related nuclear cataract, left eye: Secondary | ICD-10-CM | POA: Diagnosis not present

## 2022-04-22 DIAGNOSIS — H04123 Dry eye syndrome of bilateral lacrimal glands: Secondary | ICD-10-CM | POA: Diagnosis not present

## 2022-04-22 DIAGNOSIS — Z961 Presence of intraocular lens: Secondary | ICD-10-CM | POA: Diagnosis not present

## 2022-04-22 DIAGNOSIS — D492 Neoplasm of unspecified behavior of bone, soft tissue, and skin: Secondary | ICD-10-CM | POA: Diagnosis not present

## 2022-04-27 DIAGNOSIS — H524 Presbyopia: Secondary | ICD-10-CM | POA: Diagnosis not present

## 2022-05-02 ENCOUNTER — Other Ambulatory Visit: Payer: Self-pay | Admitting: Family Medicine

## 2022-05-02 DIAGNOSIS — E7849 Other hyperlipidemia: Secondary | ICD-10-CM

## 2022-05-02 DIAGNOSIS — E118 Type 2 diabetes mellitus with unspecified complications: Secondary | ICD-10-CM

## 2022-05-02 DIAGNOSIS — M85852 Other specified disorders of bone density and structure, left thigh: Secondary | ICD-10-CM

## 2022-05-05 ENCOUNTER — Other Ambulatory Visit (INDEPENDENT_AMBULATORY_CARE_PROVIDER_SITE_OTHER): Payer: Medicare HMO

## 2022-05-05 DIAGNOSIS — E118 Type 2 diabetes mellitus with unspecified complications: Secondary | ICD-10-CM | POA: Diagnosis not present

## 2022-05-05 DIAGNOSIS — E7849 Other hyperlipidemia: Secondary | ICD-10-CM

## 2022-05-05 DIAGNOSIS — M85852 Other specified disorders of bone density and structure, left thigh: Secondary | ICD-10-CM | POA: Diagnosis not present

## 2022-05-05 LAB — COMPREHENSIVE METABOLIC PANEL
ALT: 21 U/L (ref 0–35)
AST: 26 U/L (ref 0–37)
Albumin: 4.6 g/dL (ref 3.5–5.2)
Alkaline Phosphatase: 68 U/L (ref 39–117)
BUN: 10 mg/dL (ref 6–23)
CO2: 29 mEq/L (ref 19–32)
Calcium: 9.5 mg/dL (ref 8.4–10.5)
Chloride: 104 mEq/L (ref 96–112)
Creatinine, Ser: 0.77 mg/dL (ref 0.40–1.20)
GFR: 74.93 mL/min (ref >60.00)
Glucose, Bld: 118 mg/dL — ABNORMAL HIGH (ref 70–99)
Potassium: 4.2 mEq/L (ref 3.5–5.1)
Sodium: 141 mEq/L (ref 135–145)
Total Bilirubin: 0.4 mg/dL (ref 0.2–1.2)
Total Protein: 7.2 g/dL (ref 6.0–8.3)

## 2022-05-05 LAB — LIPID PANEL
Cholesterol: 190 mg/dL (ref 0–200)
HDL: 55.9 mg/dL (ref >39.00)
LDL Cholesterol: 107 mg/dL — ABNORMAL HIGH (ref 0–99)
NonHDL: 133.74
Total CHOL/HDL Ratio: 3
Triglycerides: 133 mg/dL (ref 0.0–149.0)
VLDL: 26.6 mg/dL (ref 0.0–40.0)

## 2022-05-05 LAB — MICROALBUMIN / CREATININE URINE RATIO
Creatinine,U: 86.6 mg/dL
Microalb Creat Ratio: 1.2 mg/g (ref 0.0–30.0)
Microalb, Ur: 1 mg/dL (ref 0.0–1.9)

## 2022-05-05 LAB — VITAMIN D 25 HYDROXY (VIT D DEFICIENCY, FRACTURES): VITD: 50.51 ng/mL (ref 30.00–100.00)

## 2022-05-05 LAB — HEMOGLOBIN A1C: Hgb A1c MFr Bld: 7.1 % — ABNORMAL HIGH (ref 4.6–6.5)

## 2022-05-12 ENCOUNTER — Ambulatory Visit (INDEPENDENT_AMBULATORY_CARE_PROVIDER_SITE_OTHER): Payer: Medicare HMO | Admitting: Family Medicine

## 2022-05-12 ENCOUNTER — Ambulatory Visit
Admission: RE | Admit: 2022-05-12 | Discharge: 2022-05-12 | Disposition: A | Discharge status: Home / Self Care | Payer: Medicare HMO | Source: Ambulatory Visit | Attending: Family Medicine | Admitting: Family Medicine

## 2022-05-12 ENCOUNTER — Encounter: Payer: Self-pay | Admitting: Family Medicine

## 2022-05-12 VITALS — BP 134/70 | HR 83 | Temp 98.1°F | Ht <= 58 in | Wt 124.2 lb

## 2022-05-12 DIAGNOSIS — R911 Solitary pulmonary nodule: Secondary | ICD-10-CM

## 2022-05-12 DIAGNOSIS — K219 Gastro-esophageal reflux disease without esophagitis: Secondary | ICD-10-CM | POA: Diagnosis not present

## 2022-05-12 DIAGNOSIS — E118 Type 2 diabetes mellitus with unspecified complications: Secondary | ICD-10-CM

## 2022-05-12 DIAGNOSIS — M85852 Other specified disorders of bone density and structure, left thigh: Secondary | ICD-10-CM | POA: Diagnosis not present

## 2022-05-12 DIAGNOSIS — M79672 Pain in left foot: Secondary | ICD-10-CM

## 2022-05-12 DIAGNOSIS — Z86010 Personal history of colonic polyps: Secondary | ICD-10-CM | POA: Diagnosis not present

## 2022-05-12 DIAGNOSIS — Z0000 Encounter for general adult medical examination without abnormal findings: Secondary | ICD-10-CM | POA: Diagnosis not present

## 2022-05-12 DIAGNOSIS — B0050 Herpesviral ocular disease, unspecified: Secondary | ICD-10-CM | POA: Diagnosis not present

## 2022-05-12 DIAGNOSIS — Z0001 Encounter for general adult medical examination with abnormal findings: Secondary | ICD-10-CM

## 2022-05-12 DIAGNOSIS — I10 Essential (primary) hypertension: Secondary | ICD-10-CM | POA: Diagnosis not present

## 2022-05-12 DIAGNOSIS — E7849 Other hyperlipidemia: Secondary | ICD-10-CM

## 2022-05-12 DIAGNOSIS — G8929 Other chronic pain: Secondary | ICD-10-CM

## 2022-05-12 DIAGNOSIS — G3184 Mild cognitive impairment, so stated: Secondary | ICD-10-CM

## 2022-05-12 DIAGNOSIS — Z7189 Other specified counseling: Secondary | ICD-10-CM | POA: Diagnosis not present

## 2022-05-12 DIAGNOSIS — Z85820 Personal history of malignant melanoma of skin: Secondary | ICD-10-CM

## 2022-05-12 DIAGNOSIS — I6523 Occlusion and stenosis of bilateral carotid arteries: Secondary | ICD-10-CM

## 2022-05-12 DIAGNOSIS — R4589 Other symptoms and signs involving emotional state: Secondary | ICD-10-CM

## 2022-05-12 DIAGNOSIS — M545 Low back pain, unspecified: Secondary | ICD-10-CM

## 2022-05-12 DIAGNOSIS — K760 Fatty (change of) liver, not elsewhere classified: Secondary | ICD-10-CM

## 2022-05-12 MED ORDER — DONEPEZIL HCL 5 MG PO TABS: 5.0000 mg | ORAL_TABLET | Freq: Every day | ORAL | 6 refills | Status: DC

## 2022-05-12 NOTE — Assessment & Plan Note (Signed)
Advanced directives: has this set up. Daughter Kearney Hard) is HCPOA. Asked to bring [place] copy.

## 2022-05-12 NOTE — Assessment & Plan Note (Signed)
Preventative protocols reviewed and updated unless pt declined. Discussed healthy diet and lifestyle.  

## 2022-05-12 NOTE — Progress Notes (Unsigned)
Patient ID: Beth Bailey, female    DOB: 1946-08-04, 76 y.o.   MRN: [id number]  This visit was conducted in person.  BP 134/70   Pulse 83   Temp 98.1 F (36.7 C) (Oral)   Ht '4\' 10"'$  (1.473 m)   Wt 124 lb 4 oz (56.4 kg)   SpO2 94%   BMI 25.97 kg/m    CC: CPE Subjective:   HPI: Beth Bailey is a 76 y.o. female presenting on 05/12/2022 for Annual Exam [facility] prt 2. Pt accompanied by daughter, Sharyn Lull. )   Saw health advisor 03/2022 for medicare wellness visit. Note reviewed.  Failed 6CIT score (12 points).   Today: Mental Status Exam: 22/30 (value/max value)   Clock Drawing Score:1/4  No results found.  Flowsheet Row Clinical Support from 03/11/2022 in [facility] [place]  PHQ-2 Total Score 0          03/11/2022    3:26 PM 03/04/2021   10:18 AM 12/26/2019   12:34 PM 12/08/2018   12:04 PM 11/30/2017   12:30 PM  Fall Risk   Falls in the past year? 1  1 0 No  Comment tripped over things  [address] while assisting husband    Number falls in past yr: 1 1 0    Injury with Fall? 0 0 0    Risk for fall due to : Medication side effect      Follow up Falls evaluation completed;Education provided;Falls prevention discussed      Several falls in the past 12 months. All mechanical, no premonitory symptoms. Had a fall 06/2021   Notes more difficulty with memory. Daughter will start taking over finances and medication management. No tremor. No mood changes, desires to continue sertraline '100mg'$  daily.   New kitten at home.    Husband passed away 01/09/2020. Mother passed away 17-Dec-2020 after her 98th birthday.   Sees Dr Sharlet Salina PM&R for chronic back pain. She is on gabapentin '300mg'$  bid, norco 5/'325mg'$  BID PRN, flexeril '5mg'$  PRN.   She also takes valtrex '1000mg'$  nightly preventatively after R corneal transplant surgery at [facility] Jan 15, 2011). Planning to re establish with Dr Katy Fitch or [facility].    Preventative: COLONOSCOPY 10/2010 - polyp, mild divertic, int hem, rec rpt 5  yrs given fmhx Carlean Purl)  COLONOSCOPY 04/2021 - 3 TAs, diverticulosis, rpt 3 yrs Carlean Purl) Last well woman with prior PCP 01-15-12, s/p hysterectomy 01/15/84, ovaries remain. Denies pelvic pain or pressure. No vaginal/vulvar skin changes.  [facility]  Dexa - 2015/01/15 osteopenia T -1.5 at hip.  DEXA Jan 14, 2022 - T -1.8 LFN, elevated fracture risk (any 23%, hip 13%) - consider treatment, rpt 2 yrs.  Lung cancer screening - not eligible  Flu - declines  COVID vaccine Pfizer 12-18-2019, 15-Jan-2020, no booster  Prevnar-13 08/2014. Pneumovax 05/2016  Pfizer covid vaccine series completed Jan 15, 2020  Tetanus - unsure Shingrix - 03/2021, due for 2nd Advanced directives: has this set up. Daughter Kearney Hard) is HCPOA. Asked to bring [place] copy. Seat belt use discussed.  Sunscreen use discussed. No changing moles on skin. Sees derm regularly in h/o MM.  Non smoker  Alcohol - none  Dentist - overdue - has upper dentures  Eye exam yearly - h/o R corneal transplant  Bowel - no constipation  Bladder - no incontinence - better with kegel exercises   Widow - husband with dementia passed away 01-15-20 One daughter - neighbor Occupation: retired, worked at [employer]  Activity: no regular exercise, but walking regularly - has membership to [employer] Diet: fruits/vegetables daily, good water     Relevant past medical, surgical, family and social history reviewed and updated as indicated. Interim medical history since our last visit reviewed. Allergies and medications reviewed and updated. Outpatient Medications Prior to Visit  Medication Sig Dispense Refill  . Biotin 5000 MCG CAPS Take 5,000 mcg by mouth 2 (two) times a week. As needed    . cetirizine (ZYRTEC) 10 MG tablet Take 10 mg by mouth at bedtime.     . Cholecalciferol (VITAMIN D3) 25 MCG (1000 UT) CAPS Take 1 capsule (1,000 Units total) by mouth daily. 30 capsule   . gabapentin (NEURONTIN) 300 MG capsule Take 1 capsule (300 mg  total) by mouth 2 (two) times daily. 180 capsule 3  . HYDROcodone-acetaminophen (NORCO/VICODIN) 5-325 MG tablet TAKE 1 TABLET BY MOUTH EVERY 6 HOURS AS NEEDED FOR PAIN. 30 tablet 0  . losartan (COZAAR) 50 MG tablet Take 1 tablet (50 mg total) by mouth daily. 90 tablet 3  . loteprednol (LOTEMAX) 0.5 % ophthalmic suspension 1 drop. At bedtime    . METAMUCIL FIBER PO Take by mouth as needed.    Marland Kitchen MISC NATURAL PRODUCTS PO Take by mouth as needed. Fast Acting Dairy Digestive supplement    . Omega-3 Fatty Acids (FISH OIL PO) Take 1 capsule by mouth daily. OmegaXL - 2 capsule daily    . Polyethyl Glycol-Propyl Glycol 0.4-0.3 % SOLN Place 1 drop into both eyes 3 (three) times daily as needed (for dry eyes).    . sertraline (ZOLOFT) 100 MG tablet Take 1 tablet (100 mg total) by mouth daily. 90 tablet 3  . valACYclovir (VALTREX) 1000 MG tablet Take 1 tablet (1,000 mg total) by mouth at bedtime. 90 tablet 3  . atorvastatin (LIPITOR) 40 MG tablet TAKE 1 TABLET BY MOUTH DAILY AT 6 PM. 90 tablet 0  . omeprazole (PRILOSEC) 20 MG capsule TAKE 1 CAPSULE BY MOUTH EVERY DAY 90 capsule 0  . vitamin B-12 (CYANOCOBALAMIN) 1000 MCG tablet Take 1,000 mcg by mouth 3 (three) times a week.      No facility-administered medications prior to visit.     Per HPI unless specifically indicated in ROS section below Review of Systems  Constitutional:  Negative for activity change, appetite change, chills, fatigue, fever and unexpected weight change.  HENT:  Negative for hearing loss.   Eyes:  Negative for visual disturbance.  Respiratory:  Negative for cough, chest tightness, shortness of breath and wheezing.   Cardiovascular:  Negative for chest pain, palpitations and leg swelling.  Gastrointestinal:  Positive for diarrhea. Negative for abdominal distention, abdominal pain, blood in stool, constipation, nausea and vomiting.  Genitourinary:  Negative for difficulty urinating and hematuria.  Musculoskeletal:  Negative for  arthralgias, myalgias and neck pain.  Skin:  Negative for rash.  Neurological:  Negative for dizziness, seizures, syncope and headaches.  Hematological:  Negative for adenopathy. Does not bruise/bleed easily.  Psychiatric/Behavioral:  Negative for dysphoric mood. The patient is not nervous/anxious.     Objective:  BP 134/70   Pulse 83   Temp 98.1 F (36.7 C) (Oral)   Ht '4\' 10"'$  (1.473 m)   Wt 124 lb 4 oz (56.4 kg)   SpO2 94%   BMI 25.97 kg/m   Wt Readings from Last 3 Encounters:  05/12/22 124 lb 4 oz (56.4 kg)  03/11/22 115 lb (52.2 kg)  02/09/22 126 lb 8 oz (57.4 kg)  Physical Exam Vitals and nursing note reviewed.  Constitutional:      Appearance: Normal appearance. She is not ill-appearing.  HENT:     Head: Normocephalic and atraumatic.     Right Ear: Tympanic membrane, ear canal and external ear normal. There is no impacted cerumen.     Left Ear: Tympanic membrane, ear canal and external ear normal. There is no impacted cerumen.  Eyes:     General:        Right eye: No discharge.        Left eye: No discharge.     Extraocular Movements: Extraocular movements intact.     Conjunctiva/sclera: Conjunctivae normal.     Pupils: Pupils are equal, round, and reactive to light.  Neck:     Thyroid: No thyroid mass or thyromegaly.     Vascular: No carotid bruit.  Cardiovascular:     Rate and Rhythm: Normal rate and regular rhythm.     Pulses: Normal pulses.     Heart sounds: Normal heart sounds. No murmur heard. Pulmonary:     Effort: Pulmonary effort is normal. No respiratory distress.     Breath sounds: Normal breath sounds. No wheezing, rhonchi or rales.  Abdominal:     General: Bowel sounds are normal. There is no distension.     Palpations: Abdomen is soft. There is no mass.     Tenderness: There is no abdominal tenderness. There is no guarding or rebound.     Hernia: No hernia is present.  Musculoskeletal:     Cervical back: Normal range of motion and neck  supple. No rigidity.     Right lower leg: No edema.     Left lower leg: No edema.  Lymphadenopathy:     Cervical: No cervical adenopathy.  Skin:    General: Skin is warm and dry.     Findings: No rash.  Neurological:     General: No focal deficit present.     Mental Status: She is alert. Mental status is at baseline.  Psychiatric:        Mood and Affect: Mood normal.        Behavior: Behavior normal.      Results for orders placed or performed in visit on 05/05/22  VITAMIN D 25 Hydroxy (Vit-D Deficiency, Fractures)  Result Value Ref Range   VITD 50.51 30.00 - 100.00 ng/mL  Microalbumin / creatinine urine ratio  Result Value Ref Range   Microalb, Ur 1.0 0.0 - 1.9 mg/dL   Creatinine,U 86.6 mg/dL   Microalb Creat Ratio 1.2 0.0 - 30.0 mg/g  Hemoglobin A1c  Result Value Ref Range   Hgb A1c MFr Bld 7.1 (H) 4.6 - 6.5 %  Lipid panel  Result Value Ref Range   Cholesterol 190 0 - 200 mg/dL   Triglycerides 133.0 0.0 - 149.0 mg/dL   HDL 55.90 >39.00 mg/dL   VLDL 26.6 0.0 - 40.0 mg/dL   LDL Cholesterol 107 (H) 0 - 99 mg/dL   Total CHOL/HDL Ratio 3    NonHDL 133.74   Comprehensive metabolic panel  Result Value Ref Range   Sodium 141 135 - 145 mEq/L   Potassium 4.2 3.5 - 5.1 mEq/L   Chloride 104 96 - 112 mEq/L   CO2 29 19 - 32 mEq/L   Glucose, Bld 118 (H) 70 - 99 mg/dL   BUN 10 6 - 23 mg/dL   Creatinine, Ser 0.77 0.40 - 1.20 mg/dL   Total Bilirubin 0.4 0.2 - 1.2 mg/dL  Alkaline Phosphatase 68 39 - 117 U/L   AST 26 0 - 37 U/L   ALT 21 0 - 35 U/L   Total Protein 7.2 6.0 - 8.3 g/dL   Albumin 4.6 3.5 - 5.2 g/dL   GFR 74.93 >60.00 mL/min   Calcium 9.5 8.4 - 10.5 mg/dL      03/11/2022    3:27 PM 03/04/2021   11:20 AM 12/26/2019    1:26 PM 12/08/2018   12:04 PM 11/30/2017   12:30 PM  Depression screen PHQ 2/9  Decreased Interest 0 2 0 0 0  Down, Depressed, Hopeless 0 0 0 0 0  PHQ - 2 Score 0 2 0 0 0  Altered sleeping  0 0 0 0  Tired, decreased energy  0 0 0 0  Change in  appetite  2 1 0 0  Feeling bad or failure about yourself   0 0 0 0  Trouble concentrating  1 1 0 0  Moving slowly or fidgety/restless  0 0 0 0  Suicidal thoughts  0 0 0 0  PHQ-9 Score  5 2 0 0  Difficult doing work/chores    Not difficult at all Not difficult at all      03/04/2021   11:20 AM 12/26/2019    1:26 PM  GAD 7 : Generalized Anxiety Score  Nervous, Anxious, on Edge 1 2  Control/stop worrying 0 0  Worry too much - different things 1 0  Trouble relaxing 0 1  Restless 1 0  Easily annoyed or irritable 0 2  Afraid - awful might happen 0 0  Total GAD 7 Score 3 5   Assessment & Plan:   Problem List Items Addressed This Visit     Advanced care planning/counseling discussion (Chronic)    Advanced directives: has this set up. Daughter Kearney Hard) is HCPOA. Asked to bring [place] copy.      Encounter for general adult medical examination with abnormal findings - Primary (Chronic)    Preventative protocols reviewed and updated unless pt declined. Discussed healthy diet and lifestyle.       Controlled diabetes mellitus type 2 with complications (HCC)    Historically diet controlled and largely well controlled however recent A1c was higher 7.1% - recommend renewed efforts to follow low sugar low carb diabetic diet. She does enjoy sweets, endorses frequent dietary liberties.       Relevant Medications   atorvastatin (LIPITOR) 40 MG tablet   Essential hypertension    Chronic, stable. Continue current regimen.       Relevant Medications   atorvastatin (LIPITOR) 40 MG tablet   GERD    Stable period on daily omeprazole '20mg'$ . Did suggest trying QOD dosing to see if reflux symptoms remain well controlled      Relevant Medications   omeprazole (PRILOSEC) 20 MG capsule   Familial hyperlipidemia, high LDL    Overall stable period on daily atorvastatin however LDL above goal in diabetic - continue statin, reviewed diet choices to improve LDL levels. The 10-year ASCVD risk score  (Arnett DK, et al., 2019) is: 43%   Values used to calculate the score:     Age: 14 years     Sex: Female     Is Non-Hispanic African American: No     Diabetic: Yes     Tobacco smoker: No     Systolic Blood Pressure: 403 mmHg     Is BP treated: Yes     HDL Cholesterol: 55.9 mg/dL  Total Cholesterol: 190 mg/dL       Relevant Medications   atorvastatin (LIPITOR) 40 MG tablet   History of colon polyps    Consider rpt colonoscopy 2025      Herpes simplex of eye    S/p corneal transplant continues preventative valtrex '1000mg'$  nightly.       Osteopenia    Discussed calcium and vitamin D intake. rec '1200mg'$  cal/day, ideally most in diet and if not reaching that then may add '600mg'$  calcium supplement.  Activity limited by chronic back pain.  Discussed she is at increased fracture risk based on FRAX score. Discussed option of osteoporosis bone rebuilding treatment, she desires to avoid for now. rec rpt DEXA 2 yrs.       NAFLD (nonalcoholic fatty liver disease)    LFTs normal.       Depressed mood    H/o caregiver burden when husband and mother were alive, both passed away in the past 2 years. Has been on sertraline '100mg'$  daily for prolonged time, does not desire to taper at this time. Notes ongoing difficulty with depressed mood despite low PHQ9 score       Carotid stenosis    Last [place] 2019, minimal stenosis at that time. Will discuss rpt at next OV.       Relevant Medications   atorvastatin (LIPITOR) 40 MG tablet   History of malignant melanoma of skin    Continue regular derm f/u.       Pulmonary nodule, right    Due for rpt CT this year to monitor 72m RLL ground glass nodule.  Will discuss at 349mo/u appt.       Amnestic MCI (mild cognitive impairment with memory loss)    Evidence of cognitive impairment on testing today with MMSE and CDT.  Discussed this. She continues living alone at home and driving - advised limiting driving, daughter will take more active part in  managing money and finances.  Reviewed 4 core lifestyle measures to support a healthy mind as per instructions.  Will start aricept '5mg'$  daily, monitoring for GI upset, cardiac conduction abnormality. Consider updating EKG next visit.  Pt/daughter agree with plan.       Chronic low back pain    Seeing PM&R Dr ChSharlet Salina On gabapentin '300mg'$  bid and norco for breakthrough symptoms. Unsure if she's taking flexeril - will check at home.       Other Visit Diagnoses     Acute foot pain, left            Meds ordered this encounter  Medications  . donepezil (ARICEPT) 5 MG tablet    Sig: Take 1 tablet (5 mg total) by mouth at bedtime.    Dispense:  30 tablet    Refill:  6  . omeprazole (PRILOSEC) 20 MG capsule    Sig: Take 1 capsule (20 mg total) by mouth daily.    Dispense:  90 capsule    Refill:  1  . atorvastatin (LIPITOR) 40 MG tablet    Sig: TAKE 1 TABLET BY MOUTH DAILY AT 6 PM.    Dispense:  90 tablet    Refill:  3   No orders of the defined types were placed in this encounter.    Patient instructions: Pass by xray for ankle xray today.  Go get second shingrix shot.  Check if you're still taking muscle relaxant flexeril (cyclobenzaprine) and let me know.  Bone density scan showed ostepenia but at increased fracture risk -  continue vitamin D 1000 units daily, goal calcium '1200mg'$ /day, ideally most in diet. Consider adding calcium supplement daily '600mg'$  on days you don't do good with calcium in the diet. Regular walking as much as able.  Try every other day omeprazole. If worsening reflux, may return to daily dosing.  Bring [place] a copy of your living will.  Memory testing today suspicious for some difficulty.  start aricept  '5mg'$  daily sent to pharmacy Return in 3 months for follow up visit   4 core lifestyle measures to support a healthy mind:  1. Nutritious well balance diet.  2. Regular physical activity routine.  3. Regular mental activity such as reading books, word  puzzles, math puzzles, jigsaw puzzles.  4. Social engagement.  Also ensure good blood pressure control, limit alcohol, no smoking.    Follow up plan: Return in about 3 months (around 08/12/2022), or if symptoms worsen or fail to improve, for follow up visit.  Ria Bush, MD

## 2022-05-12 NOTE — Patient Instructions (Addendum)
Pass by xray for ankle xray today.  Go get second shingrix shot.  Check if you're still taking muscle relaxant flexeril (cyclobenzaprine) and let me know.  Bone density scan showed ostepenia but at increased fracture risk - continue vitamin D 1000 units daily, goal calcium '1200mg'$ /day, ideally most in diet. Consider adding calcium supplement daily '600mg'$  on days you don't do good with calcium in the diet. Regular walking as much as able.  Try every other day omeprazole. If worsening reflux, may return to daily dosing.  Bring [place] a copy of your living will.  Memory testing today suspicious for some difficulty.  Return in 3 months for follow up visit  start aricept  '5mg'$  daily sent to pharmacy   4 core lifestyle measures to support a healthy mind:  1. Nutritious well balance diet.  2. Regular physical activity routine.  3. Regular mental activity such as reading books, word puzzles, math puzzles, jigsaw puzzles.  4. Social engagement.  Also ensure good blood pressure control, limit alcohol, no smoking.    Health Maintenance After Age 76 After age 76, you are at a higher risk for certain long-term diseases and infections as well as injuries from falls. Falls are a major cause of broken bones and head injuries in people who are older than age 76. Getting regular preventive care can help to keep you healthy and well. Preventive care includes getting regular testing and making lifestyle changes as recommended by your health care provider. Talk with your health care provider about: Which screenings and tests you should have. A screening is a test that checks for a disease when you have no symptoms. A diet and exercise plan that is right for you. What should I know about screenings and tests to prevent falls? Screening and testing are the best ways to find a health problem early. Early diagnosis and treatment give you the best chance of managing medical conditions that are common after age 76. Certain  conditions and lifestyle choices may make you more likely to have a fall. Your health care provider may recommend: Regular vision checks. Poor vision and conditions such as cataracts can make you more likely to have a fall. If you wear glasses, make sure to get your prescription updated if your vision changes. Medicine review. Work with your health care provider to regularly review all of the medicines you are taking, including over-the-counter medicines. Ask your health care provider about any side effects that may make you more likely to have a fall. Tell your health care provider if any medicines that you take make you feel dizzy or sleepy. Strength and balance checks. Your health care provider may recommend certain tests to check your strength and balance while standing, walking, or changing positions. Foot health exam. Foot pain and numbness, as well as not wearing proper footwear, can make you more likely to have a fall. Screenings, including: Osteoporosis screening. Osteoporosis is a condition that causes the bones to get weaker and break more easily. Blood pressure screening. Blood pressure changes and medicines to control blood pressure can make you feel dizzy. Depression screening. You may be more likely to have a fall if you have a fear of falling, feel depressed, or feel unable to do activities that you used to do. Alcohol use screening. Using too much alcohol can affect your balance and may make you more likely to have a fall. Follow these instructions at home: Lifestyle Do not drink alcohol if: Your health care provider tells you  not to drink. If you drink alcohol: Limit how much you have to: 0-1 drink a day for women. 0-2 drinks a day for men. Know how much alcohol is in your drink. In the [place] one drink equals one 12 oz bottle of beer (355 mL), one 5 oz glass of wine (148 mL), or one 1 oz glass of hard liquor (44 mL). Do not use any products that contain nicotine or tobacco.  These products include cigarettes, chewing tobacco, and vaping devices, such as e-cigarettes. If you need help quitting, ask your health care provider. Activity  Follow a regular exercise program to stay fit. This will help you maintain your balance. Ask your health care provider what types of exercise are appropriate for you. If you need a cane or walker, use it as recommended by your health care provider. Wear supportive shoes that have nonskid soles. Safety  Remove any tripping hazards, such as rugs, cords, and clutter. Install safety equipment such as grab bars in bathrooms and safety rails on stairs. Keep rooms and walkways well-lit. General instructions Talk with your health care provider about your risks for falling. Tell your health care provider if: You fall. Be sure to tell your health care provider about all falls, even ones that seem minor. You feel dizzy, tiredness (fatigue), or off-balance. Take over-the-counter and prescription medicines only as told by your health care provider. These include supplements. Eat a healthy diet and maintain a healthy weight. A healthy diet includes low-fat dairy products, low-fat (lean) meats, and fiber from whole grains, beans, and lots of fruits and vegetables. Stay current with your vaccines. Schedule regular health, dental, and eye exams. Summary Having a healthy lifestyle and getting preventive care can help to protect your health and wellness after age 76. Screening and testing are the best way to find a health problem early and help you avoid having a fall. Early diagnosis and treatment give you the best chance for managing medical conditions that are more common for people who are older than age 76. Falls are a major cause of broken bones and head injuries in people who are older than age 76. Take precautions to prevent a fall at home. Work with your health care provider to learn what changes you can make to improve your health and wellness  and to prevent falls. This information is not intended to replace advice given to you by your health care provider. Make sure you discuss any questions you have with your health care provider. Document Revised: 02/16/2021 Document Reviewed: 02/16/2021 Elsevier Patient Education  [employer].

## 2022-05-13 DIAGNOSIS — G3184 Mild cognitive impairment, so stated: Secondary | ICD-10-CM | POA: Insufficient documentation

## 2022-05-13 DIAGNOSIS — G8929 Other chronic pain: Secondary | ICD-10-CM | POA: Insufficient documentation

## 2022-05-13 MED ORDER — ATORVASTATIN CALCIUM 40 MG PO TABS
ORAL_TABLET | ORAL | 3 refills | Status: DC
Start: 2022-05-13 — End: 2023-06-22

## 2022-05-13 MED ORDER — OMEPRAZOLE 20 MG PO CPDR
20.0000 mg | DELAYED_RELEASE_CAPSULE | Freq: Every day | ORAL | 1 refills | Status: DC
Start: 2022-05-13 — End: 2023-01-06

## 2022-05-13 NOTE — Assessment & Plan Note (Addendum)
Discussed calcium and vitamin D intake. rec '1200mg'$  cal/day, ideally most in diet and if not reaching that then may add '600mg'$  calcium supplement.  Activity limited by chronic back pain.  Discussed she is at increased fracture risk based on FRAX score. Discussed option of osteoporosis bone rebuilding treatment, she desires to avoid for now. rec rpt DEXA 2 yrs.

## 2022-05-13 NOTE — Assessment & Plan Note (Signed)
Continue regular derm f/u.

## 2022-05-13 NOTE — Assessment & Plan Note (Addendum)
Due for rpt CT this year to monitor 25m RLL ground glass nodule.  Will discuss at 324mo/u appt.

## 2022-05-13 NOTE — Assessment & Plan Note (Signed)
LFTs normal

## 2022-05-13 NOTE — Assessment & Plan Note (Addendum)
S/p corneal transplant continues preventative valtrex '1000mg'$  nightly.

## 2022-05-13 NOTE — Assessment & Plan Note (Addendum)
Overall stable period on daily atorvastatin however LDL above goal in diabetic - continue statin, reviewed diet choices to improve LDL levels. The 10-year ASCVD risk score (Arnett DK, et al., 2019) is: 43%   Values used to calculate the score:     Age: 76 years     Sex: Female     Is Non-Hispanic African American: No     Diabetic: Yes     Tobacco smoker: No     Systolic Blood Pressure: 212 mmHg     Is BP treated: Yes     HDL Cholesterol: 55.9 mg/dL     Total Cholesterol: 190 mg/dL

## 2022-05-13 NOTE — Assessment & Plan Note (Signed)
Seeing PM&R Dr Sharlet Salina.  On gabapentin '300mg'$  bid and norco for breakthrough symptoms. Unsure if she's taking flexeril - will check at home.

## 2022-05-13 NOTE — Assessment & Plan Note (Signed)
Stable period on daily omeprazole '20mg'$ . Did suggest trying QOD dosing to see if reflux symptoms remain well controlled

## 2022-05-13 NOTE — Assessment & Plan Note (Signed)
Evidence of cognitive impairment on testing today with MMSE and CDT.  Discussed this. She continues living alone at home and driving - advised limiting driving, daughter will take more active part in managing money and finances.  Reviewed 4 core lifestyle measures to support a healthy mind as per instructions.  Will start aricept '5mg'$  daily, monitoring for GI upset, cardiac conduction abnormality. Consider updating EKG next visit.  Pt/daughter agree with plan.

## 2022-05-13 NOTE — Assessment & Plan Note (Signed)
Consider rpt colonoscopy 2025

## 2022-05-13 NOTE — Assessment & Plan Note (Signed)
Chronic, stable. Continue current regimen. 

## 2022-05-13 NOTE — Assessment & Plan Note (Signed)
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 Has been on sertraline '100mg'$  daily for prolonged time, does not desire to taper at this time. Notes ongoing difficulty with depressed mood despite low PHQ9 score

## 2022-05-13 NOTE — Assessment & Plan Note (Signed)
Historically diet controlled and largely well controlled however recent A1c was higher 7.1% - recommend renewed efforts to follow low sugar low carb diabetic diet. She does enjoy sweets, endorses frequent dietary liberties.

## 2022-05-13 NOTE — Assessment & Plan Note (Signed)
Last [place] 2019, minimal stenosis at that time. Will discuss rpt at next OV.

## 2022-05-17 ENCOUNTER — Telehealth: Payer: Self-pay | Admitting: Family Medicine

## 2022-05-17 DIAGNOSIS — Z7189 Other specified counseling: Secondary | ICD-10-CM

## 2022-05-17 NOTE — Telephone Encounter (Signed)
Placed ppw in Dr. G's box.  ?

## 2022-05-17 NOTE — Telephone Encounter (Signed)
Patient came in office and dropped on healthcare paperwork and has been placed in PCP folder.

## 2022-05-25 NOTE — Addendum Note (Signed)
Addended by: Ria Bush on: 05/25/2022 05:16 PM   Modules accepted: Orders

## 2022-05-25 NOTE — Assessment & Plan Note (Signed)
Advanced directive - scanned into chart 05/2022. Son Percell Miller and daughter Kearney Hard are HCPOA. Does not want prolonged life support if terminal condition. Advanced directive overrides HCPOA.

## 2022-05-25 NOTE — Telephone Encounter (Addendum)
Records reviewed, HCPOA sent for scanning.

## 2022-06-15 ENCOUNTER — Ambulatory Visit: Payer: Medicare HMO | Admitting: Dermatology

## 2022-06-15 DIAGNOSIS — D23112 Other benign neoplasm of skin of right lower eyelid, including canthus: Secondary | ICD-10-CM

## 2022-06-15 DIAGNOSIS — D229 Melanocytic nevi, unspecified: Secondary | ICD-10-CM

## 2022-06-15 DIAGNOSIS — S80861A Insect bite (nonvenomous), right lower leg, initial encounter: Secondary | ICD-10-CM

## 2022-06-15 DIAGNOSIS — D2239 Melanocytic nevi of other parts of face: Secondary | ICD-10-CM

## 2022-06-15 DIAGNOSIS — S80862A Insect bite (nonvenomous), left lower leg, initial encounter: Secondary | ICD-10-CM

## 2022-06-15 DIAGNOSIS — L578 Other skin changes due to chronic exposure to nonionizing radiation: Secondary | ICD-10-CM | POA: Diagnosis not present

## 2022-06-15 DIAGNOSIS — L570 Actinic keratosis: Secondary | ICD-10-CM

## 2022-06-15 DIAGNOSIS — D2261 Melanocytic nevi of right upper limb, including shoulder: Secondary | ICD-10-CM | POA: Diagnosis not present

## 2022-06-15 DIAGNOSIS — Q825 Congenital non-neoplastic nevus: Secondary | ICD-10-CM

## 2022-06-15 DIAGNOSIS — Z1283 Encounter for screening for malignant neoplasm of skin: Secondary | ICD-10-CM

## 2022-06-15 DIAGNOSIS — W57XXXA Bitten or stung by nonvenomous insect and other nonvenomous arthropods, initial encounter: Secondary | ICD-10-CM

## 2022-06-15 DIAGNOSIS — D1800 Hemangioma unspecified site: Secondary | ICD-10-CM

## 2022-06-15 DIAGNOSIS — L821 Other seborrheic keratosis: Secondary | ICD-10-CM

## 2022-06-15 DIAGNOSIS — D239 Other benign neoplasm of skin, unspecified: Secondary | ICD-10-CM

## 2022-06-15 DIAGNOSIS — L814 Other melanin hyperpigmentation: Secondary | ICD-10-CM

## 2022-06-15 MED ORDER — MOMETASONE FUROATE 0.1 % EX CREA: TOPICAL_CREAM | CUTANEOUS | 0 refills | Status: DC

## 2022-06-15 NOTE — Patient Instructions (Addendum)
Cryotherapy Aftercare  Wash gently with soap and water everyday.   Apply Vaseline and Band-Aid daily until healed.    Start mometasone cream 1-2 times daily as needed for itch/insect bites. Avoid applying to face, groin, and axilla. Use as directed. Long-term use can cause thinning of the skin.  Topical steroids (such as triamcinolone, fluocinolone, fluocinonide, mometasone, clobetasol, halobetasol, betamethasone, hydrocortisone) can cause thinning and lightening of the skin if they are used for too long in the same area. Your physician has selected the right strength medicine for your problem and area affected on the body. Please use your medication only as directed by your physician to prevent side effects.    Seborrheic Keratosis  What causes seborrheic keratoses? Seborrheic keratoses are harmless, common skin growths that first appear during adult life.  As time goes by, more growths appear.  Some people may develop a large number of them.  Seborrheic keratoses appear on both covered and uncovered body parts.  They are not caused by sunlight.  The tendency to develop seborrheic keratoses can be inherited.  They vary in color from skin-colored to gray, brown, or even black.  They can be either smooth or have a rough, warty surface.   Seborrheic keratoses are superficial and look as if they were stuck on the skin.  Under the microscope this type of keratosis looks like layers upon layers of skin.  That is why at times the top layer may seem to fall off, but the rest of the growth remains and re-grows.    Treatment Seborrheic keratoses do not need to be treated, but can easily be removed in the office.  Seborrheic keratoses often cause symptoms when they rub on clothing or jewelry.  Lesions can be in the way of shaving.  If they become inflamed, they can cause itching, soreness, or burning.  Removal of a seborrheic keratosis can be accomplished by freezing, burning, or surgery. If any spot  bleeds, scabs, or grows rapidly, please return to have it checked, as these can be an indication of a skin cancer.  Melanoma ABCDEs  Melanoma is the most dangerous type of skin cancer, and is the leading cause of death from skin disease.  You are more likely to develop melanoma if you: Have light-colored skin, light-colored eyes, or red or blond hair Spend a lot of time in the sun Tan regularly, either outdoors or in a tanning bed Have had blistering sunburns, especially during childhood Have a close family member who has had a melanoma Have atypical moles or large birthmarks  Early detection of melanoma is key since treatment is typically straightforward and cure rates are extremely high if we catch it early.   The first sign of melanoma is often a change in a mole or a new dark spot.  The ABCDE system is a way of remembering the signs of melanoma.  A for asymmetry:  The two halves do not match. B for border:  The edges of the growth are irregular. C for color:  A mixture of colors are present instead of an even brown color. D for diameter:  Melanomas are usually (but not always) greater than 16m - the size of a pencil eraser. E for evolution:  The spot keeps changing in size, shape, and color.  Please check your skin once per month between visits. You can use a small mirror in front and a large mirror behind you to keep an eye on the back side or your body.  If you see any new or changing lesions before your next follow-up, please call to schedule a visit.  Please continue daily skin protection including broad spectrum sunscreen SPF 30+ to sun-exposed areas, reapplying every 2 hours as needed when you're outdoors.    Due to recent changes in healthcare laws, you may see results of your pathology and/or laboratory studies on MyChart before the doctors have had a chance to review them. We understand that in some cases there may be results that are confusing or concerning to you. Please  understand that not all results are received at the same time and often the doctors may need to interpret multiple results in order to provide you with the best plan of care or course of treatment. Therefore, we ask that you please give [place] 2 business days to thoroughly review all your results before contacting the office for clarification. Should we see a critical lab result, you will be contacted sooner.   If You Need Anything After Your Visit  If you have any questions or concerns for your doctor, please call our main line at [phone] and press option 4 to reach your doctor's medical assistant. If no one answers, please leave a voicemail as directed and we will return your call as soon as possible. Messages left after 4 pm will be answered the following business day.   You may also send [place] a message via [place]. We typically respond to MyChart messages within 1-2 business days.  For prescription refills, please ask your pharmacy to contact our office. Our fax number is [phone].  If you have an urgent issue when the clinic is closed that cannot wait until the next business day, you can page your doctor at the number below.    Please note that while we do our best to be available for urgent issues outside of office hours, we are not available 24/7.   If you have an urgent issue and are unable to reach [place], you may choose to seek medical care at your doctor's office, retail clinic, urgent care center, or emergency room.  If you have a medical emergency, please immediately call 911 or go to the emergency department.  Pager Numbers  - Dr. Nehemiah Massed: [phone]  - Dr. Laurence Ferrari: [phone]  - Dr. Nicole Kindred: [phone]  In the event of inclement weather, please call our main line at [phone] for an update on the status of any delays or closures.  Dermatology Medication Tips: Please keep the boxes that topical medications come in in order to help keep track of the instructions about  where and how to use these. Pharmacies typically print the medication instructions only on the boxes and not directly on the medication tubes.   If your medication is too expensive, please contact our office at [phone] option 4 or send [place] a message through [place].   We are unable to tell what your co-pay for medications will be in advance as this is different depending on your insurance coverage. However, we may be able to find a substitute medication at lower cost or fill out paperwork to get insurance to cover a needed medication.   If a prior authorization is required to get your medication covered by your insurance company, please allow [place] 1-2 business days to complete this process.  Drug prices often vary depending on where the prescription is filled and some pharmacies may offer cheaper prices.  The website www.goodrx.com contains coupons for medications through different pharmacies. The prices here do  not account for what the cost may be with help from insurance (it may be cheaper with your insurance), but the website can give you the price if you did not use any insurance.  - You can print the associated coupon and take it with your prescription to the pharmacy.  - You may also stop by our office during regular business hours and pick up a GoodRx coupon card.  - If you need your prescription sent electronically to a different pharmacy, notify our office through [facility] or by phone at [phone] option 4.     Si Usted Necesita Algo Despus de Su Visita  Tambin puede enviarnos un mensaje a travs de Pharmacist, community. Por lo general respondemos a los mensajes de MyChart en el transcurso de 1 a 2 das hbiles.  Para renovar recetas, por favor pida a su farmacia que se ponga en contacto con nuestra oficina. Harland Dingwall de fax es [place] [phone].  Si tiene un asunto urgente cuando la clnica est cerrada y que no puede esperar hasta el siguiente da hbil, puede  llamar/localizar a su doctor(a) al nmero que aparece a continuacin.   Por favor, tenga en cuenta que aunque hacemos todo lo posible para estar disponibles para asuntos urgentes fuera del horario de [place], no estamos disponibles las 24 horas del da, los 7 das de la [name].   Si tiene un problema urgente y no puede comunicarse con nosotros, puede optar por buscar atencin mdica  en el consultorio de su doctor(a), en una clnica privada, en un centro de atencin urgente o en una sala de emergencias.  Si tiene Engineering geologist, por favor llame inmediatamente al 911 o vaya a la sala de emergencias.  Nmeros de bper  - Dr. Nehemiah Massed: [phone]  - Dra. Moye: [phone]  - Dra. Nicole Kindred: [phone]  En caso de inclemencias del [place], por favor llame a Johnsie Kindred principal al [phone] para una actualizacin sobre el [place] de cualquier retraso o cierre.  Consejos para la medicacin en dermatologa: Por favor, guarde las cajas en las que vienen los medicamentos de uso tpico para ayudarle a seguir las instrucciones sobre dnde y cmo usarlos. Las farmacias generalmente imprimen las instrucciones del medicamento slo en las cajas y no directamente en los tubos del [place].   Si su medicamento es muy caro, por favor, pngase en contacto con Zigmund Daniel llamando al [phone] y presione la opcin 4 o envenos un mensaje a travs de Pharmacist, community.   No podemos decirle cul ser su copago por los medicamentos por adelantado ya que esto es diferente dependiendo de la cobertura de su seguro. Sin embargo, es posible que podamos encontrar un medicamento sustituto a Electrical engineer un formulario para que el seguro cubra el medicamento que se considera necesario.   Si se requiere una autorizacin previa para que su compaa de seguros [place] su medicamento, por favor permtanos de 1 a 2 das hbiles para completar este proceso.  Los precios de los medicamentos varan con  frecuencia dependiendo del Environmental consultant de dnde se surte la receta y alguna farmacias pueden ofrecer precios ms baratos.  El sitio web www.goodrx.com tiene cupones para medicamentos de Airline pilot. Los precios aqu no tienen en cuenta lo que podra costar con la ayuda del seguro (puede ser ms barato con su seguro), pero el sitio web puede darle el precio si no utiliz Research scientist (physical sciences).  - Puede imprimir el cupn correspondiente y llevarlo con su receta a la farmacia.  -  Tambin puede pasar por nuestra oficina durante el horario de atencin regular y Charity fundraiser una tarjeta de cupones de GoodRx.  - Si necesita que su receta se enve electrnicamente a una farmacia diferente, informe a nuestra oficina a travs de MyChart de [place] o por telfono llamando al [phone] y presione la opcin 4.

## 2022-06-15 NOTE — Progress Notes (Signed)
Follow-Up Visit   Subjective  Beth Bailey is a 76 y.o. female who presents for the following: Annual Exam (The patient presents for Total-Body Skin Exam (TBSE) for skin cancer screening and mole check.  The patient has spots, moles and lesions to be evaluated, some may be new or changing and the patient has concerns that these could be cancer. Patient with hx of melanoma. ).   The following portions of the chart were reviewed this encounter and updated as appropriate:       Review of Systems:  No other skin or systemic complaints except as noted in HPI or Assessment and Plan.  Objective  Well appearing patient in no apparent distress; mood and affect are within normal limits.  A full examination was performed including scalp, head, eyes, ears, nose, lips, neck, chest, axillae, abdomen, back, buttocks, bilateral upper extremities, bilateral lower extremities, hands, feet, fingers, toes, fingernails, and toenails. All findings within normal limits unless otherwise noted below.  Left Posterior Shoulder Violaceous patch  Right Shoulder Flesh brown papule 1.0 cm  Mid Chin Flesh papule 0.6 cm  right posterior upper arm Pink.white scaly patch  right alar crease 2 mm pink flesh papule  right lower eyelid 5x3 mm white soft papule, stable when compared to photo  legs Pink excoriated papules     Assessment & Plan  Vascular birthmark Left Posterior Shoulder  Benign, observe.    Nevus (2) Right Shoulder; Mid Chin  Benign-appearing.  Observation.  Call clinic for new or changing lesions.  Recommend daily use of broad spectrum spf 30+ sunscreen to sun-exposed areas.    AK (actinic keratosis) right posterior upper arm  Vs ISK, recheck on f/up  Actinic keratoses are precancerous spots that appear secondary to cumulative UV radiation exposure/sun exposure over time. They are chronic with expected duration over 1 year. A portion of actinic keratoses will progress  to squamous cell carcinoma of the skin. It is not possible to reliably predict which spots will progress to skin cancer and so treatment is recommended to prevent development of skin cancer.  Recommend daily broad spectrum sunscreen SPF 30+ to sun-exposed areas, reapply every 2 hours as needed.  Recommend staying in the shade or wearing long sleeves, sun glasses (UVA+UVB protection) and wide brim hats (4-inch brim around the entire circumference of the hat). Call for new or changing lesions.    Destruction of lesion - right posterior upper arm  Destruction method: cryotherapy   Informed consent: discussed and consent obtained   Lesion destroyed using liquid nitrogen: Yes   Region frozen until ice ball extended beyond lesion: Yes   Outcome: patient tolerated procedure well with no complications   Post-procedure details: wound care instructions given   Additional details:  Prior to procedure, discussed risks of blister formation, small wound, skin dyspigmentation, or rare scar following cryotherapy. Recommend Vaseline ointment to treated areas while healing.   Fibrous papule of nose right alar crease  Benign, observe.    Hydrocystoma right lower eyelid  Stable. Benign, observe.    Insect bite of multiple sites with local reaction legs  Start mometasone cream 1-2 times daily as needed for itch/insect bites. Avoid applying to face, groin, and axilla. Use as directed. Long-term use can cause thinning of the skin.  Topical steroids (such as triamcinolone, fluocinolone, fluocinonide, mometasone, clobetasol, halobetasol, betamethasone, hydrocortisone) can cause thinning and lightening of the skin if they are used for too long in the same area. Your physician has selected the  right strength medicine for your problem and area affected on the body. Please use your medication only as directed by your physician to prevent side effects.    mometasone (ELOCON) 0.1 % cream - legs 1-2 times daily  to affected areas as needed for itch/bug bites. Avoid applying to face, groin, and axilla. Use as directed. Long-term use can cause thinning of the skin.   History of Melanoma  - No evidence of recurrence today at right back and right thigh. 2019 Derm path states right thigh but scar is on right calf. No scar on right thigh. - Recommend regular full body skin exams - Recommend daily broad spectrum sunscreen SPF 30+ to sun-exposed areas, reapply every 2 hours as needed.  - Call if any new or changing lesions are noted between office visits  Lentigines - Scattered tan macules - Due to sun exposure - Benign-appearing, observe - Recommend daily broad spectrum sunscreen SPF 30+ to sun-exposed areas, reapply every 2 hours as needed. - Call for any changes  Seborrheic Keratoses - Stuck-on, waxy, tan-brown papules and/or plaques  - Benign-appearing - Discussed benign etiology and prognosis. - Observe - Call for any changes  Melanocytic Nevi - Tan-brown and/or pink-flesh-colored symmetric macules and papules - Benign appearing on exam today - Observation - Call clinic for new or changing moles - Recommend daily use of broad spectrum spf 30+ sunscreen to sun-exposed areas.   Hemangiomas - Red papules - Discussed benign nature - Observe - Call for any changes  Actinic Damage - Chronic condition, secondary to cumulative UV/sun exposure - diffuse scaly erythematous macules with underlying dyspigmentation - Recommend daily broad spectrum sunscreen SPF 30+ to sun-exposed areas, reapply every 2 hours as needed.  - Staying in the shade or wearing long sleeves, sun glasses (UVA+UVB protection) and wide brim hats (4-inch brim around the entire circumference of the hat) are also recommended for sun protection.  - Call for new or changing lesions.  Skin cancer screening performed today.  Return in about 6 months (around 12/14/2022) for TBSE.  Graciella Belton, RMA, am acting as scribe for  [name], MD .  Documentation: I have reviewed the above documentation for accuracy and completeness, and I agree with the above.  [name] MD

## 2022-06-25 ENCOUNTER — Encounter (HOSPITAL_COMMUNITY): Payer: Self-pay | Admitting: Emergency Medicine

## 2022-06-25 ENCOUNTER — Emergency Department (HOSPITAL_COMMUNITY)
Admission: EM | Admit: 2022-06-25 | Discharge: 2022-06-25 | Disposition: A | Discharge status: Home / Self Care | Payer: Medicare HMO | Attending: Emergency Medicine | Admitting: Emergency Medicine

## 2022-06-25 ENCOUNTER — Emergency Department (HOSPITAL_COMMUNITY): Payer: Medicare HMO

## 2022-06-25 DIAGNOSIS — E119 Type 2 diabetes mellitus without complications: Secondary | ICD-10-CM | POA: Diagnosis not present

## 2022-06-25 DIAGNOSIS — Z91040 Latex allergy status: Secondary | ICD-10-CM | POA: Diagnosis not present

## 2022-06-25 DIAGNOSIS — Z79899 Other long term (current) drug therapy: Secondary | ICD-10-CM | POA: Diagnosis not present

## 2022-06-25 DIAGNOSIS — K5730 Diverticulosis of large intestine without perforation or abscess without bleeding: Secondary | ICD-10-CM

## 2022-06-25 DIAGNOSIS — I10 Essential (primary) hypertension: Secondary | ICD-10-CM | POA: Insufficient documentation

## 2022-06-25 DIAGNOSIS — K7689 Other specified diseases of liver: Secondary | ICD-10-CM | POA: Diagnosis not present

## 2022-06-25 DIAGNOSIS — R1031 Right lower quadrant pain: Secondary | ICD-10-CM

## 2022-06-25 DIAGNOSIS — K5790 Diverticulosis of intestine, part unspecified, without perforation or abscess without bleeding: Secondary | ICD-10-CM | POA: Diagnosis not present

## 2022-06-25 DIAGNOSIS — R109 Unspecified abdominal pain: Secondary | ICD-10-CM | POA: Diagnosis present

## 2022-06-25 LAB — URINALYSIS, ROUTINE W REFLEX MICROSCOPIC
Bacteria, UA: NONE SEEN
Bilirubin Urine: NEGATIVE
Glucose, UA: NEGATIVE mg/dL
Hgb urine dipstick: NEGATIVE
Ketones, ur: NEGATIVE mg/dL
Nitrite: NEGATIVE
Protein, ur: NEGATIVE mg/dL
Specific Gravity, Urine: 1.015 (ref 1.005–1.030)
pH: 5 (ref 5.0–8.0)

## 2022-06-25 LAB — COMPREHENSIVE METABOLIC PANEL
ALT: 23 U/L (ref 0–44)
AST: 26 U/L (ref 15–41)
Albumin: 4 g/dL (ref 3.5–5.0)
Alkaline Phosphatase: 69 U/L (ref 38–126)
Anion gap: 10 (ref 5–15)
BUN: 10 mg/dL (ref 8–23)
CO2: 28 mmol/L (ref 22–32)
Calcium: 10 mg/dL (ref 8.9–10.3)
Chloride: 102 mmol/L (ref 98–111)
Creatinine, Ser: 0.85 mg/dL (ref 0.44–1.00)
GFR, Estimated: >60 mL/min (ref >60)
Glucose, Bld: 149 mg/dL — ABNORMAL HIGH (ref 70–99)
Potassium: 4 mmol/L (ref 3.5–5.1)
Sodium: 140 mmol/L (ref 135–145)
Total Bilirubin: 0.7 mg/dL (ref 0.3–1.2)
Total Protein: 6.8 g/dL (ref 6.5–8.1)

## 2022-06-25 LAB — CBC
HCT: 38.9 % (ref 36.0–46.0)
Hemoglobin: 12.8 g/dL (ref 12.0–15.0)
MCH: 30.5 pg (ref 26.0–34.0)
MCHC: 32.9 g/dL (ref 30.0–36.0)
MCV: 92.8 fL (ref 80.0–100.0)
Platelets: 164 10*3/uL (ref 150–400)
RBC: 4.19 MIL/uL (ref 3.87–5.11)
RDW: 13.1 % (ref 11.5–15.5)
WBC: 10.2 10*3/uL (ref 4.0–10.5)
nRBC: 0 % (ref 0.0–0.2)

## 2022-06-25 LAB — LIPASE, BLOOD: Lipase: 38 U/L (ref 11–51)

## 2022-06-25 MED ORDER — IOHEXOL 350 MG/ML SOLN
70.0000 mL | Freq: Once | INTRAVENOUS | Status: AC | PRN
  Administered 2022-06-25: 70 mL via INTRAVENOUS

## 2022-06-25 MED ORDER — MORPHINE SULFATE (PF) 4 MG/ML IV SOLN
4.0000 mg | Freq: Once | INTRAVENOUS | Status: AC
  Administered 2022-06-25: 4 mg via INTRAVENOUS
  Filled 2022-06-25: qty 1

## 2022-06-25 MED ORDER — AMOXICILLIN-POT CLAVULANATE 875-125 MG PO TABS: 1.0000 | ORAL_TABLET | Freq: Two times a day (BID) | ORAL | 0 refills | Status: DC

## 2022-06-25 NOTE — ED Triage Notes (Signed)
Patient here with complaint of stabbing right sided abdominal pain that is exacerbated by movement, reports history of diverticulitis and states this pain feels similar to that. Pain started yesterday.

## 2022-06-25 NOTE — Discharge Instructions (Addendum)
You have been evaluated for your symptoms.  Fortunately your labs are reassuring.  CT scan of the abdomen pelvis did not show any concerning finding.  You do have evidence of diverticulosis.  Your symptoms may be early onsets of diverticulitis.  Please continue to take your pain medication at home however after 24 hours any you notice worsening of symptoms including worsening abdominal pain fever nausea please take antibiotic Augmentin prescribed as treatment for suspected diverticulitis.  If your symptoms worsen and you are concerned please return for further management.

## 2022-06-25 NOTE — ED Provider Notes (Signed)
[facility] EMERGENCY DEPARTMENT Provider Note   CSN: [id number] Arrival date & time: 06/25/22  4097     History  Chief Complaint  Patient presents with   Abdominal Pain    Beth Bailey is a 76 y.o. female.  The history is provided by the patient, a relative and medical records. No language interpreter was used.  Abdominal Pain    76 year old female significant history of GERD, hypertension, history of colon polyps and prior surgical history including abdominal hysterectomy and cholecystectomy, remote history of diverticulitis who presents accompanied by daughter for evaluation of abdominal pain.  Patient reports since yesterday she has had intermittent pain about abdomen.  Pain is primarily to the right side of her abdomen describes a sharp stabbing sensation sometimes worse with movement and felt somewhat similar to diverticulitis that she has had in the past.  She notes mild nausea and chills last night.  She does not endorse any fever, chest pain, shortness of breath,  Home Medications Prior to Admission medications   Medication Sig Start Date End Date Taking? Authorizing Provider  atorvastatin (LIPITOR) 40 MG tablet TAKE 1 TABLET BY MOUTH DAILY AT 6 PM. 05/13/22   Ria Bush, MD  Biotin 5000 MCG CAPS Take 5,000 mcg by mouth 2 (two) times a week. As needed    [provider]  cetirizine (ZYRTEC) 10 MG tablet Take 10 mg by mouth at bedtime.     [provider]  Cholecalciferol (VITAMIN D3) 25 MCG (1000 UT) CAPS Take 1 capsule (1,000 Units total) by mouth daily. 03/04/21   Ria Bush, MD  cyclobenzaprine (FLEXERIL) 10 MG tablet Take 1 tablet (10 mg total) by mouth 3 (three) times daily as needed for muscle spasms. 05/25/22   Ria Bush, MD  donepezil (ARICEPT) 5 MG tablet Take 1 tablet (5 mg total) by mouth at bedtime. 05/12/22   Ria Bush, MD  gabapentin (NEURONTIN) 300 MG capsule Take 1 capsule (300 mg total) by  mouth 2 (two) times daily. 02/09/22   Ria Bush, MD  HYDROcodone-acetaminophen (NORCO/VICODIN) 5-325 MG tablet TAKE 1 TABLET BY MOUTH EVERY 6 HOURS AS NEEDED FOR PAIN. 12/23/15   Ria Bush, MD  losartan (COZAAR) 50 MG tablet Take 1 tablet (50 mg total) by mouth daily. 02/09/22   Ria Bush, MD  loteprednol (LOTEMAX) 0.5 % ophthalmic suspension 1 drop. At bedtime    [provider]  METAMUCIL FIBER PO Take by mouth as needed.    [provider]  MISC NATURAL PRODUCTS PO Take by mouth as needed. Fast Acting Dairy Digestive supplement    [provider]  mometasone (ELOCON) 0.1 % cream 1-2 times daily to affected areas as needed for itch/bug bites. Avoid applying to face, groin, and axilla. Use as directed. Long-term use can cause thinning of the skin. 06/15/22   [name], MD  Omega-3 Fatty Acids (FISH OIL PO) Take 1 capsule by mouth daily. OmegaXL - 2 capsule daily    [provider]  omeprazole (PRILOSEC) 20 MG capsule Take 1 capsule (20 mg total) by mouth daily. 05/13/22   Ria Bush, MD  Polyethyl Glycol-Propyl Glycol 0.4-0.3 % SOLN Place 1 drop into both eyes 3 (three) times daily as needed (for dry eyes).    [provider]  sertraline (ZOLOFT) 100 MG tablet Take 1 tablet (100 mg total) by mouth daily. 02/09/22   Ria Bush, MD  valACYclovir (VALTREX) 1000 MG tablet Take 1 tablet (1,000 mg total) by mouth at  bedtime. 02/09/22   Ria Bush, MD      Allergies    Amoxicillin, Atorvastatin, Diclofenac sodium, Erythromycin, Penicillins, Pravastatin, Simvastatin, Statins, Trifluridine, Latex, and Lisinopril    Review of Systems   Review of Systems  Gastrointestinal:  Positive for abdominal pain.  All other systems reviewed and are negative.   Physical Exam Updated Vital Signs BP (!) 161/70 (BP Location: Left Arm)   Pulse 76   Temp 98.5 F (36.9 C) (Oral)   Resp 18   SpO2 95%  Physical Exam Vitals and nursing  note reviewed.  Constitutional:      General: She is not in acute distress.    Appearance: She is well-developed.  HENT:     Head: Atraumatic.  Eyes:     Conjunctiva/sclera: Conjunctivae normal.  Cardiovascular:     Rate and Rhythm: Normal rate and regular rhythm.  Pulmonary:     Effort: Pulmonary effort is normal.  Abdominal:     General: Abdomen is flat.     Palpations: Abdomen is soft.     Tenderness: There is abdominal tenderness in the right lower quadrant. There is no guarding or rebound.  Musculoskeletal:     Cervical back: Neck supple.  Skin:    Findings: No rash.  Neurological:     Mental Status: She is alert.  Psychiatric:        Mood and Affect: Mood normal.     ED Results / Procedures / Treatments   Labs (all labs ordered are listed, but only abnormal results are displayed) Labs Reviewed  COMPREHENSIVE METABOLIC PANEL - Abnormal; Notable for the following components:      Result Value   Glucose, Bld 149 (*)    All other components within normal limits  URINALYSIS, ROUTINE W REFLEX MICROSCOPIC - Abnormal; Notable for the following components:   Leukocytes,Ua SMALL (*)    All other components within normal limits  URINE CULTURE  LIPASE, BLOOD  CBC    EKG None  Radiology CT ABDOMEN PELVIS W CONTRAST  Result Date: 06/25/2022 CLINICAL DATA:  Left lower quadrant abdominal pain. EXAM: CT ABDOMEN AND PELVIS WITH CONTRAST TECHNIQUE: Multidetector CT imaging of the abdomen and pelvis was performed using the standard protocol following bolus administration of intravenous contrast. RADIATION DOSE REDUCTION: This exam was performed according to the departmental dose-optimization program which includes automated exposure control, adjustment of the mA and/or kV according to patient size and/or use of iterative reconstruction technique. CONTRAST:  56m OMNIPAQUE IOHEXOL 350 MG/ML SOLN COMPARISON:  CT examination dated January 05, 2020 FINDINGS: Lower chest: Bibasilar  subsegmental linear atelectasis. No focal consolidation or pleural effusion. Hepatobiliary: 5.2 cm simple cysts in the right hepatic lobe, unchanged. Status post cholecystectomy. Prominence of the extrahepatic common duct, likely secondary to cholecystectomy. Pancreas: Unremarkable. No pancreatic ductal dilatation or surrounding inflammatory changes. Spleen: Normal in size without focal abnormality. Adrenals/Urinary Tract: Adrenal glands are unremarkable. Kidneys are normal, without renal calculi, focal lesion, or hydronephrosis. Bladder is unremarkable. Stomach/Bowel: Stomach is within normal limits. Appendix appears normal. Sigmoid colonic diverticulosis without evidence of acute diverticulitis. Vascular/Lymphatic: Aortic atherosclerosis. No enlarged abdominal or pelvic lymph nodes. Reproductive: Status post hysterectomy. No adnexal masses. Other: No abdominal wall hernia or abnormality. No abdominopelvic ascites. Musculoskeletal: Degenerate disc disease of the lumbar spine prominent at L4-L5 and L5-S1 with subchondral sclerosis and marginal osteophytes with associated facet joint arthropathy. IMPRESSION: 1. Bibasilar dependent atelectasis. No evidence of pneumonia or pleural effusion. 2. Sigmoid colonic diverticulosis without evidence of acute  diverticulitis. 3.  Normal appendix. 4.  Stable right hepatic simple cyst measuring up to 5.2 cm. 5. Degenerate disc disease of the lumbar spine prominent at L4-L5 and L5-S1. 6.  No CT evidence of acute abdominal/pelvic process. Electronically Signed   By: Keane Police D.O.   On: 06/25/2022 16:49    Procedures Procedures    Medications Ordered in ED Medications  morphine (PF) 4 MG/ML injection 4 mg (4 mg Intravenous Given 06/25/22 1602)  iohexol (OMNIPAQUE) 350 MG/ML injection 70 mL (70 mLs Intravenous Contrast Given 06/25/22 1628)    ED Course/ Medical Decision Making/ A&P                           Medical Decision Making Amount and/or Complexity of Data  Reviewed Labs: ordered.  Risk Prescription drug management.   BP (!) 161/70 (BP Location: Left Arm)   Pulse 76   Temp 98.5 F (36.9 C) (Oral)   Resp 18   SpO2 95%   6:20 PM This is a 76 year old female who has significant history of diverticulitis, history of colon polyps, diabetes, and also has had prior abdominal hysterectomy and cholecystectomy presenting complaining of abdominal pain.  She endorsed pain to her right lower quadrant ongoing since yesterday.  Pain seems to worsen with movement and is moderate in severity.  She endorsed mild nausea without vomiting.  She denies having fever or chills no runny nose sneezing or coughing no dysuria.  She mention his symptoms felt similar to prior diverticulitis.  She still has an intact appendix.  She did tried over-the-counter medication at home with some relief.  On exam, this is an elderly female appears to be in no acute discomfort.  Heart lung sounds normal.  She does have some tenderness to her right lower quadrant without guarding or rebound tenderness.  Abdomen is otherwise unremarkable.  She does not have any CVA tenderness to suggest kidney stone.  Labs and imaging obtained independently viewed interpreted by me and agree with radiologist interpretation.  Urinalysis without signs of urinary tract infection, normal lipase, electrolyte panels are reassuring, normal WBC, normal H&H.  CT scan of the abdomen pelvis obtained and fortunately did not show any concerning finding.  There is evidence of sigmoid colonic diverticulosis without evidence of diverticulitis.  She has a normal appendix.  Given her symptoms felt somewhat similar to prior diverticulitis and she does have evidence of diverticulosis, after discussion, plan to prescribe patient Augmentin available however she should wait for the next 24 hours and if her symptoms progress then she may benefit from antibiotic.  She does have opiate pain medication at home to take.  Return  precaution given.  Patient is stable to be discharged home.  This patient presents to the ED for concern of abd pain, this involves an extensive number of treatment options, and is a complaint that carries with it a high risk of complications and morbidity.  The differential diagnosis includes appendicitis, colitis, diverticulitis, UTI, kidney stone, MSk pain  Co morbidities that complicate the patient evaluation diverticulosis  DM Additional history obtained:  Additional history obtained from family member External records from outside source obtained and reviewed including EMR include prior labs and imaging  Lab Tests:  I Ordered, and personally interpreted labs.  The pertinent results include:  as above  Imaging Studies ordered:  I ordered imaging studies including abd/pelvis CT I independently visualized and interpreted imaging which showed diverticulosis I agree with the  radiologist interpretation  Cardiac Monitoring:  The patient was maintained on a cardiac monitor.  I personally viewed and interpreted the cardiac monitored which showed an underlying rhythm of: NSR  Medicines ordered and prescription drug management:  I ordered medication including morphine  for pain Reevaluation of the patient after these medicines showed that the patient improved I have reviewed the patients home medicines and have made adjustments as needed  Test Considered: as above  Critical Interventions: IV opiate  Consultations Obtained:  I requested consultation with the attending Dr. Johnney Killian,  and discussed lab and imaging findings as well as pertinent plan - they recommend: outpt f/u  Problem List / ED Course: RLQ pain  Reevaluation:  After the interventions noted above, I reevaluated the patient and found that they have :improved  Social Determinants of Health: inactivity  Dispostion:  After consideration of the diagnostic results and the patients response to treatment, I feel  that the patent would benefit from outpt f/u.           Final Clinical Impression(s) / ED Diagnoses Final diagnoses:  RLQ abdominal pain  Diverticulosis of colon without hemorrhage    Rx / DC Orders ED Discharge Orders          Ordered    amoxicillin-clavulanate (AUGMENTIN) 875-125 MG tablet  Every 12 hours        06/25/22 1826              Domenic Moras, PA-C 06/25/22 1827    Charlesetta Shanks, MD 06/25/22 2044

## 2022-06-25 NOTE — ED Provider Notes (Signed)
I provided a substantive portion of the care of this patient.  I personally performed the entirety of the medical decision making for this encounter.     Has history of diverticulitis.  Reports yesterday she started getting abdominal pain that was at first intermittent and not too severe but is gotten increasingly severe and as of yesterday night she had sharp and intense pains in her lower and right side of the abdomen.  She reports it felt similar to diverticulitis.  It was worse with trying to lie on her right side.  No fevers no vomiting.  She has gotten a dose of morphine which she reports has improved her pain.  Patient is alert nontoxic.  No respiratory distress.  Abdomen is soft but she does have reproducible pain in the left lower quadrant which radiates to the right.  Some central periumbilical discomfort to palpation.  No guarding.  Lower extremities no peripheral edema.  Feet are warm and dry.  Calf soft and nontender.  I have personally reviewed the patient's CT scan.  Per radiology no evidence at this time of diverticulitis but positive for diverticulosis.  Other findings include a stable 5 cm hepatic cyst.  At this time with patient having reproducible pain and history of diverticulitis consideration is for early diverticulitis without CT findings at this time.  I have extensively discussed the plan with the patient and her daughter.  She does have hydrocodone at home for back pain.  She will take it as needed for the next 24 hours.  We will provide a prescription for Augmentin to be started if patient has persisting pain or develops fever.  Patient has amoxicillin listed as an allergy however we have extensively reviewed this and the patient reports that it was a very long time ago and it was unclear if the amoxicillin had caused a reaction or it was a contact dermatitis.  This time, for empiric antibiotic choosing Augmentin over Flagyl Cipro side effect profile.  At this time I do think  patient is stable to empirically start Augmentin this weekend if the symptoms are persisting or worsening.  No signs of imminent perforation or obstruction.  Have reviewed return precautions and follow-up plan.   Charlesetta Shanks, MD 06/25/22 [phone]

## 2022-06-27 LAB — URINE CULTURE: Culture: <10000 — AB

## 2022-07-05 ENCOUNTER — Telehealth: Payer: Self-pay

## 2022-07-05 NOTE — Chronic Care Management (AMB) (Signed)
Chronic Care Management Pharmacy Assistant   Name: Piera Downs  MRN: [id number] DOB: 1945/11/04   Reason for Encounter: Hospital Follow Up  CCS    Medications: Outpatient Encounter Medications as of 07/05/2022  Medication Sig   amoxicillin-clavulanate (AUGMENTIN) 875-125 MG tablet Take 1 tablet by mouth every 12 (twelve) hours.   atorvastatin (LIPITOR) 40 MG tablet TAKE 1 TABLET BY MOUTH DAILY AT 6 PM.   Biotin 5000 MCG CAPS Take 5,000 mcg by mouth 2 (two) times a week. As needed   cetirizine (ZYRTEC) 10 MG tablet Take 10 mg by mouth at bedtime.    Cholecalciferol (VITAMIN D3) 25 MCG (1000 UT) CAPS Take 1 capsule (1,000 Units total) by mouth daily.   cyclobenzaprine (FLEXERIL) 10 MG tablet Take 1 tablet (10 mg total) by mouth 3 (three) times daily as needed for muscle spasms.   donepezil (ARICEPT) 5 MG tablet Take 1 tablet (5 mg total) by mouth at bedtime.   gabapentin (NEURONTIN) 300 MG capsule Take 1 capsule (300 mg total) by mouth 2 (two) times daily.   HYDROcodone-acetaminophen (NORCO/VICODIN) 5-325 MG tablet TAKE 1 TABLET BY MOUTH EVERY 6 HOURS AS NEEDED FOR PAIN.   losartan (COZAAR) 50 MG tablet Take 1 tablet (50 mg total) by mouth daily.   loteprednol (LOTEMAX) 0.5 % ophthalmic suspension 1 drop. At bedtime   METAMUCIL FIBER PO Take by mouth as needed.   MISC NATURAL PRODUCTS PO Take by mouth as needed. Fast Acting Dairy Digestive supplement   mometasone (ELOCON) 0.1 % cream 1-2 times daily to affected areas as needed for itch/bug bites. Avoid applying to face, groin, and axilla. Use as directed. Long-term use can cause thinning of the skin.   Omega-3 Fatty Acids (FISH OIL PO) Take 1 capsule by mouth daily. OmegaXL - 2 capsule daily   omeprazole (PRILOSEC) 20 MG capsule Take 1 capsule (20 mg total) by mouth daily.   Polyethyl Glycol-Propyl Glycol 0.4-0.3 % SOLN Place 1 drop into both eyes 3 (three) times daily as needed (for dry eyes).   sertraline (ZOLOFT) 100 MG  tablet Take 1 tablet (100 mg total) by mouth daily.   valACYclovir (VALTREX) 1000 MG tablet Take 1 tablet (1,000 mg total) by mouth at bedtime.   No facility-administered encounter medications on file as of 07/05/2022.      Reviewed hospital notes for details of recent visit. Has patient been contacted by Transitions of Care team? No Has patient seen PCP/specialist for hospital follow up (summarize OV if yes): No  Admitted to the ED on 06/25/22. Discharge date was 06/25/22.  Discharged from [facility] ED . Discharge diagnosis (Principal Problem): RLQ abdominal pain  Patient was discharged to Home  Brief summary of hospital course: Labs and imaging obtained independently viewed interpreted by me and agree with radiologist interpretation.  Urinalysis without signs of urinary tract infection, normal lipase, electrolyte panels are reassuring, normal WBC, normal H&H.  CT scan of the abdomen pelvis obtained and fortunately did not show any concerning finding.  There is evidence of sigmoid colonic diverticulosis without evidence of diverticulitis.  plan to prescribe patient Augmentin available however she should wait for the next 24 hours and if her symptoms progress then she may benefit from antibiotic  New?Medications Started at [facility] Discharge:?? -Started amoxicillin 800-125 mg take 1 tablet every 12 hours  Medications that remain the same after Hospital Discharge:??  -All other medications will remain the same.    Next CCM appt: none  Other  upcoming appts: PCP appointment on 08/13/22  [name], PharmD notified and will determine if action is needed.   Avel Sensor, Allison  [phone]

## 2022-07-13 ENCOUNTER — Ambulatory Visit: Payer: Medicare HMO | Admitting: Dermatology

## 2022-08-13 ENCOUNTER — Ambulatory Visit (INDEPENDENT_AMBULATORY_CARE_PROVIDER_SITE_OTHER): Payer: Medicare HMO | Admitting: Family Medicine

## 2022-08-13 ENCOUNTER — Encounter: Payer: Self-pay | Admitting: Family Medicine

## 2022-08-13 VITALS — BP 122/72 | HR 66 | Temp 97.4°F | Ht <= 58 in | Wt 124.4 lb

## 2022-08-13 DIAGNOSIS — R4589 Other symptoms and signs involving emotional state: Secondary | ICD-10-CM | POA: Diagnosis not present

## 2022-08-13 DIAGNOSIS — E118 Type 2 diabetes mellitus with unspecified complications: Secondary | ICD-10-CM

## 2022-08-13 DIAGNOSIS — R911 Solitary pulmonary nodule: Secondary | ICD-10-CM

## 2022-08-13 DIAGNOSIS — I10 Essential (primary) hypertension: Secondary | ICD-10-CM | POA: Diagnosis not present

## 2022-08-13 DIAGNOSIS — G3184 Mild cognitive impairment, so stated: Secondary | ICD-10-CM

## 2022-08-13 LAB — POCT GLYCOSYLATED HEMOGLOBIN (HGB A1C): Hemoglobin A1C: 6.4 % — AB (ref 4.0–5.6)

## 2022-08-13 MED ORDER — DONEPEZIL HCL 10 MG PO TABS: 10.0000 mg | ORAL_TABLET | Freq: Every day | ORAL | 11 refills | Status: DC

## 2022-08-13 NOTE — Patient Instructions (Addendum)
We will check CT of chest and if lung nodule is stable, ok to stop monitoring. We will call you to get this scheduled. EKG today Increase aricept '5mg'$  to 2 tablets at night time. Let us know if any trouble tolerating this.  Good to see you today. Return in 3 months for follow up visit.

## 2022-08-13 NOTE — Progress Notes (Unsigned)
Patient ID: Beth Bailey, female    DOB: 01-08-1946, 76 y.o.   MRN: [id number]  This visit was conducted in person.  BP 122/72   Pulse 66   Temp (!) 97.4 F (36.3 C) (Temporal)   Ht '4\' 10"'$  (1.473 m)   Wt 124 lb 6 oz (56.4 kg)   SpO2 95%   BMI 25.99 kg/m    CC: 3 mo f/u visit  Subjective:   HPI: Beth Bailey is a 76 y.o. female presenting on 08/13/2022 for Follow-up (Here for 3 mo f/u.  Pt accompanied by daughter, Sharyn Lull. )   [phone] Sharyn Lull daughter's cell) See prior note for details.  Last visit diagnosed MCI vs early dementia. Daughter has taken over finances and medication management. Last visit we started aricept '5mg'$  daily. Since starting she notes upset stomach with loose stools and diarrhea which flares hemorrhoids. Doesn't think related to aricept.   GERD well controlled on daily dosing.   05/2022 - Mental Status Exam (MMSE): 22/30 (value/max value)   Clock Drawing Score:1/4     Relevant past medical, surgical, family and social history reviewed and updated as indicated. Interim medical history since our last visit reviewed. Allergies and medications reviewed and updated. Outpatient Medications Prior to Visit  Medication Sig Dispense Refill   atorvastatin (LIPITOR) 40 MG tablet TAKE 1 TABLET BY MOUTH DAILY AT 6 PM. 90 tablet 3   Biotin 5000 MCG CAPS Take 5,000 mcg by mouth 2 (two) times a week. As needed     Calcium Carbonate (CALCIUM 600 PO) Take 1 tablet by mouth daily.     cetirizine (ZYRTEC) 10 MG tablet Take 10 mg by mouth at bedtime.      Cholecalciferol (VITAMIN D3) 25 MCG (1000 UT) CAPS Take 1 capsule (1,000 Units total) by mouth daily. 30 capsule    cyclobenzaprine (FLEXERIL) 10 MG tablet Take 1 tablet (10 mg total) by mouth 3 (three) times daily as needed for muscle spasms.     gabapentin (NEURONTIN) 300 MG capsule Take 1 capsule (300 mg total) by mouth 2 (two) times daily. 180 capsule 3   HYDROcodone-acetaminophen  (NORCO/VICODIN) 5-325 MG tablet TAKE 1 TABLET BY MOUTH EVERY 6 HOURS AS NEEDED FOR PAIN. 30 tablet 0   losartan (COZAAR) 50 MG tablet Take 1 tablet (50 mg total) by mouth daily. 90 tablet 3   loteprednol (LOTEMAX) 0.5 % ophthalmic suspension 1 drop. At bedtime     METAMUCIL FIBER PO Take by mouth as needed.     MISC NATURAL PRODUCTS PO Take by mouth as needed. Fast Acting Dairy Digestive supplement     mometasone (ELOCON) 0.1 % cream 1-2 times daily to affected areas as needed for itch/bug bites. Avoid applying to face, groin, and axilla. Use as directed. Long-term use can cause thinning of the skin. 45 g 0   Omega-3 Fatty Acids (FISH OIL PO) Take 1 capsule by mouth daily. OmegaXL - 2 capsule daily     omeprazole (PRILOSEC) 20 MG capsule Take 1 capsule (20 mg total) by mouth daily. 90 capsule 1   Polyethyl Glycol-Propyl Glycol 0.4-0.3 % SOLN Place 1 drop into both eyes 3 (three) times daily as needed (for dry eyes).     sertraline (ZOLOFT) 100 MG tablet Take 1 tablet (100 mg total) by mouth daily. 90 tablet 3   valACYclovir (VALTREX) 1000 MG tablet Take 1 tablet (1,000 mg total) by mouth at bedtime. 90 tablet 3   donepezil (ARICEPT) 5 MG  tablet Take 1 tablet (5 mg total) by mouth at bedtime. 30 tablet 6   amoxicillin-clavulanate (AUGMENTIN) 875-125 MG tablet Take 1 tablet by mouth every 12 (twelve) hours. 14 tablet 0   No facility-administered medications prior to visit.     Per HPI unless specifically indicated in ROS section below Review of Systems  Objective:  BP 122/72   Pulse 66   Temp (!) 97.4 F (36.3 C) (Temporal)   Ht '4\' 10"'$  (1.473 m)   Wt 124 lb 6 oz (56.4 kg)   SpO2 95%   BMI 25.99 kg/m   Wt Readings from Last 3 Encounters:  08/13/22 124 lb 6 oz (56.4 kg)  05/12/22 124 lb 4 oz (56.4 kg)  03/11/22 115 lb (52.2 kg)      Physical Exam Vitals and nursing note reviewed.  Constitutional:      Appearance: Normal appearance. She is not ill-appearing.  Cardiovascular:      Rate and Rhythm: Normal rate and regular rhythm.     Pulses: Normal pulses.     Heart sounds: Normal heart sounds. No murmur heard. Pulmonary:     Effort: Pulmonary effort is normal. No respiratory distress.     Breath sounds: Normal breath sounds. No wheezing, rhonchi or rales.  Abdominal:     General: Bowel sounds are normal. There is no distension.     Palpations: Abdomen is soft. There is no mass.     Tenderness: There is no abdominal tenderness. There is no guarding or rebound.     Hernia: No hernia is present.  Musculoskeletal:     Right lower leg: No edema.     Left lower leg: No edema.  Skin:    General: Skin is warm and dry.     Findings: No erythema or rash.  Neurological:     Mental Status: She is alert.  Psychiatric:        Mood and Affect: Mood normal.        Behavior: Behavior normal.      EKG - sinus bradycardia high 50s, normal axis, intervals, no hypertrophy or acute ST/T changes.   Assessment & Plan:   Problem List Items Addressed This Visit     Controlled diabetes mellitus type 2 with complications ([place]) - Primary   Relevant Orders   POCT glycosylated hemoglobin (Hb A1C) (Completed)   Essential hypertension   Relevant Orders   EKG 12-Lead (Completed)     Meds ordered this encounter  Medications   donepezil (ARICEPT) 10 MG tablet    Sig: Take 1 tablet (10 mg total) by mouth at bedtime.    Dispense:  30 tablet    Refill:  11   Orders Placed This Encounter  Procedures   POCT glycosylated hemoglobin (Hb A1C)   EKG 12-Lead     Patient Instructions  We will check CT of chest and if lung nodule is stable, ok to stop monitoring. We will call you to get this scheduled. EKG today Increase aricept '5mg'$  to 2 tablets at night time. Let us know if any trouble tolerating this.  Good to see you today. Return in 3 months for follow up visit.   Follow up plan: Return in about 3 months (around 11/13/2022) for follow up visit.  Ria Bush, MD

## 2022-08-14 ENCOUNTER — Encounter: Payer: Self-pay | Admitting: Family Medicine

## 2022-08-14 NOTE — Assessment & Plan Note (Signed)
Chronic, stable on losartan.

## 2022-08-14 NOTE — Assessment & Plan Note (Addendum)
Stable period on aricept '5mg'$  nightly. Desires trial higher dose - will send '10mg'$  for next refill. Reviewed monitoring for arrhythmias or GI upset. Update EKG today.

## 2022-08-14 NOTE — Assessment & Plan Note (Addendum)
Due for rpt CT chest to follow nodule present since ~2019, 2020. If stable this time, discussed likely ok to stop monitoring.

## 2022-08-14 NOTE — Assessment & Plan Note (Signed)
Stable period on sertraline '100mg'$  daily, previously declined trial lower dose.

## 2022-08-14 NOTE — Assessment & Plan Note (Addendum)
Chronic, improved a1c. Remains well controlled with diet alone.

## 2022-08-16 ENCOUNTER — Encounter: Payer: Self-pay | Admitting: *Deleted

## 2022-09-01 ENCOUNTER — Telehealth: Payer: Self-pay

## 2022-09-01 NOTE — Telephone Encounter (Addendum)
Pt said started diarrhea every time she eats since last week; pt has lower mid to rt side abd pain that is dull and achy that comes and goes. No fever; pt said at times pain level is 5 - 6; right now not hurting a lot; pt  is trying to drink but in last 12 hrs has had watery diarrhea 6 - 7 times. No dry mouth at this time and no dizziness. Pt said they are trying to get started with Thanksgiving dinner preparations. Pt said she might go to UC near her home and if worsens will go to ED. Pt is going to try a liquid diet to see if that will help diarrhea. Pt has hx of diverticulitis. Tried to encourage pt to be evaluated and UC & ED precautions given and pt voiced understanding. Sending note to Dr Darnell Level and G pool and spoke with Dr Darnell Level. And he thinks pt should be evaluated. Pt notified and she said when her daughter got home she will go to UC that is near their home (pt cannot remember name).     [facility] - Client TELEPHONE ADVICE RECORD [facility] Patient Name: Beth Bailey Gender: Female DOB: [birth year] Age: 76 Y 4 M 29 D Return Phone Number: Address: City/ State/ Zip: [place] [address] Client [facility] - Client Client Site [place] Provider Ria Bush - MD Contact Type Call Who Is Calling Patient / Member / Family / Caregiver Call Type Triage / Clinical Relationship To Patient Self Return Phone Number Please choose phone number Chief Complaint SEVERE ABDOMINAL PAIN - Severe pain in abdomen Reason for Call Symptomatic / Request for Vancleave states she is having severe abdominal pains, diarrhea and she has not been able to eat without her having to run to the bathroom . She has diverticulitis. [place] Not Listed does not remember name of it, is down the street from her house Translation No Nurse Assessment Nurse: Ellery Plunk, RN, Danica Date/Time  ([place] Time): 09/01/2022 12:05:41 PM Confirm and document reason for call. If symptomatic, describe symptoms. ---caller states she is having diarrhea and stomach pain. rates pain 6/10. has had 5 stools this morning Does the patient have any new or worsening symptoms? ---Yes Will a triage be completed? ---Yes Related visit to physician within the last 2 weeks? ---Yes Does the PT have any chronic conditions? (i.e. diabetes, asthma, this includes High risk factors for pregnancy, etc.) ---Yes List chronic conditions. ---diverticulitis, memory issues, htn Is this a behavioral health or substance abuse call? ---No Guidelines Guideline Title Affirmed Question Affirmed Notes Nurse Date/Time (Eastern Time) Diarrhea [1] SEVERE diarrhea (e.g., 7 or more times / day more than normal) AND [2] age > 76 years Bringas, Ruther[place], Fredric Dine 09/01/2022 12:09:37 PM PLEASE NOTE: All timestamps contained within this report are represented as [place] Standard Time. CONFIDENTIALTY NOTICE: This fax transmission is intended only for the addressee. It contains information that is legally privileged, confidential or otherwise protected from use or disclosure. If you are not the intended recipient, you are strictly prohibited from reviewing, disclosing, copying using or disseminating any of this information or taking any action in reliance on or regarding this information. If you have received this fax in error, please notify us immediately by telephone so that we can arrange for its return to [place]. Phone: [phone], Toll-Free: [phone], Fax: [phone] Page: 2 of 2 Call Id: [id number] [place]. Time (  [place] Time) Disposition Final User 09/01/2022 12:02:58 PM Send to Urgent [facility] 09/01/2022 12:13:08 PM See HCP within 4 Hours (or PCP triage) Yes Ellery Plunk, RN, Danica Final Disposition 09/01/2022 12:13:08 PM See HCP within 4 Hours (or PCP triage) Yes Bringas, RN, Danica Caller Disagree/Comply  Comply Caller Understands Yes PreDisposition InappropriateToAsk Care Advice Given Per Guideline SEE HCP (OR PCP TRIAGE) WITHIN 4 HOURS: * IF OFFICE WILL BE OPEN: You need to be seen within the next 3 or 4 hours. Call your doctor (or NP/PA) now or as soon as the office opens. CLEAR FLUIDS: * Drink more fluids. * Sip water or a half-strength sports drink (e.g., Gatorade, Powerade; mix half and half with water). CALL BACK IF: * You become worse CARE ADVICE given per Diarrhea (Adult) guideline. Referrals GO TO FACILITY OTHER - SPECIF

## 2022-09-01 NOTE — Telephone Encounter (Signed)
Agree with office evaluation

## 2022-09-06 NOTE — Telephone Encounter (Signed)
I don't see where patient was evaluated for abd pain.  Please call for update - if ongoing, plz schedule OV this week.

## 2022-09-06 NOTE — Telephone Encounter (Signed)
Spoke with patient. Patient states symptoms improved over the last few days, she is still having some discomfort but she is able to eat better now. Patient would like to follow up. Appointment made 09/08/22 with PCP, no appointments on 09/07/22 at this time

## 2022-09-06 NOTE — Telephone Encounter (Signed)
Left message to call back  

## 2022-09-08 ENCOUNTER — Encounter: Payer: Self-pay | Admitting: Family Medicine

## 2022-09-08 ENCOUNTER — Ambulatory Visit (INDEPENDENT_AMBULATORY_CARE_PROVIDER_SITE_OTHER): Payer: Medicare HMO | Admitting: Family Medicine

## 2022-09-08 ENCOUNTER — Telehealth: Payer: Self-pay | Admitting: Family Medicine

## 2022-09-08 VITALS — BP 144/72 | HR 63 | Temp 97.3°F | Ht <= 58 in | Wt 121.2 lb

## 2022-09-08 DIAGNOSIS — R195 Other fecal abnormalities: Secondary | ICD-10-CM | POA: Insufficient documentation

## 2022-09-08 DIAGNOSIS — G3184 Mild cognitive impairment, so stated: Secondary | ICD-10-CM

## 2022-09-08 DIAGNOSIS — R1032 Left lower quadrant pain: Secondary | ICD-10-CM | POA: Insufficient documentation

## 2022-09-08 LAB — CBC WITH DIFFERENTIAL/PLATELET
Basophils Absolute: 0.1 10*3/uL (ref 0.0–0.1)
Basophils Relative: 1.1 % (ref 0.0–3.0)
Eosinophils Absolute: 0.2 10*3/uL (ref 0.0–0.7)
Eosinophils Relative: 2.6 % (ref 0.0–5.0)
HCT: 39.1 % (ref 36.0–46.0)
Hemoglobin: 13.3 g/dL (ref 12.0–15.0)
Lymphocytes Relative: 34.2 % (ref 12.0–46.0)
Lymphs Abs: 2.1 10*3/uL (ref 0.7–4.0)
MCHC: 33.9 g/dL (ref 30.0–36.0)
MCV: 90.7 fl (ref 78.0–100.0)
Monocytes Absolute: 0.4 10*3/uL (ref 0.1–1.0)
Monocytes Relative: 6.9 % (ref 3.0–12.0)
Neutro Abs: 3.3 10*3/uL (ref 1.4–7.7)
Neutrophils Relative %: 55.2 % (ref 43.0–77.0)
Platelets: 215 10*3/uL (ref 150.0–400.0)
RBC: 4.31 Mil/uL (ref 3.87–5.11)
RDW: 13.1 % (ref 11.5–15.5)
WBC: 6 10*3/uL (ref 4.0–10.5)

## 2022-09-08 LAB — COMPREHENSIVE METABOLIC PANEL
ALT: 17 U/L (ref 0–35)
AST: 24 U/L (ref 0–37)
Albumin: 4.4 g/dL (ref 3.5–5.2)
Alkaline Phosphatase: 86 U/L (ref 39–117)
BUN: 12 mg/dL (ref 6–23)
CO2: 32 mEq/L (ref 19–32)
Calcium: 9.7 mg/dL (ref 8.4–10.5)
Chloride: 105 mEq/L (ref 96–112)
Creatinine, Ser: 0.71 mg/dL (ref 0.40–1.20)
GFR: 82.39 mL/min (ref >60.00)
Glucose, Bld: 102 mg/dL — ABNORMAL HIGH (ref 70–99)
Potassium: 4.9 mEq/L (ref 3.5–5.1)
Sodium: 142 mEq/L (ref 135–145)
Total Bilirubin: 0.4 mg/dL (ref 0.2–1.2)
Total Protein: 7.1 g/dL (ref 6.0–8.3)

## 2022-09-08 LAB — LIPASE: Lipase: 87 U/L — ABNORMAL HIGH (ref 11.0–59.0)

## 2022-09-08 MED ORDER — LACTASE 9000 UNITS PO TABS: 1.0000 | ORAL_TABLET | Freq: Two times a day (BID) | ORAL | 0 refills | Status: AC | PRN

## 2022-09-08 NOTE — Patient Instructions (Addendum)
Labs today.  I recommend clear liquid diet for 1-2 days (ginger ale, chicken broth, gatorade, jello) to give bowels rest. Let us know if ongoing abdominal pain.  Donepezil (Aricept) medicine can cause nausea and diarrhea - will need to keep an eye and if ongoing symptoms, we may need to try dropping dose of medicine.  Good to see you today

## 2022-09-08 NOTE — Progress Notes (Signed)
Patient ID: Beth Bailey, female    DOB: 1946/04/10, 76 y.o.   MRN: [id number]  This visit was conducted in person.  BP (!) 144/72   Pulse 63   Temp (!) 97.3 F (36.3 C) (Temporal)   Ht '4\' 10"'$  (1.473 m)   Wt 121 lb 3.2 oz (55 kg)   SpO2 95%   BMI 25.33 kg/m    CC: abd pain  Subjective:   HPI: Beth Bailey is a 76 y.o. female presenting on 09/08/2022 for Abdominal Pain (Here for f/u.  Sxs improved but still having some discomfort.)   Several weeks of upper and lower abdominal discomfort and cramping associated with loose stools several times a day. She treated with bowel rest with improvement in symptoms.  Niece was recently sick with diverticulitis flare.   H/o difficulty tolerating daily products, she did have ice cream before she started feeling ill. She takes lactase as needed for this.   No fevers/chills, nausea/vomiting or constipation, blood in stool, urine symptoms of dysuria, frequency or hematuria.  No dysphagia, heartburn, indigestion.  She continues omeprazole '20mg'$  daily.   Had ER evaluation 06/2022 for lower abd pain with diarrhea, normal evaluation including CT abd/pelvis.   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  Daughter has been managing MyChart - she may ask best friend's daughter to help as well.      Relevant past medical, surgical, family and social history reviewed and updated as indicated. Interim medical history since our last visit reviewed. Allergies and medications reviewed and updated. Outpatient Medications Prior to Visit  Medication Sig Dispense Refill   atorvastatin (LIPITOR) 40 MG tablet TAKE 1 TABLET BY MOUTH DAILY AT 6 PM. 90 tablet 3   Biotin 5000 MCG CAPS Take 5,000 mcg by mouth 2 (two) times a week. As needed     Calcium Carbonate (CALCIUM 600 PO) Take 1 tablet by mouth daily.     cetirizine (ZYRTEC) 10 MG tablet Take 10 mg by mouth at bedtime.       Cholecalciferol (VITAMIN D3) 25 MCG (1000 UT) CAPS Take 1 capsule (1,000 Units total) by mouth daily. 30 capsule    cyclobenzaprine (FLEXERIL) 10 MG tablet Take 1 tablet (10 mg total) by mouth 3 (three) times daily as needed for muscle spasms.     donepezil (ARICEPT) 10 MG tablet Take 1 tablet (10 mg total) by mouth at bedtime. 30 tablet 11   gabapentin (NEURONTIN) 300 MG capsule Take 1 capsule (300 mg total) by mouth 2 (two) times daily. 180 capsule 3   HYDROcodone-acetaminophen (NORCO/VICODIN) 5-325 MG tablet TAKE 1 TABLET BY MOUTH EVERY 6 HOURS AS NEEDED FOR PAIN. 30 tablet 0   losartan (COZAAR) 50 MG tablet Take 1 tablet (50 mg total) by mouth daily. 90 tablet 3   loteprednol (LOTEMAX) 0.5 % ophthalmic suspension 1 drop. At bedtime     METAMUCIL FIBER PO Take by mouth as needed.     MISC NATURAL PRODUCTS PO Take by mouth as needed. Fast Acting Dairy Digestive supplement     mometasone (ELOCON) 0.1 % cream 1-2 times daily to affected areas as needed for itch/bug bites. Avoid applying to face, groin, and axilla. Use as directed. Long-term use can cause thinning of the skin. 45 g 0   Omega-3 Fatty Acids (FISH OIL PO) Take 1 capsule by mouth daily. OmegaXL - 2 capsule daily     omeprazole (PRILOSEC) 20 MG capsule  Take 1 capsule (20 mg total) by mouth daily. 90 capsule 1   Polyethyl Glycol-Propyl Glycol 0.4-0.3 % SOLN Place 1 drop into both eyes 3 (three) times daily as needed (for dry eyes).     sertraline (ZOLOFT) 100 MG tablet Take 1 tablet (100 mg total) by mouth daily. 90 tablet 3   valACYclovir (VALTREX) 1000 MG tablet Take 1 tablet (1,000 mg total) by mouth at bedtime. 90 tablet 3   No facility-administered medications prior to visit.     Per HPI unless specifically indicated in ROS section below Review of Systems  Objective:  BP (!) 144/72   Pulse 63   Temp (!) 97.3 F (36.3 C) (Temporal)   Ht '4\' 10"'$  (1.473 m)   Wt 121 lb 3.2 oz (55 kg)   SpO2 95%   BMI 25.33 kg/m   Wt Readings  from Last 3 Encounters:  09/08/22 121 lb 3.2 oz (55 kg)  08/13/22 124 lb 6 oz (56.4 kg)  05/12/22 124 lb 4 oz (56.4 kg)      Physical Exam Vitals and nursing note reviewed.  Constitutional:      Appearance: She is well-developed. She is not ill-appearing.  HENT:     Head: Normocephalic and atraumatic.     Mouth/Throat:     Mouth: Mucous membranes are moist.     Pharynx: Oropharynx is clear. No oropharyngeal exudate or posterior oropharyngeal erythema.  Eyes:     Extraocular Movements: Extraocular movements intact.     Pupils: Pupils are equal, round, and reactive to light.  Cardiovascular:     Rate and Rhythm: Normal rate and regular rhythm.     Pulses: Normal pulses.     Heart sounds: Normal heart sounds. No murmur heard. Pulmonary:     Effort: Pulmonary effort is normal. No respiratory distress.     Breath sounds: Normal breath sounds. No wheezing, rhonchi or rales.  Abdominal:     General: Bowel sounds are increased. There is no distension.     Palpations: Abdomen is soft. There is no mass.     Tenderness: There is abdominal tenderness (mod) in the left lower quadrant. There is no right CVA tenderness, left CVA tenderness, guarding or rebound. Negative signs include Murphy's sign.     Hernia: No hernia is present. There is no hernia in the umbilical area.  Skin:    General: Skin is warm and dry.     Findings: No rash.  Neurological:     Mental Status: She is alert.  Psychiatric:        Mood and Affect: Mood normal.        Behavior: Behavior normal.       Results for orders placed or performed in visit on 08/13/22  POCT glycosylated hemoglobin (Hb A1C)  Result Value Ref Range   Hemoglobin A1C 6.4 (A) 4.0 - 5.6 %   HbA1c POC (<> result, manual entry)     HbA1c, POC (prediabetic range)     HbA1c, POC (controlled diabetic range)     Assessment & Plan:   Problem List Items Addressed This Visit       Unprioritized   Amnestic MCI (mild cognitive impairment with  memory loss)    Overall stable period on aricept '10mg'$  nightly, she feels this is beneficial.  GI symptoms precede aricept commencement, but may have worsened on medication.  See below.       LLQ abdominal pain - Primary    Off and on LLQ abd  discomfort in h/o diverticulosis on prior colonoscopy.  No fever but she has noted looser stools recently.  Check CBC, CMP, lipase, low threshold to start abx.  She did have reassuring CT a few months ago after being seen at ER for similar abd pain.  Fortunately symptoms seem to be improving.  Will need to consider Aricept as contributor to current symptoms (common side effects are nausea, vomiting, diarrhea).       Relevant Orders   Comprehensive metabolic panel   CBC with Differential/Platelet   Lipase   Other Visit Diagnoses     Loose stools            Meds ordered this encounter  Medications   Lactase 9000 units TABS    Sig: Take 1 tablet (9,000 Units total) by mouth 2 (two) times daily as needed.    Refill:  0   Orders Placed This Encounter  Procedures   Comprehensive metabolic panel   CBC with Differential/Platelet   Lipase     Patient Instructions  Labs today.  I recommend clear liquid diet for 1-2 days (ginger ale, chicken broth, gatorade, jello) to give bowels rest. Let us know if ongoing abdominal pain.  Donepezil (Aricept) medicine can cause nausea and diarrhea - will need to keep an eye and if ongoing symptoms, we may need to try dropping dose of medicine.  Good to see you today   Follow up plan: Return if symptoms worsen or fail to improve.  Ria Bush, MD

## 2022-09-08 NOTE — Assessment & Plan Note (Signed)
Off and on LLQ abd discomfort in h/o diverticulosis on prior colonoscopy.  No fever but she has noted looser stools recently.  Check CBC, CMP, lipase, low threshold to start abx.  She did have reassuring CT a few months ago after being seen at ER for similar abd pain.  Fortunately symptoms seem to be improving.  Will need to consider Aricept as contributor to current symptoms (common side effects are nausea, vomiting, diarrhea).

## 2022-09-08 NOTE — Assessment & Plan Note (Addendum)
Overall stable period on aricept '10mg'$  nightly, she feels this is beneficial.  GI symptoms precede aricept commencement, but may have worsened on medication.  See below.

## 2022-09-08 NOTE — Telephone Encounter (Signed)
Patient would like for her CT scan to be done at a [place] location instead of [facility]. Thank you!

## 2022-09-09 NOTE — Addendum Note (Signed)
Addended by: Ria Bush on: 09/09/2022 12:54 PM   Modules accepted: Orders

## 2022-09-09 NOTE — Telephone Encounter (Signed)
Noted  

## 2022-09-09 NOTE — Telephone Encounter (Addendum)
CT chest nodule f/u order changed to [place] location.

## 2022-09-14 ENCOUNTER — Other Ambulatory Visit: Payer: Self-pay | Admitting: Family Medicine

## 2022-09-14 DIAGNOSIS — Z1231 Encounter for screening mammogram for malignant neoplasm of breast: Secondary | ICD-10-CM

## 2022-09-15 ENCOUNTER — Ambulatory Visit
Admission: RE | Admit: 2022-09-15 | Discharge: 2022-09-15 | Disposition: A | Discharge status: Home / Self Care | Payer: Medicare HMO | Source: Ambulatory Visit | Attending: Family Medicine | Admitting: Family Medicine

## 2022-09-15 DIAGNOSIS — Z1231 Encounter for screening mammogram for malignant neoplasm of breast: Secondary | ICD-10-CM

## 2022-10-01 IMAGING — MG MM DIGITAL SCREENING BILAT W/ TOMO AND CAD
8 series · 8 of 24 positions shown · non-contrast
Comparison: Previous exam(s).

CLINICAL DATA: Screening.

EXAM:
DIGITAL SCREENING BILATERAL MAMMOGRAM WITH TOMOSYNTHESIS AND CAD
TECHNIQUE: Bilateral screening digital craniocaudal and mediolateral oblique
mammograms were obtained. Bilateral screening digital breast
tomosynthesis was performed. The images were evaluated with
computer-aided detection.

[L CC synth-2D]
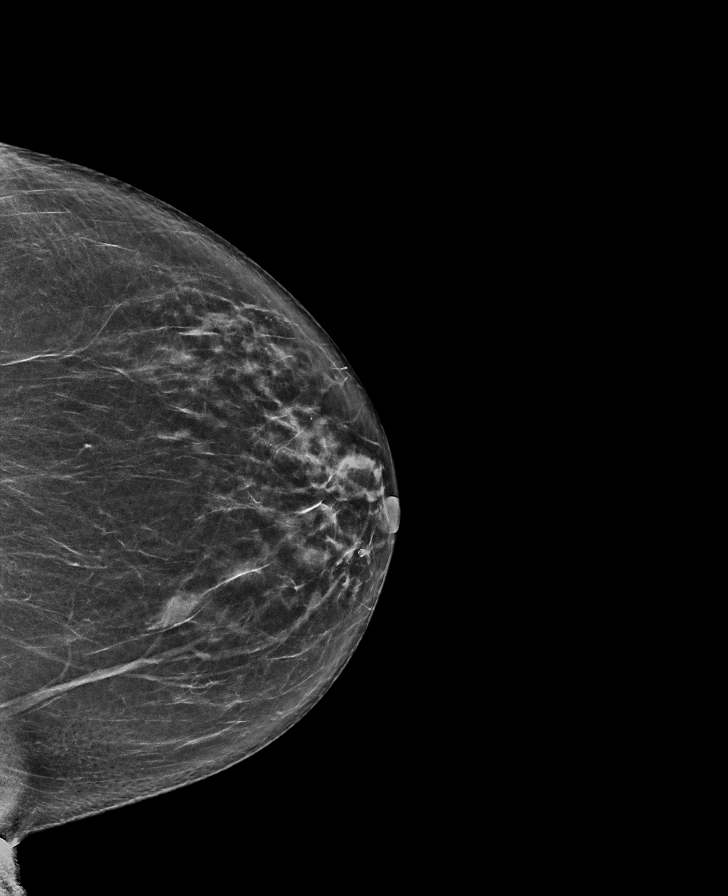

[L MLO synth-2D]
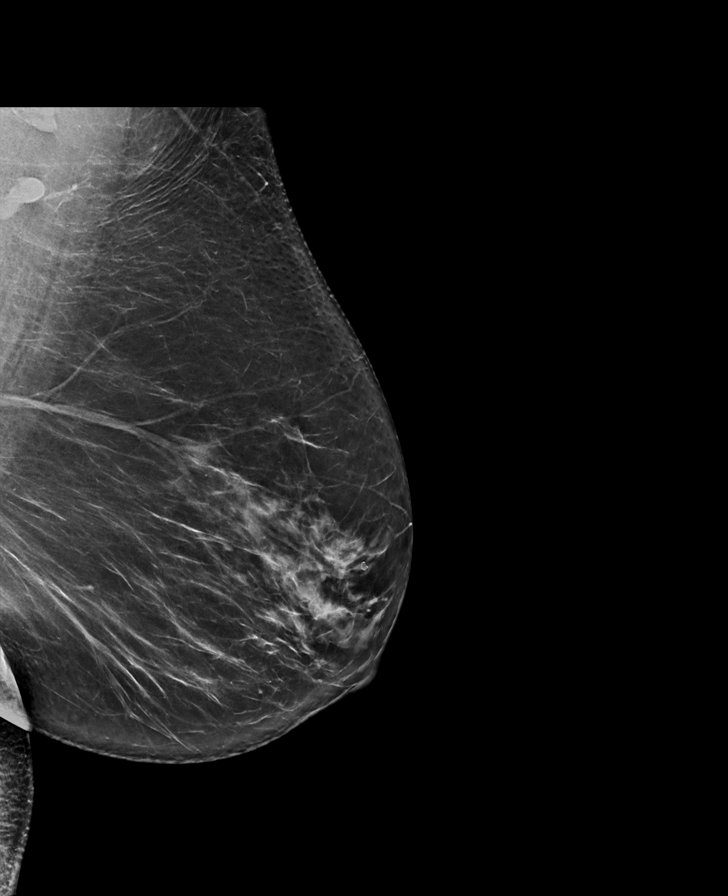

[R MLO synth-2D]
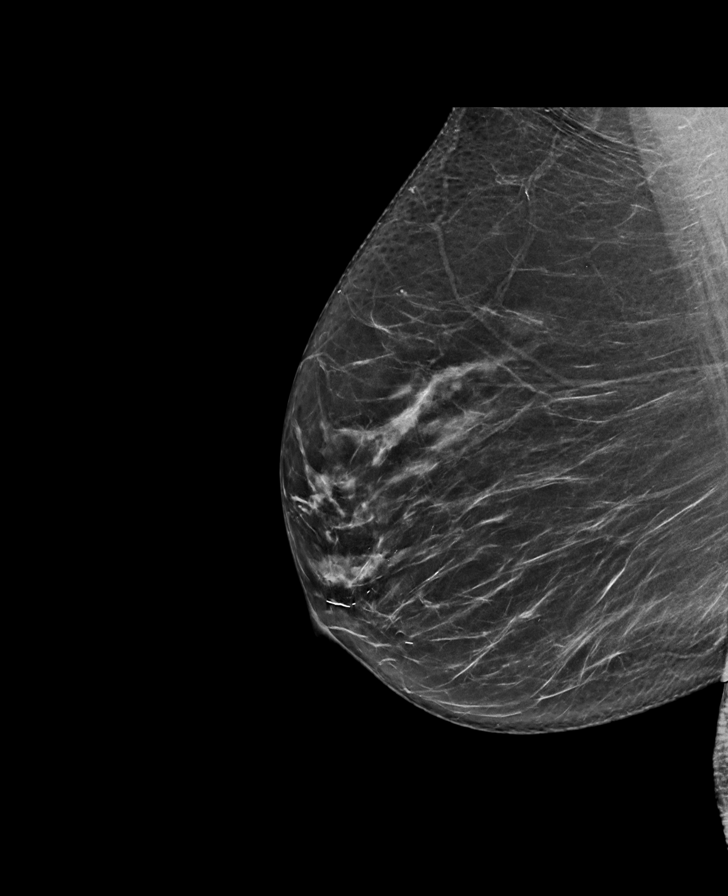

[R CC synth-2D]
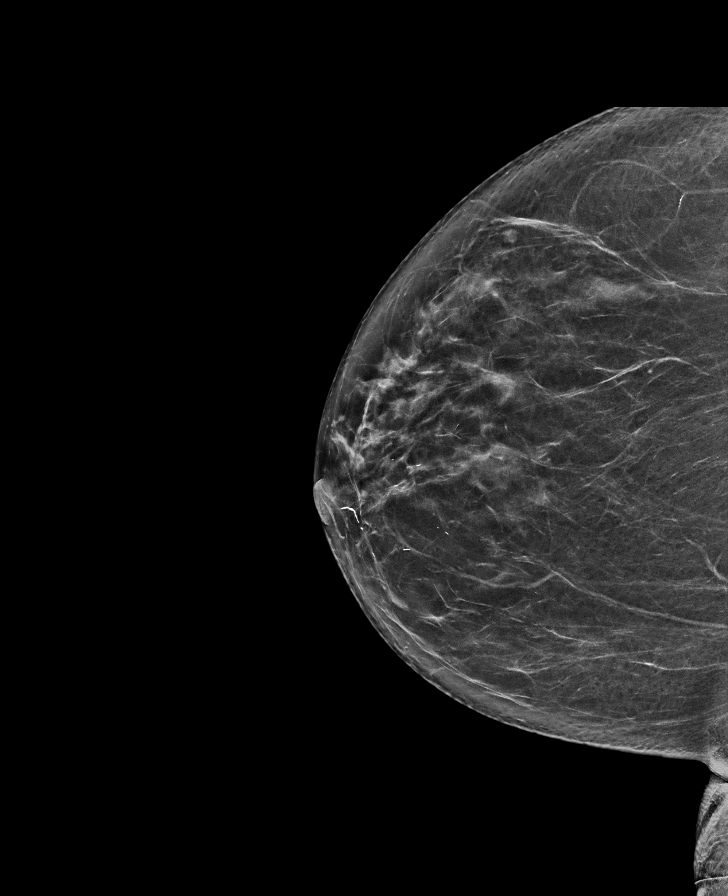

[L MLO tomo · tomo slice 39/78.0]
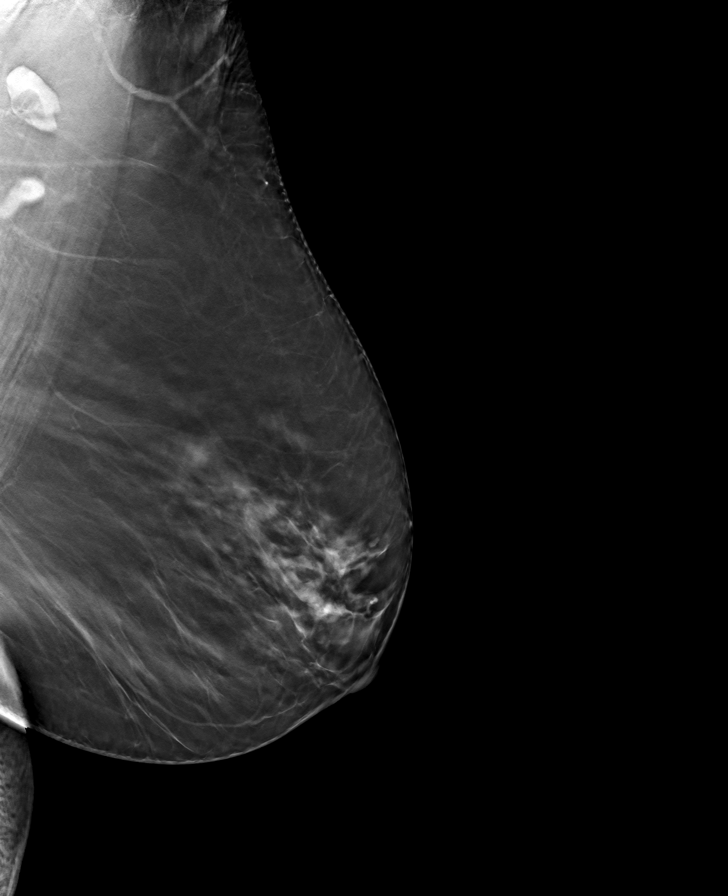

[R CC tomo · tomo slice 35/68.0]
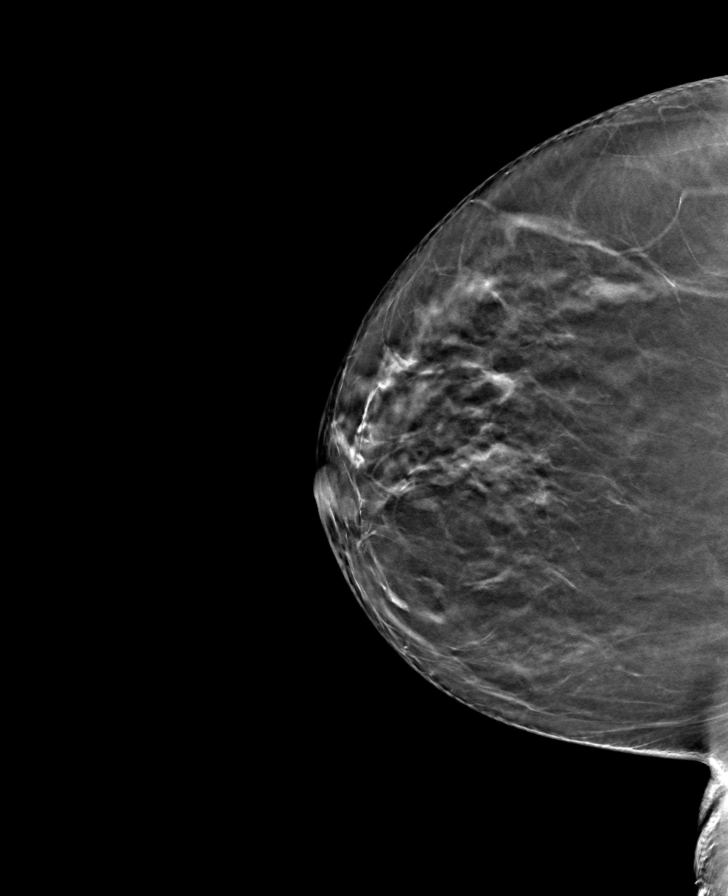

[R MLO tomo · tomo slice 37/74.0]
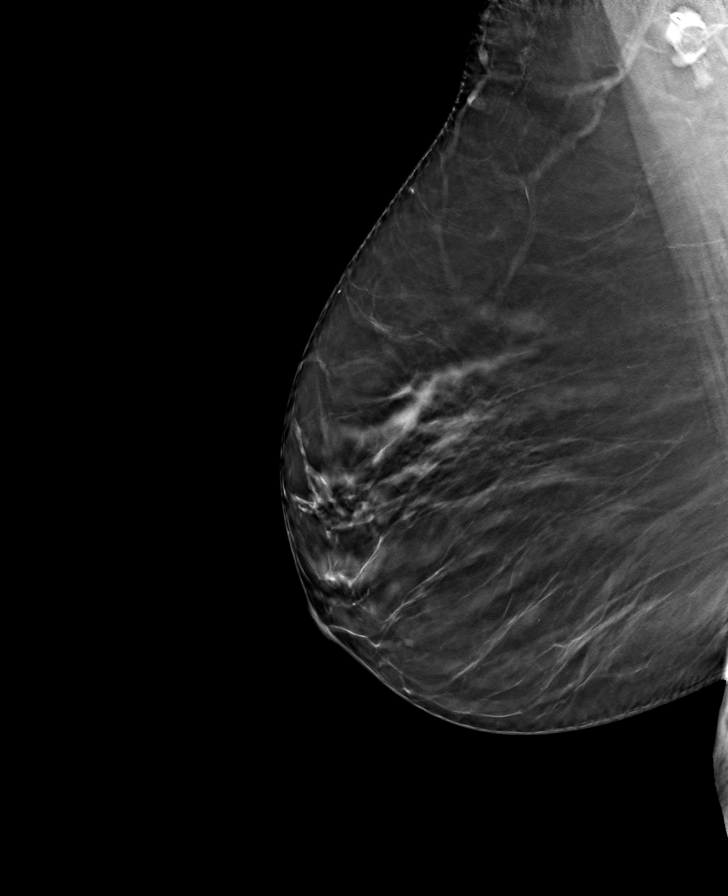

[L CC tomo · tomo slice 34/67.0]
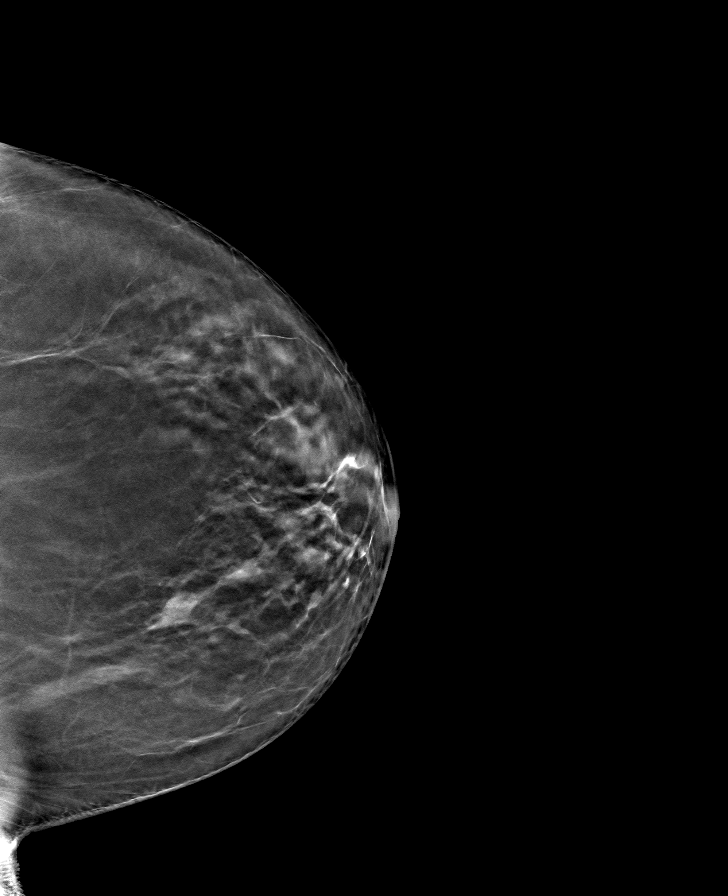

[8 of 24 positions shown; findings below may reference images not displayed]

ACR Breast Density Category b: There are scattered areas of
fibroglandular density.
FINDINGS: There are no findings suspicious for malignancy.
IMPRESSION: No mammographic evidence of malignancy. A result letter of this
screening mammogram will be mailed directly to the patient.

RECOMMENDATION:
Screening mammogram in one year. (Code:51-O-LD2)

BI-RADS CATEGORY  1: Negative.

## 2022-10-12 ENCOUNTER — Telehealth: Payer: Self-pay | Admitting: Family Medicine

## 2022-10-12 ENCOUNTER — Ambulatory Visit
Admission: RE | Admit: 2022-10-12 | Discharge: 2022-10-12 | Disposition: A | Discharge status: Home / Self Care | Payer: Medicare HMO | Source: Ambulatory Visit | Attending: Family Medicine | Admitting: Family Medicine

## 2022-10-12 DIAGNOSIS — R911 Solitary pulmonary nodule: Secondary | ICD-10-CM

## 2022-10-12 NOTE — Telephone Encounter (Signed)
Pt called in to know status of referral

## 2022-10-12 NOTE — Telephone Encounter (Signed)
Patient daughter called in and stated that she has an appointment with [facility] this morning at 11:20. They are needing her insurance information to be sent over to them in order to keep her appointment. Thank you!

## 2022-10-12 NOTE — Telephone Encounter (Signed)
Spoke to patient's daughter by telephone and was advised that her mom got a call this morning and needs information regarding her insurance. Patient's daughter stated that she is unsure what they need and not sure if her mom should go for her appointment or not. Patient's daughter stated that our office made the appointment.

## 2022-10-12 NOTE — Telephone Encounter (Signed)
Please reach out to [facility] imaging and find out what they need.

## 2022-10-12 NOTE — Telephone Encounter (Signed)
This was taken care of.  Her Authorization for the CT (appt 10/12/22 at 11:20) needed to be updated as it has expired 09/30/2022, also the location needed to be changed from [facility] to GI.   See referral order notes. Auth was updated and patient and [place] notified. Pt was seen and scanned this morning.

## 2022-10-22 ENCOUNTER — Other Ambulatory Visit: Payer: Self-pay | Admitting: Family Medicine

## 2022-10-25 NOTE — Telephone Encounter (Signed)
Strength increased to 10 mg (see 08/13/22 OV notes). New rx sent on 08/13/22, #30/11 to [facility].   Request denied.

## 2022-10-29 ENCOUNTER — Other Ambulatory Visit: Payer: Self-pay | Admitting: Family Medicine

## 2022-11-16 ENCOUNTER — Ambulatory Visit (INDEPENDENT_AMBULATORY_CARE_PROVIDER_SITE_OTHER): Payer: Medicare HMO | Admitting: Family

## 2022-11-16 ENCOUNTER — Encounter: Payer: Self-pay | Admitting: Family

## 2022-11-16 ENCOUNTER — Ambulatory Visit (INDEPENDENT_AMBULATORY_CARE_PROVIDER_SITE_OTHER)
Admission: RE | Admit: 2022-11-16 | Discharge: 2022-11-16 | Disposition: A | Discharge status: Home / Self Care | Payer: Medicare HMO | Source: Ambulatory Visit | Attending: Family | Admitting: Family

## 2022-11-16 VITALS — BP 128/64 | HR 78 | Temp 98.4°F | Ht <= 58 in | Wt 122.4 lb

## 2022-11-16 DIAGNOSIS — M25511 Pain in right shoulder: Secondary | ICD-10-CM | POA: Diagnosis not present

## 2022-11-16 DIAGNOSIS — S4991XA Unspecified injury of right shoulder and upper arm, initial encounter: Secondary | ICD-10-CM | POA: Diagnosis not present

## 2022-11-16 DIAGNOSIS — S6991XA Unspecified injury of right wrist, hand and finger(s), initial encounter: Secondary | ICD-10-CM | POA: Diagnosis not present

## 2022-11-16 NOTE — Assessment & Plan Note (Signed)
Xray right wrist Work on exercises, sent to [facility] to site, analgesics prn

## 2022-11-16 NOTE — Progress Notes (Signed)
Established Patient Office Visit  Subjective:   Patient ID: Beth Bailey, female    DOB: 1946/02/04  Age: 77 y.o. MRN: [id number]  CC:  Chief Complaint  Patient presents with   Shoulder Pain    HPI: Beth Bailey is a 77 y.o. female presenting on 11/16/2022 for Shoulder Pain     Shoulder Pain     Started with right shoulder pain after carrying cat litter in the woods to dump out. When she was about to dump it out she slipped and landed on her right shoulder. She feels as though she landed on a branch. She has the pain under the skin. Pain increased with movement.   Also landed a bit on her right wrist with some limited mobility due to pain as well.  Improving slightly but still with pain with flexion and gripping anything.   Taking ibuprofen/tylenol with mild relief.         ROS: Negative unless specifically indicated above in HPI.   Relevant past medical history reviewed and updated as indicated.   Allergies and medications reviewed and updated.   Current Outpatient Medications:    atorvastatin (LIPITOR) 40 MG tablet, TAKE 1 TABLET BY MOUTH DAILY AT 6 PM., Disp: 90 tablet, Rfl: 3   Biotin 5000 MCG CAPS, Take 5,000 mcg by mouth 2 (two) times a week. As needed, Disp: , Rfl:    Calcium Carbonate (CALCIUM 600 PO), Take 1 tablet by mouth daily., Disp: , Rfl:    cetirizine (ZYRTEC) 10 MG tablet, Take 10 mg by mouth at bedtime. , Disp: , Rfl:    Cholecalciferol (VITAMIN D3) 25 MCG (1000 UT) CAPS, Take 1 capsule (1,000 Units total) by mouth daily., Disp: 30 capsule, Rfl:    cyclobenzaprine (FLEXERIL) 10 MG tablet, Take 1 tablet (10 mg total) by mouth 3 (three) times daily as needed for muscle spasms., Disp: , Rfl:    donepezil (ARICEPT) 10 MG tablet, Take 1 tablet (10 mg total) by mouth at bedtime., Disp: 30 tablet, Rfl: 11   fluorometholone (FML) 0.1 % ophthalmic suspension, Place 1 drop into the right eye daily., Disp: , Rfl:    gabapentin (NEURONTIN)  300 MG capsule, Take 1 capsule (300 mg total) by mouth 2 (two) times daily., Disp: 180 capsule, Rfl: 3   HYDROcodone-acetaminophen (NORCO/VICODIN) 5-325 MG tablet, TAKE 1 TABLET BY MOUTH EVERY 6 HOURS AS NEEDED FOR PAIN., Disp: 30 tablet, Rfl: 0   Lactase 9000 units TABS, Take 1 tablet (9,000 Units total) by mouth 2 (two) times daily as needed., Disp: , Rfl: 0   losartan (COZAAR) 50 MG tablet, Take 1 tablet (50 mg total) by mouth daily., Disp: 90 tablet, Rfl: 3   METAMUCIL FIBER PO, Take by mouth as needed., Disp: , Rfl:    MISC NATURAL PRODUCTS PO, Take by mouth as needed. Fast Acting Dairy Digestive supplement, Disp: , Rfl:    Omega-3 Fatty Acids (FISH OIL PO), Take 1 capsule by mouth daily. OmegaXL - 2 capsule daily, Disp: , Rfl:    omeprazole (PRILOSEC) 20 MG capsule, Take 1 capsule (20 mg total) by mouth daily., Disp: 90 capsule, Rfl: 1   Polyethyl Glycol-Propyl Glycol 0.4-0.3 % SOLN, Place 1 drop into both eyes 3 (three) times daily as needed (for dry eyes)., Disp: , Rfl:    sertraline (ZOLOFT) 100 MG tablet, Take 1 tablet (100 mg total) by mouth daily., Disp: 90 tablet, Rfl: 3   valACYclovir (VALTREX) 1000 MG tablet, Take 1 tablet (  1,000 mg total) by mouth at bedtime., Disp: 90 tablet, Rfl: 3  Allergies  Allergen Reactions   Amoxicillin Other (See Comments)    Pt does not remember what the reaction was   Atorvastatin Other (See Comments)    myalgia   Diclofenac Sodium Swelling   Erythromycin Swelling   Penicillins Hives    Has patient had a PCN reaction causing immediate rash, facial/tongue/throat swelling, SOB or lightheadedness with hypotension: Yes Has patient had a PCN reaction causing severe rash involving mucus membranes or skin necrosis: No Has patient had a PCN reaction that required hospitalization No Has patient had a PCN reaction occurring within the last 10 years: No If all of the above answers are "NO", then may proceed with Cephalosporin use. Other reaction(s): Unknown,  Unknown Unknown, rash possibly    Pravastatin Other (See Comments)    myalgia   Simvastatin Other (See Comments)    muscle pain   Statins Other (See Comments)    Myalgias to simvastatin, pravastatin, lovastatin, atorvastatin   Trifluridine Swelling   Latex Rash   Lisinopril Rash and Other (See Comments)    Hyperkalemia and achy bones also    Objective:   BP 128/64   Pulse 78   Temp 98.4 F (36.9 C) (Temporal)   Ht '4\' 10"'$  (1.473 m)   Wt 122 lb 6.4 oz (55.5 kg)   SpO2 98%   BMI 25.58 kg/m    Physical Exam Constitutional:      General: She is not in acute distress.    Appearance: Normal appearance. She is normal weight. She is not ill-appearing, toxic-appearing or diaphoretic.  HENT:     Head: Normocephalic.  Cardiovascular:     Rate and Rhythm: Normal rate.  Pulmonary:     Effort: Pulmonary effort is normal.  Musculoskeletal:     Right shoulder: Effusion (with bruising , moderate to severe etenderness on palpation right anterior shoulder/arm) and tenderness present. No swelling. Decreased range of motion (pain with extension and external rotation). Decreased strength.     Right wrist: Swelling (slight anterior base of wrist), bony tenderness (right anterior medial bone of wrist) and snuff box tenderness present. Decreased range of motion (pain with flexion).     Comments: + apley  + drop can right shoulder  Neurological:     General: No focal deficit present.     Mental Status: She is alert and oriented to person, place, and time. Mental status is at baseline.  Psychiatric:        Mood and Affect: Mood normal.        Behavior: Behavior normal.        Thought Content: Thought content normal.        Judgment: Judgment normal.     Assessment & Plan:  Right shoulder injury, initial encounter Assessment & Plan: Xray right shoulder Work on exercises, sent to [facility] to site, analgesics prn   Orders: -     DG Shoulder Right; Future  Injury of right wrist,  initial encounter Assessment & Plan: Xray right wrist Work on exercises, sent to [facility] to site, analgesics prn   Orders: -     DG Wrist 2 Views Right; Future     Follow up plan: Return for f/u with primary care provider if no improvement.  Eugenia Pancoast, FNP

## 2022-11-16 NOTE — Assessment & Plan Note (Signed)
Xray right shoulder Work on exercises, sent to [facility] to site, analgesics prn

## 2022-11-16 NOTE — Patient Instructions (Signed)
  Recommend over the counter voltaren gel.  Ice to the site.  Complete xray(s) prior to leaving today. I will notify you of your results once received.   Regards,   Eugenia Pancoast FNP-C

## 2022-11-17 ENCOUNTER — Other Ambulatory Visit: Payer: Self-pay | Admitting: Family

## 2022-11-17 DIAGNOSIS — S4991XA Unspecified injury of right shoulder and upper arm, initial encounter: Secondary | ICD-10-CM

## 2022-11-17 NOTE — Progress Notes (Signed)
Xray was negative for acute findings. Due to nature of symptoms, recommend referral to orthopedist. Referral placed. If pt does not hear anything in -2 weeks please let me know. (Pt has requested we call via utilize mychart)

## 2022-11-18 NOTE — Progress Notes (Signed)
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 When the flare up occurs ok to take ibuprofen for the swelling, tenderness. Warm heat to them as well. No acute fractures or findings.

## 2022-11-29 ENCOUNTER — Ambulatory Visit (INDEPENDENT_AMBULATORY_CARE_PROVIDER_SITE_OTHER): Payer: Medicare HMO | Admitting: Orthopedic Surgery

## 2022-11-29 DIAGNOSIS — M25511 Pain in right shoulder: Secondary | ICD-10-CM | POA: Diagnosis not present

## 2022-12-08 ENCOUNTER — Encounter: Payer: Self-pay | Admitting: Orthopedic Surgery

## 2022-12-08 DIAGNOSIS — M25511 Pain in right shoulder: Secondary | ICD-10-CM | POA: Diagnosis not present

## 2022-12-08 MED ORDER — LIDOCAINE HCL 1 % IJ SOLN
5.0000 mL | INTRAMUSCULAR | Status: AC | PRN
  Administered 2022-12-08: 5 mL

## 2022-12-08 MED ORDER — METHYLPREDNISOLONE ACETATE 40 MG/ML IJ SUSP
40.0000 mg | INTRAMUSCULAR | Status: AC | PRN
  Administered 2022-12-08: 40 mg via INTRA_ARTICULAR

## 2022-12-08 NOTE — Progress Notes (Signed)
Office Visit Note   Patient: Beth Bailey           Date of Birth: [birth year]           MRN: [id number] Visit Date: 11/29/2022              Requested by: Eugenia Pancoast, [place],  [place] [id number] PCP: Ria Bush, MD  Chief Complaint  Patient presents with   Right Shoulder - Injury      HPI: Patient is a 77 year old woman who presents for acute right shoulder pain.  She states she lost her balance and hit her shoulder on the wall..  X-rays were obtained on February 6.  Patient states her shoulder is very sore and tender.  She has been using ice.  Assessment & Plan: Visit Diagnoses:  1. Acute pain of right shoulder     Plan: Right shoulder was injected in the subacromial space.  Follow-Up Instructions: Return if symptoms worsen or fail to improve.   Ortho Exam  Patient is alert, oriented, no adenopathy, well-dressed, normal affect, normal respiratory effort. Patient has abduction and flexion to 90 degrees.  She has pain to palpation of the biceps tendon.  Pain with Neer and Hawkins impingement test.  Examination of the right wrist patient has tenderness to palpation over the carpal metacarpal joint of her thumb consistent with osteoarthritis.  Radiograph shows osteoporosis.  Examination of the radiographs of the right shoulder shows a congruent glenohumeral joint no superior migration there is good subacromial space.  No fractures.  Imaging: No results found. No images are attached to the encounter.  Labs: Lab Results  Component Value Date   HGBA1C 6.4 (A) 08/13/2022   HGBA1C 7.1 (H) 05/05/2022   HGBA1C 6.7 (H) 02/25/2021   REPTSTATUS 06/27/2022 FINAL 06/25/2022   CULT (A) 06/25/2022    <10,000 COLONIES/mL INSIGNIFICANT GROWTH Performed at [facility], [place] [address] [place], [place] [address]    LABORGA Insignificant Growth 11/26/2014     Lab Results  Component Value Date   ALBUMIN 4.4 09/08/2022   ALBUMIN  4.0 06/25/2022   ALBUMIN 4.6 05/05/2022    No results found for: "MG" Lab Results  Component Value Date   VD25OH 50.51 05/05/2022   VD25OH 33.70 11/26/2014    No results found for: "PREALBUMIN"    Latest Ref Rng & Units 09/08/2022   12:41 PM 06/25/2022    7:52 AM 05/11/2021   12:46 PM  CBC EXTENDED  WBC 4.0 - 10.5 K/uL 6.0  10.2  5.4   RBC 3.87 - 5.11 Mil/uL 4.31  4.19  4.27   Hemoglobin 12.0 - 15.0 g/dL 13.3  12.8  13.0   HCT 36.0 - 46.0 % 39.1  38.9  39.0   Platelets 150.0 - 400.0 K/uL 215.0  164  165.0   NEUT# 1.4 - 7.7 K/uL 3.3   3.1   Lymph# 0.7 - 4.0 K/uL 2.1   1.8      There is no height or weight on file to calculate BMI.  Orders:  No orders of the defined types were placed in this encounter.  No orders of the defined types were placed in this encounter.    Procedures: Large Joint Inj: R subacromial bursa on 12/08/2022 4:17 PM Indications: diagnostic evaluation and pain Details: 22 G 1.5 in needle, posterior approach  Arthrogram: No  Medications: 5 mL lidocaine 1 %; 40 mg methylPREDNISolone acetate 40 MG/ML Outcome: tolerated well,  no immediate complications Procedure, treatment alternatives, risks and benefits explained, specific risks discussed. Consent was given by the patient. Immediately prior to procedure a time out was called to verify the correct patient, procedure, equipment, support staff and site/side marked as required. Patient was prepped and draped in the usual sterile fashion.      Clinical Data: No additional findings.  ROS:  All other systems negative, except as noted in the HPI. Review of Systems  Objective: Vital Signs: There were no vitals taken for this visit.  Specialty Comments:  No specialty comments available.  PMFS History: Patient Active Problem List   Diagnosis Date Noted   Injury of right wrist 11/16/2022   Right shoulder injury, initial encounter 11/16/2022   Amnestic MCI (mild cognitive impairment with memory  loss) 05/13/2022   Chronic low back pain 05/13/2022   Neck pain on right side 02/09/2022   Calcium pyrophosphate deposition disease (CPPD) 07/14/2021   Statin intolerance 01/12/2020   Pulmonary nodule, right 04/13/2019   History of malignant melanoma of skin 03/29/2018   Carotid stenosis 06/19/2016   Encounter for general adult medical examination with abnormal findings 05/31/2016   Advanced care planning/counseling discussion 12/02/2014   Lesion of spleen    Depressed mood 12/18/2013   NAFLD (nonalcoholic fatty liver disease) 09/30/2013   Medicare annual wellness visit, subsequent 09/25/2013   Osteopenia    IBS (irritable bowel syndrome) 08/29/2013   Internal hemorrhoids with Grade 2 prolapse, itching, bleeding and soiling 08/29/2013   Familial hyperlipidemia, high LDL    History of colon polyps    Herpes simplex of eye    HNP (herniated nucleus pulposus), lumbar 12/07/2012   Controlled diabetes mellitus type 2 with complications ([place]) [id number]   Essential hypertension 02/23/2007   GERD 02/23/2007   Osteoarthritis 02/23/2007   Past Medical History:  Diagnosis Date   Allergic rhinitis    prior on allergy shots   Anxiety    Arthritis    Carotid stenosis 06/19/2016   R <40%, L 40-59%, rpt 1 yr (06/2016)   Colon polyps    Community acquired pneumonia of right lower lobe of lung 01/09/2020   GERD (gastroesophageal reflux disease)    controlled with PPI   Herpes simplex of eye    right - blind s/p corneal transplant   History of colon polyps    History of hiatal hernia    History of melanoma 06/05/2013   Right back. MM, Superficial spreading. Clark's level II, Breslow's 0.62m. [facility]   HLD (hyperlipidemia)    intolerant of statins   HNP (herniated nucleus pulposus), lumbar 12/07/2012   s/p surgery   Hyperglycemia    pt is unsure about this   Hypertension    has taken herself off her medications    Lesion of spleen 2013   2cm, stable on  recheck   Malignant melanoma of right thigh ([place] 03/29/2018   breslow thickness 0.376m(Dr GrVeleta Miners  NAFLD (nonalcoholic fatty liver disease) 12/2013   by [place] Osteopenia 2016   dexa T -1.5 hip   Pneumonia     Family History  Problem Relation Age of Onset   Alzheimer's disease Father    Colon cancer Sister [id number] Colon polyps Sister    Cancer Maternal Uncle        questionable type   Heart attack Maternal Uncle    Kidney disease Maternal Uncle    Lung cancer Paternal Uncle    Diabetes Maternal Grandmother  Dementia Maternal Grandmother    Stroke Maternal Grandmother    Parkinson's disease Maternal Grandfather    Arthritis Other    Liver disease Neg Hx    Esophageal cancer Neg Hx    Stomach cancer Neg Hx    Rectal cancer Neg Hx     Past Surgical History:  Procedure Laterality Date   ABDOMINAL HYSTERECTOMY  10/12/1983   ovaries remain.  endometriosis, abnormal pap as well   CARPAL TUNNEL RELEASE Bilateral    CATARACT EXTRACTION Right 10/12/2011   CHOLECYSTECTOMY  10/11/2005   COLONOSCOPY  10/11/2010   polyp , mild divertic, int hem, rec rpt 5 yrs Carlean Purl)   COLONOSCOPY  04/2021   3 TAs, diverticulosis, rpt 3 yrs Carlean Purl)   CORNEAL TRANSPLANT Right 10/11/2010   dexa  10/11/2014   osteopenia T-1.5   LUMBAR LAMINECTOMY/DECOMPRESSION MICRODISCECTOMY Right 12/07/2012   Procedure: LUMBAR LAMINECTOMY/DECOMPRESSION MICRODISCECTOMY 1 LEVEL;  Surgeon: Winfield Cunas, MD; Laterality: Right;  Right lumbar five-sacral one diskectomy   NECK SURGERY  1990s   plate in neck   TONSILLECTOMY     Social History   Occupational History   Not on file  Tobacco Use   Smoking status: Never   Smokeless tobacco: Never  Vaping Use   Vaping Use: Never used  Substance and Sexual Activity   Alcohol use: No   Drug use: Yes    Types: Hydrocodone   Sexual activity: Not Currently    Birth control/protection: Post-menopausal

## 2022-12-21 ENCOUNTER — Ambulatory Visit: Payer: Medicare HMO | Admitting: Dermatology

## 2023-01-06 ENCOUNTER — Other Ambulatory Visit: Payer: Self-pay | Admitting: Family Medicine

## 2023-01-19 ENCOUNTER — Other Ambulatory Visit: Payer: Self-pay | Admitting: Family Medicine

## 2023-01-19 DIAGNOSIS — B0050 Herpesviral ocular disease, unspecified: Secondary | ICD-10-CM

## 2023-01-19 NOTE — Telephone Encounter (Signed)
Valtrex Last filled:  10/22/22, #90 Last OV:  09/08/22, abd pain Next OV:  none

## 2023-01-20 NOTE — Telephone Encounter (Signed)
ERx 

## 2023-03-08 DIAGNOSIS — Z947 Corneal transplant status: Secondary | ICD-10-CM | POA: Diagnosis not present

## 2023-03-08 DIAGNOSIS — H1045 Other chronic allergic conjunctivitis: Secondary | ICD-10-CM | POA: Diagnosis not present

## 2023-03-15 ENCOUNTER — Ambulatory Visit (INDEPENDENT_AMBULATORY_CARE_PROVIDER_SITE_OTHER): Payer: Medicare HMO | Admitting: Family Medicine

## 2023-03-15 ENCOUNTER — Encounter: Payer: Self-pay | Admitting: Family Medicine

## 2023-03-15 VITALS — BP 138/82 | HR 71 | Temp 97.6°F | Ht <= 58 in | Wt 121.0 lb

## 2023-03-15 DIAGNOSIS — L03114 Cellulitis of left upper limb: Secondary | ICD-10-CM | POA: Insufficient documentation

## 2023-03-15 DIAGNOSIS — L239 Allergic contact dermatitis, unspecified cause: Secondary | ICD-10-CM | POA: Diagnosis not present

## 2023-03-15 MED ORDER — DOXYCYCLINE HYCLATE 100 MG PO TABS: 100.0000 mg | ORAL_TABLET | Freq: Two times a day (BID) | ORAL | 0 refills | Status: DC

## 2023-03-15 MED ORDER — PREDNISONE 10 MG PO TABS: ORAL_TABLET | ORAL | 0 refills | Status: DC

## 2023-03-15 NOTE — Patient Instructions (Addendum)
Start zyrtec daily.  Complete prednisone and antibiotic course.  Call if redness is spreading, new fever or if unable to take antibiotics.  If severe symptoms go to ER.

## 2023-03-15 NOTE — Assessment & Plan Note (Signed)
Acute, likely insect bite reaction.  Significant pain and swelling in the left forearm concerning for bacterial superinfection. Treat allergic reaction with prednisone taper and oral antihistamine such as Zyrtec daily.  No red flags or evidence of airway compromise. Return and ER precautions provided

## 2023-03-15 NOTE — Assessment & Plan Note (Signed)
Acute, Cover for bacterial superinfection with likely staph.  Use doxycycline 100 mg p.o. twice daily given other antibiotic intolerance/allergies.  No known MRSA exposure.  Return and ER precautions provided.  Follow-up in 2 days with myself or PCP for reevaluation.

## 2023-03-15 NOTE — Progress Notes (Signed)
Patient ID: Beth Bailey, female    DOB: 1946/02/06, 77 y.o.   MRN: [id number]  This visit was conducted in person.  BP 138/82   Pulse 71   Temp 97.6 F (36.4 C) (Temporal)   Ht 4\' 10"  (1.473 m)   Wt 121 lb (54.9 kg)   SpO2 95%   BMI 25.29 kg/m    CC:  Chief Complaint  Patient presents with   Insect Bite    Ants left arm about a week ago.     Subjective:   HPI: Beth Bailey is a 77 y.o. female patient of Dr. Sharen Hones with history of diabetes, hypertension presenting on 03/15/2023 for Insect Bite (Ants left arm about a week ago. )  She noted 1 week ago... was outside trimming a bush... was around bugs.  Noted itchiness in left forearm. Bites on bilateral arms and legs.  Washed with warm water, she has beena pplying calamine lotion.  Has been aplying antibiotic oitnment.  Later has noted redness and swelling in left forearm... not sure  Skin is thickened.     Lab Results  Component Value Date   HGBA1C 6.4 (A) 08/13/2022    No history of boils, MRSA.  Relevant past medical, surgical, family and social history reviewed and updated as indicated. Interim medical history since our last visit reviewed. Allergies and medications reviewed and updated. Outpatient Medications Prior to Visit  Medication Sig Dispense Refill   atorvastatin (LIPITOR) 40 MG tablet TAKE 1 TABLET BY MOUTH DAILY AT 6 PM. 90 tablet 3   Biotin 5000 MCG CAPS Take 5,000 mcg by mouth 2 (two) times a week. As needed     Calcium Carbonate (CALCIUM 600 PO) Take 1 tablet by mouth daily.     cetirizine (ZYRTEC) 10 MG tablet Take 10 mg by mouth at bedtime.      Cholecalciferol (VITAMIN D3) 25 MCG (1000 UT) CAPS Take 1 capsule (1,000 Units total) by mouth daily. 30 capsule    cyclobenzaprine (FLEXERIL) 10 MG tablet Take 1 tablet (10 mg total) by mouth 3 (three) times daily as needed for muscle spasms.     donepezil (ARICEPT) 10 MG tablet Take 1 tablet (10 mg total) by mouth at bedtime. 30  tablet 11   fluorometholone (FML) 0.1 % ophthalmic suspension Place 1 drop into the right eye daily.     gabapentin (NEURONTIN) 300 MG capsule Take 1 capsule (300 mg total) by mouth 2 (two) times daily. 180 capsule 3   HYDROcodone-acetaminophen (NORCO/VICODIN) 5-325 MG tablet TAKE 1 TABLET BY MOUTH EVERY 6 HOURS AS NEEDED FOR PAIN. 30 tablet 0   Lactase 9000 units TABS Take 1 tablet (9,000 Units total) by mouth 2 (two) times daily as needed.  0   losartan (COZAAR) 50 MG tablet Take 1 tablet (50 mg total) by mouth daily. 90 tablet 3   METAMUCIL FIBER PO Take by mouth as needed.     MISC NATURAL PRODUCTS PO Take by mouth as needed. Fast Acting Dairy Digestive supplement     Omega-3 Fatty Acids (FISH OIL PO) Take 1 capsule by mouth daily. OmegaXL - 2 capsule daily     omeprazole (PRILOSEC) 20 MG capsule TAKE 1 CAPSULE BY MOUTH EVERY DAY 90 capsule 1   Polyethyl Glycol-Propyl Glycol 0.4-0.3 % SOLN Place 1 drop into both eyes 3 (three) times daily as needed (for dry eyes).     sertraline (ZOLOFT) 100 MG tablet Take 1 tablet (100 mg total) by mouth  daily. 90 tablet 3   valACYclovir (VALTREX) 1000 MG tablet TAKE 1 TABLET (1,000 MG TOTAL) BY MOUTH AT BEDTIME. 90 tablet 1   No facility-administered medications prior to visit.     Per HPI unless specifically indicated in ROS section below Review of Systems  Constitutional:  Negative for fatigue and fever.  HENT:  Negative for congestion.   Eyes:  Negative for pain.  Respiratory:  Negative for cough and shortness of breath.   Cardiovascular:  Negative for chest pain, palpitations and leg swelling.  Gastrointestinal:  Negative for abdominal pain.  Genitourinary:  Negative for dysuria and vaginal bleeding.  Musculoskeletal:  Negative for back pain.  Neurological:  Negative for syncope, light-headedness and headaches.  Psychiatric/Behavioral:  Negative for dysphoric mood.    Objective:  BP 138/82   Pulse 71   Temp 97.6 F (36.4 C) (Temporal)    Ht 4\' 10"  (1.473 m)   Wt 121 lb (54.9 kg)   SpO2 95%   BMI 25.29 kg/m   Wt Readings from Last 3 Encounters:  03/15/23 121 lb (54.9 kg)  11/16/22 122 lb 6.4 oz (55.5 kg)  09/08/22 121 lb 3.2 oz (55 kg)      Physical Exam Constitutional:      General: She is not in acute distress.    Appearance: Normal appearance. She is well-developed. She is not ill-appearing or toxic-appearing.  HENT:     Head: Normocephalic.     Right Ear: Hearing, tympanic membrane, ear canal and external ear normal. Tympanic membrane is not erythematous, retracted or bulging.     Left Ear: Hearing, tympanic membrane, ear canal and external ear normal. Tympanic membrane is not erythematous, retracted or bulging.     Nose: No mucosal edema or rhinorrhea.     Right Sinus: No maxillary sinus tenderness or frontal sinus tenderness.     Left Sinus: No maxillary sinus tenderness or frontal sinus tenderness.     Mouth/Throat:     Pharynx: Uvula midline.  Eyes:     General: Lids are normal. Lids are everted, no foreign bodies appreciated.     Conjunctiva/sclera: Conjunctivae normal.     Pupils: Pupils are equal, round, and reactive to light.  Neck:     Thyroid: No thyroid mass or thyromegaly.     Vascular: No carotid bruit.     Trachea: Trachea normal.  Cardiovascular:     Rate and Rhythm: Normal rate and regular rhythm.     Pulses: Normal pulses.     Heart sounds: Normal heart sounds, S1 normal and S2 normal. No murmur heard.    No friction rub. No gallop.  Pulmonary:     Effort: Pulmonary effort is normal. No tachypnea or respiratory distress.     Breath sounds: Normal breath sounds. No decreased breath sounds, wheezing, rhonchi or rales.  Abdominal:     General: Bowel sounds are normal.     Palpations: Abdomen is soft.     Tenderness: There is no abdominal tenderness.  Musculoskeletal:     Cervical back: Normal range of motion and neck supple.  Skin:    General: Skin is warm and dry.     Findings: Rash  present.     Comments:  Multiple excoriations on bilateral arms and lower legs, redness and swelling in left arm, sleep  Neurological:     Mental Status: She is alert.  Psychiatric:        Mood and Affect: Mood is not anxious or depressed.  Speech: Speech normal.        Behavior: Behavior normal. Behavior is cooperative.        Thought Content: Thought content normal.        Judgment: Judgment normal.       Results for orders placed or performed in visit on 09/08/22  Comprehensive metabolic panel  Result Value Ref Range   Sodium 142 135 - 145 mEq/L   Potassium 4.9 3.5 - 5.1 mEq/L   Chloride 105 96 - 112 mEq/L   CO2 32 19 - 32 mEq/L   Glucose, Bld 102 (H) 70 - 99 mg/dL   BUN 12 6 - 23 mg/dL   Creatinine, Ser 1.61 0.40 - 1.20 mg/dL   Total Bilirubin 0.4 0.2 - 1.2 mg/dL   Alkaline Phosphatase 86 39 - 117 U/L   AST 24 0 - 37 U/L   ALT 17 0 - 35 U/L   Total Protein 7.1 6.0 - 8.3 g/dL   Albumin 4.4 3.5 - 5.2 g/dL   GFR 09.60 >45.40 mL/min   Calcium 9.7 8.4 - 10.5 mg/dL  CBC with Differential/Platelet  Result Value Ref Range   WBC 6.0 4.0 - 10.5 K/uL   RBC 4.31 3.87 - 5.11 Mil/uL   Hemoglobin 13.3 12.0 - 15.0 g/dL   HCT 98.1 19.1 - 47.8 %   MCV 90.7 78.0 - 100.0 fl   MCHC 33.9 30.0 - 36.0 g/dL   RDW 29.5 62.1 - 30.8 %   Platelets 215.0 150.0 - 400.0 K/uL   Neutrophils Relative % 55.2 43.0 - 77.0 %   Lymphocytes Relative 34.2 12.0 - 46.0 %   Monocytes Relative 6.9 3.0 - 12.0 %   Eosinophils Relative 2.6 0.0 - 5.0 %   Basophils Relative 1.1 0.0 - 3.0 %   Neutro Abs 3.3 1.4 - 7.7 K/uL   Lymphs Abs 2.1 0.7 - 4.0 K/uL   Monocytes Absolute 0.4 0.1 - 1.0 K/uL   Eosinophils Absolute 0.2 0.0 - 0.7 K/uL   Basophils Absolute 0.1 0.0 - 0.1 K/uL  Lipase  Result Value Ref Range   Lipase 87.0 (H) 11.0 - 59.0 U/L    Assessment and Plan  Allergic dermatitis Assessment & Plan: Acute, likely insect bite reaction.  Significant pain and swelling in the left forearm concerning  for bacterial superinfection. Treat allergic reaction with prednisone taper and oral antihistamine such as Zyrtec daily.  No red flags or evidence of airway compromise. Return and ER precautions provided   Left arm cellulitis Assessment & Plan: Acute, Cover for bacterial superinfection with likely staph.  Use doxycycline 100 mg p.o. twice daily given other antibiotic intolerance/allergies.  No known MRSA exposure.  Return and ER precautions provided.  Follow-up in 2 days with myself or PCP for reevaluation.   Other orders -     Doxycycline Hyclate; Take 1 tablet (100 mg total) by mouth 2 (two) times daily.  Dispense: 20 tablet; Refill: 0 -     predniSONE; 3 tabs by mouth daily x 3 days, then 2 tabs by mouth daily x 2 days then 1 tab by mouth daily x 2 days  Dispense: 15 tablet; Refill: 0    Return in about 2 days (around 03/17/2023) for  follow up cellulitis and allergic reaction.   Kerby Nora, MD

## 2023-03-18 ENCOUNTER — Encounter: Payer: Self-pay | Admitting: Family Medicine

## 2023-03-18 ENCOUNTER — Ambulatory Visit (INDEPENDENT_AMBULATORY_CARE_PROVIDER_SITE_OTHER): Payer: Medicare HMO | Admitting: Family Medicine

## 2023-03-18 VITALS — BP 174/82 | HR 64 | Temp 97.6°F | Ht <= 58 in | Wt 119.4 lb

## 2023-03-18 DIAGNOSIS — L03114 Cellulitis of left upper limb: Secondary | ICD-10-CM

## 2023-03-18 DIAGNOSIS — I10 Essential (primary) hypertension: Secondary | ICD-10-CM | POA: Diagnosis not present

## 2023-03-18 DIAGNOSIS — L239 Allergic contact dermatitis, unspecified cause: Secondary | ICD-10-CM

## 2023-03-18 DIAGNOSIS — R21 Rash and other nonspecific skin eruption: Secondary | ICD-10-CM | POA: Insufficient documentation

## 2023-03-18 NOTE — Progress Notes (Signed)
Ph: [phone] Fax: [phone]   Patient ID: Beth Bailey, female    DOB: 12/30/1945, 77 y.o.   MRN: [id number]  This visit was conducted in person.  BP (!) 174/82   Pulse 64   Temp 97.6 F (36.4 C) (Temporal)   Ht 4\' 10"  (1.473 m)   Wt 119 lb 6 oz (54.1 kg)   SpO2 94%   BMI 24.95 kg/m   BP Readings from Last 3 Encounters:  03/18/23 (!) 174/82  03/15/23 138/82  11/16/22 128/64  180/80 on repeat testing   CC: recheck arm Subjective:   HPI: Beth Bailey is a 77 y.o. female presenting on 03/18/2023 for Cellulitis (Here for 2 day L arm cellulitis f/u, per Dr. Ermalene Searing. Pt states she's doing much better. )   Seen on Tuesday this week by Dr Ermalene Searing with left forearm rash that may have started after bug bites (?ants) while trimming bushes in the yard. This started 1.5+ wks ago. See photo from that visit. She had also used pesticide spray to kill ants. Thought allergic dermatitis to insect bite, r/o cellulitis. Treated with prednisone taper, oral antihistamine, started on doxycycline antibiotic course.   Overnight significant pruritus to left arm causing excoriations and skin thickening.  She was also using OTC calamine lotion and antibiotic ointment.   No new meds, vitamins, supplements. No new lotions, detergents, soaps or shampoos.   BP elevated today - only on losartan 50mg  daily. She notes she may have missed some doses recently. She limits salt intake. Denies HA, chest pain, dyspnea, leg swelling.   Notes triggering to left 3rd digit.      Relevant past medical, surgical, family and social history reviewed and updated as indicated. Interim medical history since our last visit reviewed. Allergies and medications reviewed and updated. Outpatient Medications Prior to Visit  Medication Sig Dispense Refill   atorvastatin (LIPITOR) 40 MG tablet TAKE 1 TABLET BY MOUTH DAILY AT 6 PM. 90 tablet 3   Biotin 5000 MCG CAPS Take 5,000 mcg by mouth 2 (two) times  a week. As needed     Calcium Carbonate (CALCIUM 600 PO) Take 1 tablet by mouth daily.     cetirizine (ZYRTEC) 10 MG tablet Take 10 mg by mouth at bedtime.      Cholecalciferol (VITAMIN D3) 25 MCG (1000 UT) CAPS Take 1 capsule (1,000 Units total) by mouth daily. 30 capsule    cyclobenzaprine (FLEXERIL) 10 MG tablet Take 1 tablet (10 mg total) by mouth 3 (three) times daily as needed for muscle spasms.     donepezil (ARICEPT) 10 MG tablet Take 1 tablet (10 mg total) by mouth at bedtime. 30 tablet 11   doxycycline (VIBRA-TABS) 100 MG tablet Take 1 tablet (100 mg total) by mouth 2 (two) times daily. 20 tablet 0   fluorometholone (FML) 0.1 % ophthalmic suspension Place 1 drop into the right eye daily.     gabapentin (NEURONTIN) 300 MG capsule Take 1 capsule (300 mg total) by mouth 2 (two) times daily. 180 capsule 3   HYDROcodone-acetaminophen (NORCO/VICODIN) 5-325 MG tablet TAKE 1 TABLET BY MOUTH EVERY 6 HOURS AS NEEDED FOR PAIN. 30 tablet 0   Lactase 9000 units TABS Take 1 tablet (9,000 Units total) by mouth 2 (two) times daily as needed.  0   losartan (COZAAR) 50 MG tablet Take 1 tablet (50 mg total) by mouth daily. 90 tablet 3   METAMUCIL FIBER PO Take by mouth as needed.  MISC NATURAL PRODUCTS PO Take by mouth as needed. Fast Acting Dairy Digestive supplement     Omega-3 Fatty Acids (FISH OIL PO) Take 1 capsule by mouth daily. OmegaXL - 2 capsule daily     omeprazole (PRILOSEC) 20 MG capsule TAKE 1 CAPSULE BY MOUTH EVERY DAY 90 capsule 1   Polyethyl Glycol-Propyl Glycol 0.4-0.3 % SOLN Place 1 drop into both eyes 3 (three) times daily as needed (for dry eyes).     predniSONE (DELTASONE) 10 MG tablet 3 tabs by mouth daily x 3 days, then 2 tabs by mouth daily x 2 days then 1 tab by mouth daily x 2 days 15 tablet 0   sertraline (ZOLOFT) 100 MG tablet Take 1 tablet (100 mg total) by mouth daily. 90 tablet 3   valACYclovir (VALTREX) 1000 MG tablet TAKE 1 TABLET (1,000 MG TOTAL) BY MOUTH AT BEDTIME.  90 tablet 1   No facility-administered medications prior to visit.     Per HPI unless specifically indicated in ROS section below Review of Systems  Objective:  BP (!) 174/82   Pulse 64   Temp 97.6 F (36.4 C) (Temporal)   Ht 4\' 10"  (1.473 m)   Wt 119 lb 6 oz (54.1 kg)   SpO2 94%   BMI 24.95 kg/m   Wt Readings from Last 3 Encounters:  03/18/23 119 lb 6 oz (54.1 kg)  03/15/23 121 lb (54.9 kg)  11/16/22 122 lb 6.4 oz (55.5 kg)      Physical Exam Vitals and nursing note reviewed.  Constitutional:      Appearance: Normal appearance. She is not ill-appearing.  Musculoskeletal:     Right lower leg: No edema.     Left lower leg: No edema.  Skin:    General: Skin is warm and dry.     Findings: Rash present. No erythema.     Comments:  Multiple itchy bug bites to BUE, BLE  Few rough erythematous itchy patches to upper and lower extremities  Neurological:     Mental Status: She is alert.  Psychiatric:        Mood and Affect: Mood normal.        Behavior: Behavior normal.        Assessment & Plan:   Problem List Items Addressed This Visit     Essential hypertension    Chronic, deteriorated in setting of prednisone use.  Pt unsure if she's missed any BP med doses.  Advised start monitoring BP at home, let me know if consistently >140/90 to titrate antihypertensive accordingly.  Reassess at CPE in 9 wks.      Allergic dermatitis - Primary    Overall improving  Anticipate more local reaction to bug bites than cellulitis. Rec finish prednisone taper, will drop doxy course to 7d for possible cellulitis. Continue antihistamine PRN.       Left arm cellulitis    This is much improved. Drop doxy to 7d course instead of full 10d course.         No orders of the defined types were placed in this encounter.   No orders of the defined types were placed in this encounter.   Patient Instructions  I'm glad rash is looking better. Finish up all the prednisone taper  (total 7 days) and only take 7 days of doxycycline.  Continue zyrtec allergy medicine, keep antihistamine on hand.  Careful with pesticide use.  Monitor blood pressures once you finish prednisone course, let us know sooner if your  blood pressures consistently >140/90.  Return after 05/13/2023 for physical.  Follow up plan: Return in about 9 weeks (around 05/20/2023), or if symptoms worsen or fail to improve, for annual exam, prior fasting for blood work.  Eustaquio Boyden, MD

## 2023-03-18 NOTE — Assessment & Plan Note (Addendum)
This is much improved. Drop doxy to 7d course instead of full 10d course.

## 2023-03-18 NOTE — Assessment & Plan Note (Deleted)
Overall improving  Anticipate more local reaction to bug bites than cellulitis. Rec finish prednisone taper, will drop doxy course to 7d for possible cellulitis. Continue antihistamine PRN.

## 2023-03-18 NOTE — Assessment & Plan Note (Signed)
Overall improving  Anticipate more local reaction to bug bites than cellulitis. Rec finish prednisone taper, will drop doxy course to 7d for possible cellulitis. Continue antihistamine PRN.

## 2023-03-18 NOTE — Assessment & Plan Note (Addendum)
Chronic, deteriorated in setting of prednisone use.  Pt unsure if she's missed any BP med doses.  Advised start monitoring BP at home, let me know if consistently >140/90 to titrate antihypertensive accordingly.  Reassess at CPE in 9 wks.

## 2023-03-18 NOTE — Patient Instructions (Addendum)
I'm glad rash is looking better. Finish up all the prednisone taper (total 7 days) and only take 7 days of doxycycline.  Continue zyrtec allergy medicine, keep antihistamine on hand.  Careful with pesticide use.  Monitor blood pressures once you finish prednisone course, let us know sooner if your blood pressures consistently >140/90.  Return after 05/13/2023 for physical.

## 2023-03-24 ENCOUNTER — Other Ambulatory Visit: Payer: Self-pay | Admitting: Family Medicine

## 2023-03-24 DIAGNOSIS — R4589 Other symptoms and signs involving emotional state: Secondary | ICD-10-CM

## 2023-03-24 DIAGNOSIS — I10 Essential (primary) hypertension: Secondary | ICD-10-CM

## 2023-04-27 DIAGNOSIS — H04123 Dry eye syndrome of bilateral lacrimal glands: Secondary | ICD-10-CM | POA: Diagnosis not present

## 2023-04-27 DIAGNOSIS — D492 Neoplasm of unspecified behavior of bone, soft tissue, and skin: Secondary | ICD-10-CM | POA: Diagnosis not present

## 2023-04-27 DIAGNOSIS — H43811 Vitreous degeneration, right eye: Secondary | ICD-10-CM | POA: Diagnosis not present

## 2023-04-27 DIAGNOSIS — H2512 Age-related nuclear cataract, left eye: Secondary | ICD-10-CM | POA: Diagnosis not present

## 2023-04-27 DIAGNOSIS — H1045 Other chronic allergic conjunctivitis: Secondary | ICD-10-CM | POA: Diagnosis not present

## 2023-04-27 DIAGNOSIS — Z961 Presence of intraocular lens: Secondary | ICD-10-CM | POA: Diagnosis not present

## 2023-05-02 DIAGNOSIS — H524 Presbyopia: Secondary | ICD-10-CM | POA: Diagnosis not present

## 2023-05-26 ENCOUNTER — Encounter (INDEPENDENT_AMBULATORY_CARE_PROVIDER_SITE_OTHER): Payer: Self-pay

## 2023-06-14 ENCOUNTER — Other Ambulatory Visit: Payer: Self-pay | Admitting: Family Medicine

## 2023-06-14 DIAGNOSIS — E118 Type 2 diabetes mellitus with unspecified complications: Secondary | ICD-10-CM

## 2023-06-14 DIAGNOSIS — E7849 Other hyperlipidemia: Secondary | ICD-10-CM

## 2023-06-14 DIAGNOSIS — E538 Deficiency of other specified B group vitamins: Secondary | ICD-10-CM

## 2023-06-14 DIAGNOSIS — M85852 Other specified disorders of bone density and structure, left thigh: Secondary | ICD-10-CM

## 2023-06-15 ENCOUNTER — Other Ambulatory Visit (INDEPENDENT_AMBULATORY_CARE_PROVIDER_SITE_OTHER): Payer: Medicare HMO

## 2023-06-15 DIAGNOSIS — E7849 Other hyperlipidemia: Secondary | ICD-10-CM | POA: Diagnosis not present

## 2023-06-15 DIAGNOSIS — E118 Type 2 diabetes mellitus with unspecified complications: Secondary | ICD-10-CM

## 2023-06-15 DIAGNOSIS — E538 Deficiency of other specified B group vitamins: Secondary | ICD-10-CM

## 2023-06-15 DIAGNOSIS — M85852 Other specified disorders of bone density and structure, left thigh: Secondary | ICD-10-CM

## 2023-06-15 LAB — LIPID PANEL
Cholesterol: 183 mg/dL (ref 0–200)
HDL: 56.5 mg/dL (ref >39.00)
LDL Cholesterol: 106 mg/dL — ABNORMAL HIGH (ref 0–99)
NonHDL: 126.83
Total CHOL/HDL Ratio: 3
Triglycerides: 103 mg/dL (ref 0.0–149.0)
VLDL: 20.6 mg/dL (ref 0.0–40.0)

## 2023-06-15 LAB — COMPREHENSIVE METABOLIC PANEL
ALT: 13 U/L (ref 0–35)
AST: 20 U/L (ref 0–37)
Albumin: 4 g/dL (ref 3.5–5.2)
Alkaline Phosphatase: 85 U/L (ref 39–117)
BUN: 10 mg/dL (ref 6–23)
CO2: 32 meq/L (ref 19–32)
Calcium: 9.4 mg/dL (ref 8.4–10.5)
Chloride: 105 meq/L (ref 96–112)
Creatinine, Ser: 0.75 mg/dL (ref 0.40–1.20)
GFR: 76.74 mL/min (ref >60.00)
Glucose, Bld: 110 mg/dL — ABNORMAL HIGH (ref 70–99)
Potassium: 4.2 meq/L (ref 3.5–5.1)
Sodium: 145 meq/L (ref 135–145)
Total Bilirubin: 0.5 mg/dL (ref 0.2–1.2)
Total Protein: 6.3 g/dL (ref 6.0–8.3)

## 2023-06-15 LAB — HEMOGLOBIN A1C: Hgb A1c MFr Bld: 6.7 % — ABNORMAL HIGH (ref 4.6–6.5)

## 2023-06-15 LAB — MICROALBUMIN / CREATININE URINE RATIO
Creatinine,U: 148.4 mg/dL
Microalb Creat Ratio: 1.5 mg/g (ref 0.0–30.0)
Microalb, Ur: 2.2 mg/dL — ABNORMAL HIGH (ref 0.0–1.9)

## 2023-06-15 LAB — VITAMIN B12: Vitamin B-12: 181 pg/mL — ABNORMAL LOW (ref 211–911)

## 2023-06-15 LAB — VITAMIN D 25 HYDROXY (VIT D DEFICIENCY, FRACTURES): VITD: 44.11 ng/mL (ref 30.00–100.00)

## 2023-06-17 ENCOUNTER — Ambulatory Visit (INDEPENDENT_AMBULATORY_CARE_PROVIDER_SITE_OTHER): Payer: Medicare HMO

## 2023-06-17 VITALS — Ht <= 58 in | Wt 121.0 lb

## 2023-06-17 DIAGNOSIS — Z0000 Encounter for general adult medical examination without abnormal findings: Secondary | ICD-10-CM | POA: Diagnosis not present

## 2023-06-17 NOTE — Patient Instructions (Signed)
Beth Bailey , Thank you for taking time to come for your Medicare Wellness Visit. I appreciate your ongoing commitment to your health goals. Please review the following plan we discussed and let me know if I can assist you in the future.   Referrals/Orders/Follow-Ups/Clinician Recommendations: Aim for 30 minutes of exercise or brisk walking, 6-8 glasses of water, and 5 servings of fruits and vegetables each day.   This is a list of the screening recommended for you and due dates:  Health Maintenance  Topic Date Due   Eye exam for diabetics  11/19/2017   COVID-19 Vaccine (3 - Pfizer risk series) 01/09/2020   Complete foot exam   06/24/2020   Flu Shot  05/28/2029*   Mammogram  09/16/2023   Hemoglobin A1C  12/13/2023   Colon Cancer Screening  04/17/2024   Yearly kidney function blood test for diabetes  06/14/2024   Yearly kidney health urinalysis for diabetes  06/14/2024   Medicare Annual Wellness Visit  06/16/2024   Pneumonia Vaccine  Completed   DEXA scan (bone density measurement)  Completed   Hepatitis C Screening  Completed   Zoster (Shingles) Vaccine  Completed   HPV Vaccine  Aged Out   DTaP/Tdap/Td vaccine  Discontinued  *Topic was postponed. The date shown is not the original due date.    Advanced directives: (In Chart) A copy of your advanced directives are scanned into your chart should your provider ever need it.  Next Medicare Annual Wellness Visit scheduled for next year: Yes  Insert Preventive Care attachment Insert FALL PREVENTION attachment if needed

## 2023-06-17 NOTE — Progress Notes (Signed)
Subjective:   Beth Bailey is a 77 y.o. female who presents for Medicare Annual (Subsequent) preventive examination.  Visit Complete: Virtual  I connected with  Beth Bailey on 06/17/23 by a audio enabled telemedicine application and verified that I am speaking with the correct person using two identifiers.  Patient Location: Home  Provider Location: Home Office  I discussed the limitations of evaluation and management by telemedicine. The patient expressed understanding and agreed to proceed.  Patient Medicare AWV questionnaire was completed by the patient on 06/17/2023; I have confirmed that all information answered by patient is correct and no changes since this date.  Review of Systems    Vital Signs: Unable to obtain new vitals due to this being a telehealth visit.  Cardiac Risk Factors include: advanced age (>53men, >74 women);dyslipidemia;hypertension     Objective:    Today's Vitals   06/17/23 1402  Weight: 121 lb (54.9 kg)  Height: 4\' 8"  (1.422 m)   Body mass index is 27.13 kg/m.     06/17/2023    2:07 PM 03/11/2022    3:23 PM 01/04/2020   11:52 PM 12/08/2018    4:20 PM 11/30/2017   12:31 PM 05/28/2016   11:15 AM 05/16/2015   11:17 AM  Advanced Directives  Does Patient Have a Medical Advance Directive? Yes Yes Yes No Yes Yes Yes  Type of Estate agent of [place];Living will Healthcare Power of [place];Living will Healthcare Power of [place];Living will  Healthcare Power of [place];Living will Healthcare Power of [place];Living will Healthcare Power of [place];Living will  Does patient want to make changes to medical advance directive? No - Patient declined     No - Patient declined No - Patient declined  Copy of Healthcare Power of Attorney in Chart? Yes - validated most recent copy scanned in chart (See row information) No - copy requested No - copy requested  No - copy requested No - copy requested No - copy requested   Would patient like information on creating a medical advance directive?   No - Patient declined No - Patient declined       Current Medications (verified) Outpatient Encounter Medications as of 06/17/2023  Medication Sig   atorvastatin (LIPITOR) 40 MG tablet TAKE 1 TABLET BY MOUTH DAILY AT 6 PM.   Biotin 5000 MCG CAPS Take 5,000 mcg by mouth 2 (two) times a week. As needed   Calcium Carbonate (CALCIUM 600 PO) Take 1 tablet by mouth daily.   cetirizine (ZYRTEC) 10 MG tablet Take 10 mg by mouth at bedtime.    Cholecalciferol (VITAMIN D3) 25 MCG (1000 UT) CAPS Take 1 capsule (1,000 Units total) by mouth daily.   cyclobenzaprine (FLEXERIL) 10 MG tablet Take 1 tablet (10 mg total) by mouth 3 (three) times daily as needed for muscle spasms.   donepezil (ARICEPT) 10 MG tablet Take 1 tablet (10 mg total) by mouth at bedtime.   doxycycline (VIBRA-TABS) 100 MG tablet Take 1 tablet (100 mg total) by mouth 2 (two) times daily.   fluorometholone (FML) 0.1 % ophthalmic suspension Place 1 drop into the right eye daily.   gabapentin (NEURONTIN) 300 MG capsule Take 1 capsule (300 mg total) by mouth 2 (two) times daily.   HYDROcodone-acetaminophen (NORCO/VICODIN) 5-325 MG tablet TAKE 1 TABLET BY MOUTH EVERY 6 HOURS AS NEEDED FOR PAIN.   Lactase 9000 units TABS Take 1 tablet (9,000 Units total) by mouth 2 (two) times daily as needed.   losartan (COZAAR) 50  MG tablet TAKE 1 TABLET BY MOUTH EVERY DAY   METAMUCIL FIBER PO Take by mouth as needed.   MISC NATURAL PRODUCTS PO Take by mouth as needed. Fast Acting Dairy Digestive supplement   Omega-3 Fatty Acids (FISH OIL PO) Take 1 capsule by mouth daily. OmegaXL - 2 capsule daily   omeprazole (PRILOSEC) 20 MG capsule TAKE 1 CAPSULE BY MOUTH EVERY DAY   Polyethyl Glycol-Propyl Glycol 0.4-0.3 % SOLN Place 1 drop into both eyes 3 (three) times daily as needed (for dry eyes).   predniSONE (DELTASONE) 10 MG tablet 3 tabs by mouth daily x 3 days, then 2 tabs by mouth daily  x 2 days then 1 tab by mouth daily x 2 days   sertraline (ZOLOFT) 100 MG tablet TAKE 1 TABLET BY MOUTH EVERY DAY   valACYclovir (VALTREX) 1000 MG tablet TAKE 1 TABLET (1,000 MG TOTAL) BY MOUTH AT BEDTIME.   No facility-administered encounter medications on file as of 06/17/2023.    Allergies (verified) Amoxicillin, Atorvastatin, Diclofenac sodium, Erythromycin, Penicillins, Pravastatin, Simvastatin, Statins, Trifluridine, Latex, and Lisinopril   History: Past Medical History:  Diagnosis Date   Allergic rhinitis    prior on allergy shots   Anxiety    Arthritis    Carotid stenosis 06/19/2016   R <40%, L 40-59%, rpt 1 yr (06/2016)   Colon polyps    Community acquired pneumonia of right lower lobe of lung 01/09/2020   GERD (gastroesophageal reflux disease)    controlled with PPI   Herpes simplex of eye    right - blind s/p corneal transplant   History of colon polyps    History of hiatal hernia    History of melanoma 06/05/2013   Right back. MM, Superficial spreading. Clark's level II, Breslow's 0.82mm. Excised 07/12/2013. [facility].   HLD (hyperlipidemia)    intolerant of statins   HNP (herniated nucleus pulposus), lumbar 12/07/2012   s/p surgery   Hyperglycemia    pt is unsure about this   Hypertension    has taken herself off her medications    Lesion of spleen 2013   2cm, stable on recheck   Malignant melanoma of right thigh (HCC) 03/29/2018   breslow thickness 0.53mm (Dr Juanita Craver)   NAFLD (nonalcoholic fatty liver disease) 16/1096   by [place]   Osteopenia 2016   dexa T -1.5 hip   Pneumonia    Past Surgical History:  Procedure Laterality Date   ABDOMINAL HYSTERECTOMY  10/12/1983   ovaries remain.  endometriosis, abnormal pap as well   CARPAL TUNNEL RELEASE Bilateral    CATARACT EXTRACTION Right 10/12/2011   CHOLECYSTECTOMY  10/11/2005   COLONOSCOPY  10/11/2010   polyp , mild divertic, int hem, rec rpt 5 yrs Leone Payor)   COLONOSCOPY  04/2021   3 TAs, diverticulosis,  rpt 3 yrs (Gessner)   CORNEAL TRANSPLANT Right 10/11/2010   dexa  10/11/2014   osteopenia T-1.5   LUMBAR LAMINECTOMY/DECOMPRESSION MICRODISCECTOMY Right 12/07/2012   Procedure: LUMBAR LAMINECTOMY/DECOMPRESSION MICRODISCECTOMY 1 LEVEL;  Surgeon: Carmela Hurt, MD; Laterality: Right;  Right lumbar five-sacral one diskectomy   NECK SURGERY  1990s   plate in neck   TONSILLECTOMY     Family History  Problem Relation Age of Onset   Alzheimer's disease Father    Colon cancer Sister 43   Colon polyps Sister    Cancer Maternal Uncle        questionable type   Heart attack Maternal Uncle    Kidney disease Maternal Uncle  Lung cancer Paternal Uncle    Diabetes Maternal Grandmother    Dementia Maternal Grandmother    Stroke Maternal Grandmother    Parkinson's disease Maternal Grandfather    Arthritis Other    Liver disease Neg Hx    Esophageal cancer Neg Hx    Stomach cancer Neg Hx    Rectal cancer Neg Hx    Social History   Socioeconomic History   Marital status: Married    Spouse name: Not on file   Number of children: Not on file   Years of education: Not on file   Highest education level: Not on file  Occupational History   Not on file  Tobacco Use   Smoking status: Never   Smokeless tobacco: Never  Vaping Use   Vaping status: Never Used  Substance and Sexual Activity   Alcohol use: No   Drug use: Yes    Types: Hydrocodone   Sexual activity: Not Currently    Birth control/protection: Post-menopausal  Other Topics Concern   Not on file  Social History Narrative   Widow. Husband Ed passed away 15-Jan-2020. Has birds and grown dogs. One daughter - neighbor   Occupation: retired, worked at [employer]   Activity: planning on joining gym   Diet: fruits/vegetables daily, some water   1-2 caffeinated beverages daily   Social Determinants of Corporate investment banker Strain: Low Risk  (06/17/2023)   Overall Financial Resource Strain (CARDIA)    Difficulty of  Paying Living Expenses: Not hard at all  Food Insecurity: No Food Insecurity (06/17/2023)   Hunger Vital Sign    Worried About Running Out of Food in the Last Year: Never true    Ran Out of Food in the Last Year: Never true  Transportation Needs: No Transportation Needs (06/17/2023)   PRAPARE - Administrator, Civil Service (Medical): No    Lack of Transportation (Non-Medical): No  Physical Activity: Insufficiently Active (06/17/2023)   Exercise Vital Sign    Days of Exercise per Week: 3 days    Minutes of Exercise per Session: 30 min  Stress: No Stress Concern Present (06/17/2023)   [employer] of Occupational Health - Occupational Stress Questionnaire    Feeling of Stress : Not at all  Social Connections: Moderately Integrated (06/17/2023)   Social Connection and Isolation Panel [NHANES]    Frequency of Communication with Friends and Family: More than three times a week    Frequency of Social Gatherings with Friends and Family: More than three times a week    Attends Religious Services: More than 4 times per year    Active Member of [employer] or Organizations: Yes    Attends Banker Meetings: More than 4 times per year    Marital Status: Widowed    Tobacco Counseling Counseling given: Not Answered   Clinical Intake:  Pre-visit preparation completed: Yes  Pain : No/denies pain     Nutritional Risks: None Diabetes: No  How often do you need to have someone help you when you read instructions, pamphlets, or other written materials from your doctor or pharmacy?: 1 - Never  Interpreter Needed?: No  Information entered by :: Renie Ora, LPN   Activities of Daily Living    06/17/2023    2:07 PM  In your present state of health, do you have any difficulty performing the following activities:  Hearing? 0  Vision? 0  Difficulty concentrating or making decisions? 0  Walking or climbing  stairs? 0  Dressing or bathing? 0  Doing errands, shopping? 0   Preparing Food and eating ? N  Using the Toilet? N  In the past six months, have you accidently leaked urine? N  Do you have problems with loss of bowel control? N  Managing your Medications? N  Managing your Finances? N  Housekeeping or managing your Housekeeping? N    Patient Care Team: Eustaquio Boyden, MD as PCP - General (Family Medicine) Vilinda Flake, [facility] (Inactive) as Pharmacist (Pharmacist)  Indicate any recent Medical Services you may have received from other than [facility] providers in the past year (date may be approximate).     Assessment:   This is a routine wellness examination for Beth Bailey.  Hearing/Vision screen Vision Screening - Comments:: Wears rx glasses - up to date with routine eye exams with  Dr.Groat    Goals Addressed             This Visit's Progress    DIET - INCREASE WATER INTAKE         Depression Screen    06/17/2023    2:06 PM 03/18/2023    2:32 PM 03/11/2022    3:27 PM 03/04/2021   11:20 AM 12/26/2019    1:26 PM 12/08/2018   12:04 PM 11/30/2017   12:30 PM  PHQ 2/9 Scores  PHQ - 2 Score 0 0 0 2 0 0 0  PHQ- 9 Score  3  5 2  0 0    Fall Risk    06/17/2023    2:03 PM 03/11/2022    3:26 PM 03/04/2021   10:18 AM 12/26/2019   12:34 PM 12/08/2018   12:04 PM  Fall Risk   Falls in the past year? 0 1  1 0  Comment  tripped over things  [place] while assisting husband   Number falls in past yr: 0 1 1 0   Injury with Fall? 0 0 0 0   Risk for fall due to : No Fall Risks Medication side effect     Follow up Falls prevention discussed Falls evaluation completed;Education provided;Falls prevention discussed       MEDICARE RISK AT HOME: Medicare Risk at Home Any stairs in or around the home?: No If so, are there any without handrails?: No Home free of loose throw rugs in walkways, pet beds, electrical cords, etc?: Yes Adequate lighting in your home to reduce risk of falls?: Yes Life alert?: No Use of a cane, walker or w/c?: No Grab bars in the  bathroom?: Yes Shower chair or bench in shower?: Yes Elevated toilet seat or a handicapped toilet?: Yes  TIMED UP AND GO:  Was the test performed?  No    Cognitive Function:    12/08/2018   12:04 PM 11/30/2017   12:30 PM 05/28/2016   11:42 AM  MMSE - Mini Mental State Exam  Orientation to time 5 5 5   Orientation to Place 5 5 5   Registration 3 3 3   Attention/ Calculation 0 0 0  Recall 3 3 3   Language- name 2 objects 0 0 0  Language- repeat 1 1 1   Language- follow 3 step command 3 3 3   Language- read & follow direction 0 0 0  Write a sentence 0 0 0  Copy design 0 0 0  Total score 20 20 20         06/17/2023    2:07 PM 03/11/2022    3:29 PM  6CIT Screen  What Year?  0 points 0 points  What month? 0 points 0 points  What time? 0 points 0 points  Count back from 20 0 points 0 points  Months in reverse 0 points 4 points  Repeat phrase 0 points 8 points  Total Score 0 points 12 points    Immunizations Immunization History  Administered Date(s) Administered   PFIZER(Purple Top)SARS-COV-2 Vaccination 11/18/2019, 12/12/2019   Pneumococcal Conjugate-13 08/27/2014   Pneumococcal Polysaccharide-23 05/31/2016   Zoster Recombinant(Shingrix) 03/23/2021, 06/16/2022    TDAP status: Due, Education has been provided regarding the importance of this vaccine. Advised may receive this vaccine at local pharmacy or Health Dept. Aware to provide a copy of the vaccination record if obtained from local pharmacy or Health Dept. Verbalized acceptance and understanding.  Flu Vaccine status: Up to date  Pneumococcal vaccine status: Up to date  Covid-19 vaccine status: Completed vaccines  Qualifies for Shingles Vaccine? Yes   Zostavax completed Yes   Shingrix Completed?: Yes  Screening Tests Health Maintenance  Topic Date Due   OPHTHALMOLOGY EXAM  11/19/2017   COVID-19 Vaccine (3 - Pfizer risk series) 01/09/2020   FOOT EXAM  06/24/2020   INFLUENZA VACCINE  05/28/2029 (Originally 05/12/2023)    MAMMOGRAM  09/16/2023   HEMOGLOBIN A1C  12/13/2023   Colonoscopy  04/17/2024   Diabetic kidney evaluation - eGFR measurement  06/14/2024   Diabetic kidney evaluation - Urine ACR  06/14/2024   Medicare Annual Wellness (AWV)  06/16/2024   Pneumonia Vaccine 55+ Years old  Completed   DEXA SCAN  Completed   Hepatitis C Screening  Completed   Zoster Vaccines- Shingrix  Completed   HPV VACCINES  Aged Out   DTaP/Tdap/Td  Discontinued    Health Maintenance  Health Maintenance Due  Topic Date Due   OPHTHALMOLOGY EXAM  11/19/2017   COVID-19 Vaccine (3 - Pfizer risk series) 01/09/2020   FOOT EXAM  06/24/2020    Colorectal cancer screening: Type of screening: Colonoscopy. Completed 04/17/2021. Repeat every 3 years  Mammogram status: Completed 09/15/2022. Repeat every year  Bone Density status: Completed 12/22/2021. Results reflect: Bone density results: OSTEOPOROSIS. Repeat every 2 years.  Lung Cancer Screening: (Low Dose CT Chest recommended if Age 26-80 years, 20 pack-year currently smoking OR have quit w/in 15years.) does not qualify.   Lung Cancer Screening Referral: n/a  Additional Screening:  Hepatitis C Screening: does not qualify;  Vision Screening: Recommended annual ophthalmology exams for early detection of glaucoma and other disorders of the eye. Is the patient up to date with their annual eye exam?  Yes  Who is the provider or what is the name of the office in which the patient attends annual eye exams? Dr.Groat  If pt is not established with a provider, would they like to be referred to a provider to establish care? No .   Dental Screening: Recommended annual dental exams for proper oral hygiene   Community Resource Referral / Chronic Care Management: CRR required this visit?  No   CCM required this visit?  No     Plan:     I have personally reviewed and noted the following in the patient's chart:   Medical and social history Use of alcohol, tobacco or  illicit drugs  Current medications and supplements including opioid prescriptions. Patient is not currently taking opioid prescriptions. Functional ability and status Nutritional status Physical activity Advanced directives List of other physicians Hospitalizations, surgeries, and ER visits in previous 12 months Vitals Screenings to include cognitive, depression, and falls  Referrals and appointments  In addition, I have reviewed and discussed with patient certain preventive protocols, quality metrics, and best practice recommendations. A written personalized care plan for preventive services as well as general preventive health recommendations were provided to patient.     Lorrene Reid, LPN   10/16/1094   After Visit Summary: (MyChart) Due to this being a telephonic visit, the after visit summary with patients personalized plan was offered to patient via MyChart   Nurse Notes: none

## 2023-06-22 ENCOUNTER — Ambulatory Visit: Payer: Medicare HMO | Admitting: Family Medicine

## 2023-06-22 ENCOUNTER — Encounter: Payer: Self-pay | Admitting: Family Medicine

## 2023-06-22 VITALS — BP 138/66 | HR 70 | Temp 97.6°F | Ht <= 58 in | Wt 116.2 lb

## 2023-06-22 DIAGNOSIS — B0050 Herpesviral ocular disease, unspecified: Secondary | ICD-10-CM

## 2023-06-22 DIAGNOSIS — I6523 Occlusion and stenosis of bilateral carotid arteries: Secondary | ICD-10-CM

## 2023-06-22 DIAGNOSIS — Z7189 Other specified counseling: Secondary | ICD-10-CM

## 2023-06-22 DIAGNOSIS — I10 Essential (primary) hypertension: Secondary | ICD-10-CM

## 2023-06-22 DIAGNOSIS — G3184 Mild cognitive impairment, so stated: Secondary | ICD-10-CM

## 2023-06-22 DIAGNOSIS — E118 Type 2 diabetes mellitus with unspecified complications: Secondary | ICD-10-CM

## 2023-06-22 DIAGNOSIS — M1120 Other chondrocalcinosis, unspecified site: Secondary | ICD-10-CM

## 2023-06-22 DIAGNOSIS — E7849 Other hyperlipidemia: Secondary | ICD-10-CM | POA: Diagnosis not present

## 2023-06-22 DIAGNOSIS — Z0001 Encounter for general adult medical examination with abnormal findings: Secondary | ICD-10-CM | POA: Diagnosis not present

## 2023-06-22 DIAGNOSIS — K219 Gastro-esophageal reflux disease without esophagitis: Secondary | ICD-10-CM | POA: Diagnosis not present

## 2023-06-22 DIAGNOSIS — M545 Low back pain, unspecified: Secondary | ICD-10-CM

## 2023-06-22 DIAGNOSIS — G8929 Other chronic pain: Secondary | ICD-10-CM

## 2023-06-22 DIAGNOSIS — E538 Deficiency of other specified B group vitamins: Secondary | ICD-10-CM | POA: Insufficient documentation

## 2023-06-22 DIAGNOSIS — R4589 Other symptoms and signs involving emotional state: Secondary | ICD-10-CM

## 2023-06-22 DIAGNOSIS — K760 Fatty (change of) liver, not elsewhere classified: Secondary | ICD-10-CM

## 2023-06-22 DIAGNOSIS — M159 Polyosteoarthritis, unspecified: Secondary | ICD-10-CM

## 2023-06-22 DIAGNOSIS — M85852 Other specified disorders of bone density and structure, left thigh: Secondary | ICD-10-CM

## 2023-06-22 DIAGNOSIS — Z85820 Personal history of malignant melanoma of skin: Secondary | ICD-10-CM

## 2023-06-22 DIAGNOSIS — M150 Primary generalized (osteo)arthritis: Secondary | ICD-10-CM

## 2023-06-22 MED ORDER — VALACYCLOVIR HCL 1 G PO TABS
1000.0000 mg | ORAL_TABLET | Freq: Every day | ORAL | 4 refills | Status: DC
Start: 2023-06-22 — End: 2024-06-27

## 2023-06-22 MED ORDER — CYANOCOBALAMIN 1000 MCG/ML IJ SOLN
1000.0000 ug | Freq: Once | INTRAMUSCULAR | Status: AC
Start: 2023-06-22 — End: 2023-06-22
  Administered 2023-06-22: 1000 ug via INTRAMUSCULAR

## 2023-06-22 MED ORDER — LOSARTAN POTASSIUM 50 MG PO TABS
50.0000 mg | ORAL_TABLET | Freq: Every day | ORAL | 4 refills | Status: DC
Start: 2023-06-22 — End: 2024-06-27

## 2023-06-22 MED ORDER — SERTRALINE HCL 100 MG PO TABS
100.0000 mg | ORAL_TABLET | Freq: Every day | ORAL | 4 refills | Status: DC
Start: 2023-06-22 — End: 2023-09-05

## 2023-06-22 MED ORDER — VITAMIN B-12 1000 MCG PO TABS: 1000.0000 ug | ORAL_TABLET | Freq: Every day | ORAL | Status: DC

## 2023-06-22 MED ORDER — ATORVASTATIN CALCIUM 40 MG PO TABS
ORAL_TABLET | ORAL | 4 refills | Status: DC
Start: 2023-06-22 — End: 2024-06-27

## 2023-06-22 NOTE — Progress Notes (Signed)
Ph: [phone] Fax: [phone]   Patient ID: Beth Bailey, female    DOB: Sep 30, 1946, 77 y.o.   MRN: [id number]  This visit was conducted in person.  BP 138/66   Pulse 70   Temp 97.6 F (36.4 C) (Temporal)   Ht 4\' 9"  (1.448 m)   Wt 116 lb 4 oz (52.7 kg)   SpO2 97%   BMI 25.16 kg/m    CC: CPE Subjective:   HPI: Beth Bailey is a 77 y.o. female presenting on 06/22/2023 for Annual Exam (MCR prt 2 [AWV- 06/17/23]. Pt accompanied by daughter, Marcelino Duster. )   Saw health advisor last week for medicare wellness visit. Note reviewed.   No results found.  Flowsheet Row Clinical Support from 06/17/2023 in [facility] [place]  PHQ-2 Total Score 0          06/17/2023    2:03 PM 03/11/2022    3:26 PM 03/04/2021   10:18 AM 12/26/2019   12:34 PM 12/08/2018   12:04 PM  Fall Risk   Falls in the past year? 0 1  1 0  Comment  tripped over things  Larey Seat while assisting husband   Number falls in past yr: 0 1 1 0   Injury with Fall? 0 0 0 0   Risk for fall due to : No Fall Risks Medication side effect     Follow up Falls prevention discussed Falls evaluation completed;Education provided;Falls prevention discussed      Husband passed away January 02, 2020. Mother passed away 12/11/2020 after her 98th birthday.   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  Improvement noted in latest 6CIT score:    06/17/2023    2:07 PM 03/11/2022    3:29 PM  6CIT Screen  What Year? 0 points 0 points  What month? 0 points 0 points  What time? 0 points 0 points  Count back from 20 0 points 0 points  Months in reverse 0 points 4 points  Repeat phrase 0 points 8 points  Total Score 0 points 12 points  05/2022: Mental Status Exam (MMSE): 22/30 (value/max value), CDT Score:1/4   Sees Dr Yves Dill PM&R for chronic back pain. She is on gabapentin 300mg  bid, norco 5/325mg  BID PRN, flexeril 5mg  PRN.    She also takes valtrex  1000mg  nightly preventatively after R corneal transplant surgery at [facility] (2012). Planning to re establish with Dr Dione Booze or [facility].   Poor diet, likes sugar/candy. Notes frequent loose stools.   Preventative: COLONOSCOPY 10/2010 - polyp, mild divertic, int hem, rec rpt 5 yrs given fmhx [facility] Payor)  COLONOSCOPY 04/2021 - 3 TAs, diverticulosis, rpt 3 yrs [facility] Payor)  Last well woman with prior PCP 2013, s/p hysterectomy 1985, ovaries remain. Denies pelvic pain or pressure. No vaginal/vulvar skin changes.  [facility]  Dexa - 12/2014 osteopenia T -1.5 at hip.  DEXA 12/2021 - T -1.8 LFN, elevated fracture risk (any 23%, hip 13%) - consider treatment, rpt 2 yrs.  Lung cancer screening - not eligible  Flu - declines  COVID vaccine Pfizer 12-12-2019, 12/2019  Prevnar-13 08/2014. Pneumovax 05/2016  Pfizer covid vaccine series completed 12/2019  Tetanus - unsure Shingrix - 03/2021, 06/2022  Advanced directive - scanned into chart 05/2022. Son Ramon Dredge and daughter Karenann Cai are HCPOA. Does not want prolonged life support if terminal condition. Advanced directive overrides HCPOA.  Seat belt use discussed.  Sunscreen use discussed.  No changing moles on skin. Overdue for derm f/u in h/o MM.  Non smoker  Alcohol - none  Dentist - yearly - has upper dentures  Eye exam yearly ([facility]) - h/o R corneal transplant  Bowel - no constipation  Bladder - no incontinence    Widow - husband with dementia passed away 2019/12/27 One daughter - neighbor Occupation: retired, worked at [employer]   Activity: no regular exercise, but walking regularly - has membership to [employer] Diet: fruits/vegetables daily, good water     Relevant past medical, surgical, family and social history reviewed and updated as indicated. Interim medical history since our last visit reviewed. Allergies and medications reviewed and updated. Outpatient Medications Prior to Visit  Medication Sig Dispense Refill    Biotin 5000 MCG CAPS Take 5,000 mcg by mouth 2 (two) times a week. As needed     Calcium Carbonate (CALCIUM 600 PO) Take 1 tablet by mouth daily.     cetirizine (ZYRTEC) 10 MG tablet Take 10 mg by mouth at bedtime.      Cholecalciferol (VITAMIN D3) 25 MCG (1000 UT) CAPS Take 1 capsule (1,000 Units total) by mouth daily. 30 capsule    donepezil (ARICEPT) 10 MG tablet Take 1 tablet (10 mg total) by mouth at bedtime. 30 tablet 11   fluorometholone (FML) 0.1 % ophthalmic suspension Place 1 drop into the right eye daily.     HYDROcodone-acetaminophen (NORCO/VICODIN) 5-325 MG tablet TAKE 1 TABLET BY MOUTH EVERY 6 HOURS AS NEEDED FOR PAIN. 30 tablet 0   Lactase 9000 units TABS Take 1 tablet (9,000 Units total) by mouth 2 (two) times daily as needed.  0   loperamide (IMODIUM A-D) 2 MG tablet Take 1 tablet (2 mg total) by mouth daily as needed for diarrhea or loose stools.     METAMUCIL FIBER PO Take by mouth as needed.     MISC NATURAL PRODUCTS PO Take by mouth as needed. Fast Acting Dairy Digestive supplement     Omega-3 Fatty Acids (FISH OIL PO) Take 1 capsule by mouth daily. OmegaXL - 2 capsule daily     omeprazole (PRILOSEC) 20 MG capsule TAKE 1 CAPSULE BY MOUTH EVERY DAY 90 capsule 1   Polyethyl Glycol-Propyl Glycol 0.4-0.3 % SOLN Place 1 drop into both eyes 3 (three) times daily as needed (for dry eyes).     atorvastatin (LIPITOR) 40 MG tablet TAKE 1 TABLET BY MOUTH DAILY AT 6 PM. 90 tablet 3   cyclobenzaprine (FLEXERIL) 10 MG tablet Take 1 tablet (10 mg total) by mouth 3 (three) times daily as needed for muscle spasms.     losartan (COZAAR) 50 MG tablet TAKE 1 TABLET BY MOUTH EVERY DAY 90 tablet 1   sertraline (ZOLOFT) 100 MG tablet TAKE 1 TABLET BY MOUTH EVERY DAY 90 tablet 1   valACYclovir (VALTREX) 1000 MG tablet TAKE 1 TABLET (1,000 MG TOTAL) BY MOUTH AT BEDTIME. 90 tablet 1   gabapentin (NEURONTIN) 300 MG capsule Take 1 capsule (300 mg total) by mouth 2 (two) times daily. (Patient not  taking: Reported on 06/22/2023) 180 capsule 3   doxycycline (VIBRA-TABS) 100 MG tablet Take 1 tablet (100 mg total) by mouth 2 (two) times daily. 20 tablet 0   predniSONE (DELTASONE) 10 MG tablet 3 tabs by mouth daily x 3 days, then 2 tabs by mouth daily x 2 days then 1 tab by mouth daily x 2 days 15 tablet 0   No facility-administered medications prior to visit.  Per HPI unless specifically indicated in ROS section below Review of Systems  Constitutional:  Positive for appetite change (poor). Negative for activity change, chills, fatigue, fever and unexpected weight change.  HENT:  Negative for hearing loss.   Eyes:  Negative for visual disturbance.  Respiratory:  Negative for cough, chest tightness, shortness of breath and wheezing.   Cardiovascular:  Negative for chest pain, palpitations and leg swelling.  Gastrointestinal:  Positive for diarrhea (loose stools). Negative for abdominal distention, abdominal pain, blood in stool, constipation, nausea and vomiting.  Genitourinary:  Negative for difficulty urinating and hematuria.  Musculoskeletal:  Negative for arthralgias, myalgias and neck pain.  Skin:  Negative for rash.  Neurological:  Negative for dizziness, seizures, syncope and headaches.  Hematological:  Negative for adenopathy. Does not bruise/bleed easily.  Psychiatric/Behavioral:  Negative for dysphoric mood. The patient is not nervous/anxious.     Objective:  BP 138/66   Pulse 70   Temp 97.6 F (36.4 C) (Temporal)   Ht 4\' 9"  (1.448 m)   Wt 116 lb 4 oz (52.7 kg)   SpO2 97%   BMI 25.16 kg/m   Wt Readings from Last 3 Encounters:  06/22/23 116 lb 4 oz (52.7 kg)  06/17/23 121 lb (54.9 kg)  03/18/23 119 lb 6 oz (54.1 kg)      Physical Exam Vitals and nursing note reviewed.  Constitutional:      Appearance: Normal appearance. She is not ill-appearing.  HENT:     Head: Normocephalic and atraumatic.     Right Ear: Tympanic membrane, ear canal and external ear normal.  There is no impacted cerumen.     Left Ear: Tympanic membrane, ear canal and external ear normal. There is no impacted cerumen.     Mouth/Throat:     Mouth: Mucous membranes are moist.     Pharynx: Oropharynx is clear. No oropharyngeal exudate or posterior oropharyngeal erythema.  Eyes:     General:        Right eye: No discharge.        Left eye: No discharge.     Extraocular Movements: Extraocular movements intact.     Conjunctiva/sclera: Conjunctivae normal.     Pupils: Pupils are equal, round, and reactive to light.  Neck:     Thyroid: No thyroid mass or thyromegaly.     Vascular: No carotid bruit.  Cardiovascular:     Rate and Rhythm: Normal rate and regular rhythm.     Pulses: Normal pulses.     Heart sounds: Normal heart sounds. No murmur heard. Pulmonary:     Effort: Pulmonary effort is normal. No respiratory distress.     Breath sounds: Normal breath sounds. No wheezing, rhonchi or rales.  Abdominal:     General: Bowel sounds are normal. There is no distension.     Palpations: Abdomen is soft. There is no mass.     Tenderness: There is no abdominal tenderness. There is no guarding or rebound.     Hernia: No hernia is present.  Musculoskeletal:     Cervical back: Normal range of motion and neck supple. No rigidity.     Right lower leg: No edema.     Left lower leg: No edema.  Lymphadenopathy:     Cervical: No cervical adenopathy.  Skin:    General: Skin is warm and dry.     Findings: No rash.  Neurological:     General: No focal deficit present.     Mental Status: She is  alert. Mental status is at baseline.  Psychiatric:        Mood and Affect: Mood normal.        Behavior: Behavior normal.       Results for orders placed or performed in visit on 06/15/23  Vitamin B12  Result Value Ref Range   Vitamin B-12 181 (L) 211 - 911 pg/mL  VITAMIN D 25 Hydroxy (Vit-D Deficiency, Fractures)  Result Value Ref Range   VITD 44.11 30.00 - 100.00 ng/mL  Microalbumin /  creatinine urine ratio  Result Value Ref Range   Microalb, Ur 2.2 (H) 0.0 - 1.9 mg/dL   Creatinine,U 440.3 mg/dL   Microalb Creat Ratio 1.5 0.0 - 30.0 mg/g  Hemoglobin A1c  Result Value Ref Range   Hgb A1c MFr Bld 6.7 (H) 4.6 - 6.5 %  Comprehensive metabolic panel  Result Value Ref Range   Sodium 145 135 - 145 mEq/L   Potassium 4.2 3.5 - 5.1 mEq/L   Chloride 105 96 - 112 mEq/L   CO2 32 19 - 32 mEq/L   Glucose, Bld 110 (H) 70 - 99 mg/dL   BUN 10 6 - 23 mg/dL   Creatinine, Ser 4.74 0.40 - 1.20 mg/dL   Total Bilirubin 0.5 0.2 - 1.2 mg/dL   Alkaline Phosphatase 85 39 - 117 U/L   AST 20 0 - 37 U/L   ALT 13 0 - 35 U/L   Total Protein 6.3 6.0 - 8.3 g/dL   Albumin 4.0 3.5 - 5.2 g/dL   GFR 25.95 >63.87 mL/min   Calcium 9.4 8.4 - 10.5 mg/dL  Lipid panel  Result Value Ref Range   Cholesterol 183 0 - 200 mg/dL   Triglycerides 564.3 0.0 - 149.0 mg/dL   HDL 32.95 >18.84 mg/dL   VLDL 16.6 0.0 - 06.3 mg/dL   LDL Cholesterol 016 (H) 0 - 99 mg/dL   Total CHOL/HDL Ratio 3    NonHDL 126.83       06/17/2023    2:06 PM 03/18/2023    2:32 PM 03/11/2022    3:27 PM 03/04/2021   11:20 AM 12/26/2019    1:26 PM  Depression screen PHQ 2/9  Decreased Interest 0 0 0 2 0  Down, Depressed, Hopeless 0 0 0 0 0  PHQ - 2 Score 0 0 0 2 0  Altered sleeping  0  0 0  Tired, decreased energy  1  0 0  Change in appetite  2  2 1   Feeling bad or failure about yourself   0  0 0  Trouble concentrating  0  1 1  Moving slowly or fidgety/restless  0  0 0  Suicidal thoughts  0  0 0  PHQ-9 Score  3  5 2       03/18/2023    2:32 PM 03/04/2021   11:20 AM 12/26/2019    1:26 PM  GAD 7 : Generalized Anxiety Score  Nervous, Anxious, on Edge 0 1 2  Control/stop worrying 0 0 0  Worry too much - different things 0 1 0  Trouble relaxing 0 0 1  Restless 1 1 0  Easily annoyed or irritable 0 0 2  Afraid - awful might happen 0 0 0  Total GAD 7 Score 1 3 5    Assessment & Plan:   Problem List Items Addressed This Visit      Advanced care planning/counseling discussion (Chronic)    Advanced directive - scanned into chart 05/2022. Son Ramon Dredge and daughter  Karenann Cai are HCPOA. Does not want prolonged life support if terminal condition. Advanced directive overrides HCPOA.       Encounter for general adult medical examination with abnormal findings - Primary (Chronic)    Preventative protocols reviewed and updated unless pt declined. Discussed healthy diet and lifestyle.       Controlled diabetes mellitus type 2 with complications (HCC)    Reviewed diet controlled diabetes diagnosis. Encouraged renewed efforts to follow low sugar low-carb diabetic diet. Return to clinic in 6 months for diabetes follow-up      Relevant Medications   atorvastatin (LIPITOR) 40 MG tablet   losartan (COZAAR) 50 MG tablet   Essential hypertension    Chronic, stable on current regimen - continue.       Relevant Medications   atorvastatin (LIPITOR) 40 MG tablet   losartan (COZAAR) 50 MG tablet   GERD    Stable, on low-dose omeprazole-continue      Relevant Medications   loperamide (IMODIUM A-D) 2 MG tablet   Osteoarthritis   Familial hyperlipidemia, high LDL    Chronic, stable on atorvastatin 40 mg daily - continue this. The 10-year ASCVD risk score (Arnett DK, et al., 2019) is: 48.8%   Values used to calculate the score:     Age: 33 years     Sex: Female     Is Non-Hispanic African American: No     Diabetic: Yes     Tobacco smoker: No     Systolic Blood Pressure: 138 mmHg     Is BP treated: Yes     HDL Cholesterol: 56.5 mg/dL     Total Cholesterol: 183 mg/dL      Relevant Medications   atorvastatin (LIPITOR) 40 MG tablet   losartan (COZAAR) 50 MG tablet   Herpes simplex of eye    Status post corneal transplant Continue preventative Valtrex 1000 mg nightly. Has reestablished with eye doctor      Relevant Medications   valACYclovir (VALTREX) 1000 MG tablet   Osteopenia    Continue regular calcium and  vitamin D supplementation. Discussed Fosamax medication and administration-she declines starting at this time. Consider updated DEXA scan next year.      NAFLD (nonalcoholic fatty liver disease)   Depressed mood    Stable time on sertraline 100 mg daily. Agrees to try lower dose-will drop to half tablet or 50 mg daily, update with effect.      Relevant Medications   sertraline (ZOLOFT) 100 MG tablet   Carotid stenosis    No bruit appreciated. She is on high potency statin.  Will need to discuss repeat US next visit.       Relevant Medications   atorvastatin (LIPITOR) 40 MG tablet   losartan (COZAAR) 50 MG tablet   History of malignant melanoma of skin    Encouraged dermatology follow-up.      Calcium pyrophosphate deposition disease (CPPD)    Consider as needed colchicine use.  Kidney function should tolerate this.      Amnestic MCI (mild cognitive impairment with memory loss)    Overall stable to improved on Aricept 10 mg nightly-continue this. Encouraged healthy nutritious well-balanced diet to support a healthy man.      Chronic low back pain    Followed by PM&R Dr. Yves Dill. She stopped gabapentin, continues Norco as needed She is no longer taking Flexeril      Relevant Medications   sertraline (ZOLOFT) 100 MG tablet   Vitamin B12 deficiency  Levels markedly low today - she had stopped b12 replacement B12 shot today then restart daily.         Meds ordered this encounter  Medications   atorvastatin (LIPITOR) 40 MG tablet    Sig: TAKE 1 TABLET BY MOUTH DAILY AT 6 PM.    Dispense:  90 tablet    Refill:  4   losartan (COZAAR) 50 MG tablet    Sig: Take 1 tablet (50 mg total) by mouth daily.    Dispense:  90 tablet    Refill:  4   sertraline (ZOLOFT) 100 MG tablet    Sig: Take 1 tablet (100 mg total) by mouth daily. TAKE 1 TABLET BY MOUTH EVERY DAY    Dispense:  90 tablet    Refill:  4   valACYclovir (VALTREX) 1000 MG tablet    Sig: Take 1  tablet (1,000 mg total) by mouth at bedtime.    Dispense:  90 tablet    Refill:  4   cyanocobalamin (VITAMIN B12) 1000 MCG tablet    Sig: Take 1 tablet (1,000 mcg total) by mouth daily.   cyanocobalamin (VITAMIN B12) injection 1,000 mcg    No orders of the defined types were placed in this encounter.   Patient Instructions  B12 shot today then start b12 daily.  Work on [employer], limit sweets and sweetened beverages.  Call dermatology to see if you can schedule appointment at [facility] office.  Try half dose sertraline (50mg  daily) for mood.  Good to see you today  Return as needed or in 6 months for diabetes check   Follow up plan: Return in about 6 months (around 12/20/2023), or if symptoms worsen or fail to improve, for follow up visit.  Eustaquio Boyden, MD

## 2023-06-22 NOTE — Assessment & Plan Note (Signed)
Chronic, stable on current regimen - continue. 

## 2023-06-22 NOTE — Assessment & Plan Note (Signed)
Encouraged dermatology follow up.

## 2023-06-22 NOTE — Assessment & Plan Note (Signed)
No bruit appreciated. She is on high potency statin.  Will need to discuss repeat US next visit.

## 2023-06-22 NOTE — Assessment & Plan Note (Signed)
Stable, on low-dose omeprazole-continue

## 2023-06-22 NOTE — Patient Instructions (Addendum)
B12 shot today then start b12 daily.  Work on [employer], limit sweets and sweetened beverages.  Call dermatology to see if you can schedule appointment at [facility] office.  Try half dose sertraline (50mg  daily) for mood.  Good to see you today  Return as needed or in 6 months for diabetes check

## 2023-06-22 NOTE — Assessment & Plan Note (Signed)
Status post corneal transplant Continue preventative Valtrex 1000 mg nightly. Has reestablished with eye doctor

## 2023-06-22 NOTE — Assessment & Plan Note (Addendum)
Overall stable to improved on Aricept 10 mg nightly-continue this. Encouraged healthy nutritious well-balanced diet to support a healthy man.

## 2023-06-22 NOTE — Assessment & Plan Note (Addendum)
Followed by PM&R Dr. Yves Dill. She stopped gabapentin, continues Norco as needed She is no longer taking Flexeril

## 2023-06-22 NOTE — Assessment & Plan Note (Addendum)
Reviewed diet controlled diabetes diagnosis. Encouraged renewed efforts to follow low sugar low-carb diabetic diet. Return to clinic in 6 months for diabetes follow-up

## 2023-06-22 NOTE — Assessment & Plan Note (Signed)
Levels markedly low today - she had stopped b12 replacement B12 shot today then restart daily.

## 2023-06-22 NOTE — Assessment & Plan Note (Signed)
Consider as needed colchicine use.  Kidney function should tolerate this.

## 2023-06-22 NOTE — Assessment & Plan Note (Signed)
Advanced directive - scanned into chart 05/2022. Son Beth Bailey and daughter Beth Bailey are HCPOA. Does not want prolonged life support if terminal condition. Advanced directive overrides HCPOA.

## 2023-06-22 NOTE — Assessment & Plan Note (Signed)
Chronic, stable on atorvastatin 40 mg daily - continue this. The 10-year ASCVD risk score (Arnett DK, et al., 2019) is: 48.8%   Values used to calculate the score:     Age: 78 years     Sex: Female     Is Non-Hispanic African American: No     Diabetic: Yes     Tobacco smoker: No     Systolic Blood Pressure: 138 mmHg     Is BP treated: Yes     HDL Cholesterol: 56.5 mg/dL     Total Cholesterol: 183 mg/dL

## 2023-06-22 NOTE — Assessment & Plan Note (Addendum)
Preventative protocols reviewed and updated unless pt declined. Discussed healthy diet and lifestyle.  

## 2023-06-22 NOTE — Assessment & Plan Note (Signed)
Stable time on sertraline 100 mg daily. Agrees to try lower dose-will drop to half tablet or 50 mg daily, update with effect.

## 2023-06-22 NOTE — Assessment & Plan Note (Signed)
Continue regular calcium and vitamin D supplementation. Discussed Fosamax medication and administration-she declines starting at this time. Consider updated DEXA scan next year.

## 2023-06-28 ENCOUNTER — Other Ambulatory Visit: Payer: Self-pay | Admitting: Family Medicine

## 2023-06-28 DIAGNOSIS — K219 Gastro-esophageal reflux disease without esophagitis: Secondary | ICD-10-CM

## 2023-07-04 DIAGNOSIS — M5126 Other intervertebral disc displacement, lumbar region: Secondary | ICD-10-CM | POA: Diagnosis not present

## 2023-07-04 DIAGNOSIS — M48062 Spinal stenosis, lumbar region with neurogenic claudication: Secondary | ICD-10-CM | POA: Diagnosis not present

## 2023-07-04 DIAGNOSIS — M5416 Radiculopathy, lumbar region: Secondary | ICD-10-CM | POA: Diagnosis not present

## 2023-07-08 ENCOUNTER — Encounter: Payer: Self-pay | Admitting: Family Medicine

## 2023-07-08 NOTE — Telephone Encounter (Signed)
Please see the MyChart message reply(ies) for my assessment and plan.  The patient gave consent for this Medical Advice Message and is aware that it may result in a bill to their insurance company as well as the possibility that this may result in a co-payment or deductible. They are an established patient, but are not seeking medical advice exclusively about a problem treated during an in person or video visit in the last 7 days. I did not recommend an in person or video visit within 7 days of my reply.  I spent a total of 8 minutes cumulative time within 7 days through MyChart messaging Beth Boyden, MD

## 2023-08-23 ENCOUNTER — Other Ambulatory Visit: Payer: Self-pay | Admitting: Family Medicine

## 2023-08-23 NOTE — Telephone Encounter (Signed)
LAST APPOINTMENT DATE: 06/22/23   NEXT APPOINTMENT DATE: 12/20/2023    LAST REFILL: 08/13/22  QTY: #[phone] [id number]

## 2023-09-05 ENCOUNTER — Ambulatory Visit: Payer: Medicare HMO | Admitting: Family Medicine

## 2023-09-05 ENCOUNTER — Encounter: Payer: Self-pay | Admitting: Family Medicine

## 2023-09-05 VITALS — BP 138/70 | HR 70 | Temp 98.0°F | Ht <= 58 in | Wt 115.4 lb

## 2023-09-05 DIAGNOSIS — R4589 Other symptoms and signs involving emotional state: Secondary | ICD-10-CM | POA: Diagnosis not present

## 2023-09-05 DIAGNOSIS — E538 Deficiency of other specified B group vitamins: Secondary | ICD-10-CM | POA: Diagnosis not present

## 2023-09-05 DIAGNOSIS — R195 Other fecal abnormalities: Secondary | ICD-10-CM | POA: Diagnosis not present

## 2023-09-05 DIAGNOSIS — G3184 Mild cognitive impairment, so stated: Secondary | ICD-10-CM

## 2023-09-05 LAB — CBC WITH DIFFERENTIAL/PLATELET
Basophils Absolute: 0.1 10*3/uL (ref 0.0–0.1)
Basophils Relative: 0.8 % (ref 0.0–3.0)
Eosinophils Absolute: 0.2 10*3/uL (ref 0.0–0.7)
Eosinophils Relative: 2.1 % (ref 0.0–5.0)
HCT: 40.4 % (ref 36.0–46.0)
Hemoglobin: 13.3 g/dL (ref 12.0–15.0)
Lymphocytes Relative: 22.6 % (ref 12.0–46.0)
Lymphs Abs: 1.8 10*3/uL (ref 0.7–4.0)
MCHC: 32.9 g/dL (ref 30.0–36.0)
MCV: 91.3 fL (ref 78.0–100.0)
Monocytes Absolute: 0.6 10*3/uL (ref 0.1–1.0)
Monocytes Relative: 7.2 % (ref 3.0–12.0)
Neutro Abs: 5.2 10*3/uL (ref 1.4–7.7)
Neutrophils Relative %: 67.3 % (ref 43.0–77.0)
Platelets: 202 10*3/uL (ref 150.0–400.0)
RBC: 4.43 Mil/uL (ref 3.87–5.11)
RDW: 13.7 % (ref 11.5–15.5)
WBC: 7.8 10*3/uL (ref 4.0–10.5)

## 2023-09-05 LAB — COMPREHENSIVE METABOLIC PANEL
ALT: 12 U/L (ref 0–35)
AST: 19 U/L (ref 0–37)
Albumin: 4.1 g/dL (ref 3.5–5.2)
Alkaline Phosphatase: 84 U/L (ref 39–117)
BUN: 11 mg/dL (ref 6–23)
CO2: 33 meq/L — ABNORMAL HIGH (ref 19–32)
Calcium: 9.4 mg/dL (ref 8.4–10.5)
Chloride: 103 meq/L (ref 96–112)
Creatinine, Ser: 0.74 mg/dL (ref 0.40–1.20)
GFR: 77.86 mL/min (ref >60.00)
Glucose, Bld: 119 mg/dL — ABNORMAL HIGH (ref 70–99)
Potassium: 4.2 meq/L (ref 3.5–5.1)
Sodium: 143 meq/L (ref 135–145)
Total Bilirubin: 0.4 mg/dL (ref 0.2–1.2)
Total Protein: 6.2 g/dL (ref 6.0–8.3)

## 2023-09-05 LAB — VITAMIN B12: Vitamin B-12: 1134 pg/mL — ABNORMAL HIGH (ref 211–911)

## 2023-09-05 LAB — TSH: TSH: 0.75 u[IU]/mL (ref 0.35–5.50)

## 2023-09-05 LAB — SEDIMENTATION RATE: Sed Rate: 21 mm/h (ref 0–30)

## 2023-09-05 MED ORDER — ESCITALOPRAM OXALATE 20 MG PO TABS: 20.0000 mg | ORAL_TABLET | Freq: Every day | ORAL | 3 refills | Status: DC

## 2023-09-05 NOTE — Assessment & Plan Note (Signed)
Chronic, presumed due to IBS-D history.  She has started IBGard with some benefit.  Avoid bentyl due to possible anticholinergic effect.  Recent possible worsening with ?night sweats and worsened mood. Update labs.

## 2023-09-05 NOTE — Assessment & Plan Note (Signed)
Overall better on aricept + B12 replacement - continue this.

## 2023-09-05 NOTE — Assessment & Plan Note (Signed)
Sertraline started ~2015 due to significant caregiver burden caring for husband with dementia. Increased to 100mg  ~2020. Husband subsequently passed away. Lower 50mg  dose not as effective. Possibly contributing to loose stools, GI upset - will change to Lexapro 20mg  daily, update with effect.

## 2023-09-05 NOTE — Patient Instructions (Addendum)
May continue OTC IBGard as needed.  Labs today  Change sertraline 100mg  to lexapro 20mg  daily.  Update [place] with effect.  Continue b12 replacement daily.

## 2023-09-05 NOTE — Assessment & Plan Note (Signed)
Update levels on oral replacement daily

## 2023-09-05 NOTE — Progress Notes (Signed)
Ph: [phone] Fax: [phone]   Patient ID: Beth Bailey, female    DOB: 02-16-1946, 77 y.o.   MRN: [id number]  This visit was conducted in person.  BP 138/70   Pulse 70   Temp 98 F (36.7 C) (Oral)   Ht 4\' 9"  (1.448 m)   Wt 115 lb 6 oz (52.3 kg)   SpO2 99%   BMI 24.97 kg/m    CC: diarrhea, night sweats  Subjective:   HPI: Beth Bailey is a 77 y.o. female presenting on 09/05/2023 for GI Problem (C/o diarrhea and night sweats. Denies nausea/vomiting. Sxs started yrs ago. Pt accompanied by daughter, Marcelino Duster. )   Years of intermittent loose stools, known IBS-D.  2-3 wks ago was very emotional (mood) and also had some night sweats (non-drenching).  Daughter concerned may get meds mixed up occasionally.  On sertraline 100mg  for a long time. Didn't do as well on trial lower 50mg  dose. No h/o heart arrhythmia.  Possible hair loss.  No fevers/chills, cough, heat or cold intolerance, skin changes, fatigue.  Known arthritis   Didn't try IBGard. Avoiding dicyclomine/bentyl due to possible anticholinergic effect.   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   B12 recently found low - started oral vit B12 daily OTC - tolerating this well.  Lab Results  Component Value Date   VITAMINB12 181 (L) 06/15/2023    COLONOSCOPY 04/2021 - 3 TAs, diverticulosis, rpt 3 yrs Leone Payor)      Relevant past medical, surgical, family and social history reviewed and updated as indicated. Interim medical history since our last visit reviewed. Allergies and medications reviewed and updated. Outpatient Medications Prior to Visit  Medication Sig Dispense Refill   atorvastatin (LIPITOR) 40 MG tablet TAKE 1 TABLET BY MOUTH DAILY AT 6 PM. 90 tablet 4   Biotin 5000 MCG CAPS Take 5,000 mcg by mouth 2 (two) times a week. As needed     Calcium Carbonate (CALCIUM 600 PO) Take 1 tablet by mouth daily.     cetirizine  (ZYRTEC) 10 MG tablet Take 10 mg by mouth at bedtime.      Cholecalciferol (VITAMIN D3) 25 MCG (1000 UT) CAPS Take 1 capsule (1,000 Units total) by mouth daily. 30 capsule    cyanocobalamin (VITAMIN B12) 1000 MCG tablet Take 1 tablet (1,000 mcg total) by mouth daily.     donepezil (ARICEPT) 10 MG tablet TAKE 1 TABLET BY MOUTH EVERYDAY AT BEDTIME 90 tablet 3   fluorometholone (FML) 0.1 % ophthalmic suspension Place 1 drop into the right eye daily.     HYDROcodone-acetaminophen (NORCO/VICODIN) 5-325 MG tablet TAKE 1 TABLET BY MOUTH EVERY 6 HOURS AS NEEDED FOR PAIN. 30 tablet 0   Lactase 9000 units TABS Take 1 tablet (9,000 Units total) by mouth 2 (two) times daily as needed.  0   loperamide (IMODIUM A-D) 2 MG tablet Take 1 tablet (2 mg total) by mouth daily as needed for diarrhea or loose stools.     losartan (COZAAR) 50 MG tablet Take 1 tablet (50 mg total) by mouth daily. 90 tablet 4   METAMUCIL FIBER PO Take by mouth as needed.     MISC NATURAL PRODUCTS PO Take by mouth as needed. Fast Acting Dairy Digestive supplement     Omega-3 Fatty Acids (FISH OIL PO) Take 1 capsule by mouth daily. OmegaXL - 2 capsule daily     omeprazole (PRILOSEC) 20 MG capsule TAKE 1  CAPSULE BY MOUTH EVERY DAY 90 capsule 4   Polyethyl Glycol-Propyl Glycol 0.4-0.3 % SOLN Place 1 drop into both eyes 3 (three) times daily as needed (for dry eyes).     valACYclovir (VALTREX) 1000 MG tablet Take 1 tablet (1,000 mg total) by mouth at bedtime. 90 tablet 4   sertraline (ZOLOFT) 100 MG tablet Take 1 tablet (100 mg total) by mouth daily. TAKE 1 TABLET BY MOUTH EVERY DAY 90 tablet 4   gabapentin (NEURONTIN) 300 MG capsule Take 1 capsule (300 mg total) by mouth 2 (two) times daily. (Patient not taking: Reported on 09/05/2023) 180 capsule 3   No facility-administered medications prior to visit.     Per HPI unless specifically indicated in ROS section below Review of Systems  Objective:  BP 138/70   Pulse 70   Temp 98 F (36.7  C) (Oral)   Ht 4\' 9"  (1.448 m)   Wt 115 lb 6 oz (52.3 kg)   SpO2 99%   BMI 24.97 kg/m   Wt Readings from Last 3 Encounters:  09/05/23 115 lb 6 oz (52.3 kg)  06/22/23 116 lb 4 oz (52.7 kg)  06/17/23 121 lb (54.9 kg)      Physical Exam Vitals and nursing note reviewed.  Constitutional:      Appearance: Normal appearance. She is not ill-appearing.  HENT:     Head: Normocephalic and atraumatic.     Mouth/Throat:     Mouth: Mucous membranes are moist.     Pharynx: Oropharynx is clear. No oropharyngeal exudate or posterior oropharyngeal erythema.  Eyes:     Extraocular Movements: Extraocular movements intact.     Conjunctiva/sclera: Conjunctivae normal.     Pupils: Pupils are equal, round, and reactive to light.  Neck:     Thyroid: No thyroid mass or thyromegaly.  Cardiovascular:     Rate and Rhythm: Normal rate and regular rhythm.     Pulses: Normal pulses.     Heart sounds: Normal heart sounds. No murmur heard. Pulmonary:     Effort: Pulmonary effort is normal. No respiratory distress.     Breath sounds: Normal breath sounds. No wheezing, rhonchi or rales.  Abdominal:     General: Bowel sounds are normal. There is no distension.     Palpations: Abdomen is soft. There is no mass.     Tenderness: There is no abdominal tenderness. There is no guarding or rebound.     Hernia: No hernia is present.  Musculoskeletal:     Right lower leg: No edema.     Left lower leg: No edema.  Skin:    General: Skin is warm and dry.     Findings: No rash.  Neurological:     Mental Status: She is alert.  Psychiatric:        Mood and Affect: Mood normal.        Behavior: Behavior normal.       Results for orders placed or performed in visit on 06/15/23  Vitamin B12  Result Value Ref Range   Vitamin B-12 181 (L) 211 - 911 pg/mL  VITAMIN D 25 Hydroxy (Vit-D Deficiency, Fractures)  Result Value Ref Range   VITD 44.11 30.00 - 100.00 ng/mL  Microalbumin / creatinine urine ratio  Result  Value Ref Range   Microalb, Ur 2.2 (H) 0.0 - 1.9 mg/dL   Creatinine,U 409.8 mg/dL   Microalb Creat Ratio 1.5 0.0 - 30.0 mg/g  Hemoglobin A1c  Result Value Ref Range   Hgb  A1c MFr Bld 6.7 (H) 4.6 - 6.5 %  Comprehensive metabolic panel  Result Value Ref Range   Sodium 145 135 - 145 mEq/L   Potassium 4.2 3.5 - 5.1 mEq/L   Chloride 105 96 - 112 mEq/L   CO2 32 19 - 32 mEq/L   Glucose, Bld 110 (H) 70 - 99 mg/dL   BUN 10 6 - 23 mg/dL   Creatinine, Ser 1.61 0.40 - 1.20 mg/dL   Total Bilirubin 0.5 0.2 - 1.2 mg/dL   Alkaline Phosphatase 85 39 - 117 U/L   AST 20 0 - 37 U/L   ALT 13 0 - 35 U/L   Total Protein 6.3 6.0 - 8.3 g/dL   Albumin 4.0 3.5 - 5.2 g/dL   GFR 09.60 >45.40 mL/min   Calcium 9.4 8.4 - 10.5 mg/dL  Lipid panel  Result Value Ref Range   Cholesterol 183 0 - 200 mg/dL   Triglycerides 981.1 0.0 - 149.0 mg/dL   HDL 91.47 >82.95 mg/dL   VLDL 62.1 0.0 - 30.8 mg/dL   LDL Cholesterol 657 (H) 0 - 99 mg/dL   Total CHOL/HDL Ratio 3    NonHDL 126.83    Lab Results  Component Value Date   WBC 6.0 09/08/2022   HGB 13.3 09/08/2022   HCT 39.1 09/08/2022   MCV 90.7 09/08/2022   PLT 215.0 09/08/2022    Lab Results  Component Value Date   TSH 0.87 02/13/2020       09/05/2023   11:48 AM 06/17/2023    2:06 PM 03/18/2023    2:32 PM 03/11/2022    3:27 PM 03/04/2021   11:20 AM  Depression screen PHQ 2/9  Decreased Interest 2 0 0 0 2  Down, Depressed, Hopeless 0 0 0 0 0  PHQ - 2 Score 2 0 0 0 2  Altered sleeping 2  0  0  Tired, decreased energy 0  1  0  Change in appetite 2  2  2   Feeling bad or failure about yourself  0  0  0  Trouble concentrating 1  0  1  Moving slowly or fidgety/restless 0  0  0  Suicidal thoughts 0  0  0  PHQ-9 Score 7  3  5   Difficult doing work/chores Somewhat difficult           09/05/2023   11:48 AM 03/18/2023    2:32 PM 03/04/2021   11:20 AM 12/26/2019    1:26 PM  GAD 7 : Generalized Anxiety Score  Nervous, Anxious, on Edge 1 0 1 2  Control/stop  worrying 1 0 0 0  Worry too much - different things 2 0 1 0  Trouble relaxing 0 0 0 1  Restless 0 1 1 0  Easily annoyed or irritable 0 0 0 2  Afraid - awful might happen 1 0 0 0  Total GAD 7 Score 5 1 3 5   Anxiety Difficulty Somewhat difficult      Assessment & Plan:   Problem List Items Addressed This Visit     Depressed mood    Sertraline started ~2015 due to significant caregiver burden caring for husband with dementia. Increased to 100mg  ~2020. Husband subsequently passed away. Lower 50mg  dose not as effective. Possibly contributing to loose stools, GI upset - will change to Lexapro 20mg  daily, update with effect.       Amnestic MCI (mild cognitive impairment with memory loss)    Overall better on aricept + B12 replacement -  continue this.       Loose stools - Primary    Chronic, presumed due to IBS-D history.  She has started IBGard with some benefit.  Avoid bentyl due to possible anticholinergic effect.  Recent possible worsening with ?night sweats and worsened mood. Update labs.       Relevant Orders   Comprehensive metabolic panel   TSH   CBC with Differential/Platelet   Sedimentation rate   Vitamin B12 deficiency    Update levels on oral replacement daily       Relevant Orders   Vitamin B12     Meds ordered this encounter  Medications   escitalopram (LEXAPRO) 20 MG tablet    Sig: Take 1 tablet (20 mg total) by mouth daily.    Dispense:  90 tablet    Refill:  3    To replace sertraline    Orders Placed This Encounter  Procedures   Comprehensive metabolic panel   TSH   CBC with Differential/Platelet   Vitamin B12   Sedimentation rate    Patient Instructions  May continue OTC IBGard as needed.  Labs today  Change sertraline 100mg  to lexapro 20mg  daily.  Update [place] with effect.  Continue b12 replacement daily.   Follow up plan: Return if symptoms worsen or fail to improve.  Eustaquio Boyden, MD

## 2023-09-07 ENCOUNTER — Other Ambulatory Visit: Payer: Self-pay | Admitting: Family Medicine

## 2023-09-07 MED ORDER — VITAMIN B-12 1000 MCG PO TABS: 1000.0000 ug | ORAL_TABLET | ORAL | Status: DC

## 2023-12-20 ENCOUNTER — Encounter: Payer: Self-pay | Admitting: Family Medicine

## 2023-12-20 ENCOUNTER — Ambulatory Visit: Payer: Medicare HMO | Admitting: Family Medicine

## 2023-12-20 VITALS — BP 122/64 | HR 68 | Temp 98.2°F | Ht <= 58 in | Wt 116.0 lb

## 2023-12-20 DIAGNOSIS — E119 Type 2 diabetes mellitus without complications: Secondary | ICD-10-CM

## 2023-12-20 DIAGNOSIS — K219 Gastro-esophageal reflux disease without esophagitis: Secondary | ICD-10-CM

## 2023-12-20 DIAGNOSIS — F418 Other specified anxiety disorders: Secondary | ICD-10-CM | POA: Diagnosis not present

## 2023-12-20 DIAGNOSIS — E538 Deficiency of other specified B group vitamins: Secondary | ICD-10-CM

## 2023-12-20 DIAGNOSIS — K589 Irritable bowel syndrome without diarrhea: Secondary | ICD-10-CM | POA: Diagnosis not present

## 2023-12-20 DIAGNOSIS — G3184 Mild cognitive impairment, so stated: Secondary | ICD-10-CM

## 2023-12-20 DIAGNOSIS — R195 Other fecal abnormalities: Secondary | ICD-10-CM

## 2023-12-20 LAB — POCT GLYCOSYLATED HEMOGLOBIN (HGB A1C): Hemoglobin A1C: 6.5 % — AB (ref 4.0–5.6)

## 2023-12-20 NOTE — Patient Instructions (Addendum)
 We will request latest diabetic eye exam from Dr Dione Booze.  Try IBGard for GI symptoms.  Trial lexapro in place of sertraline - update me with effect.  Sugars are doing well.  Good to see you today. Return in 6 months for physical/wellness visit  Return in 6 months for physical/wellness visit

## 2023-12-20 NOTE — Assessment & Plan Note (Signed)
 Continues vit B12 every other day.

## 2023-12-20 NOTE — Progress Notes (Signed)
 Ph: [phone] Fax: [phone]   Patient ID: Beth Bailey, female    DOB: 1945-12-19, 78 y.o.   MRN: [id number]  This visit was conducted in person.  BP 122/64   Pulse 68   Temp 98.2 F (36.8 C) (Oral)   Ht 4\' 9"  (1.448 m)   Wt 116 lb (52.6 kg)   SpO2 93%   BMI 25.10 kg/m    CC: 6 mo DM f/u visit  Subjective:   HPI: Beth Bailey is a 78 y.o. female presenting on 12/20/2023 for Medical Management of Chronic Issues (Her for 6 mo DM f/u. Pt accompanied by daughter, Marcelino Duster. )   Daughter Marcelino Duster lives next to her.   Had a fall 1 month ago - slipped off bottom step, lost balance, and hit L shin with large skin tear - this has healed well.   Last visit we changed SSRI from sertraline 100mg  to lexapro 20mg  daily - she decided not to try this, unsure what her concern was but it was over possible side effects. Has continued sertraline 100mg  daily. She and daughter note she's been more emotional recently. First cousin passed away over the weekend. She notes she's sleeping well.   DM - does not regularly check sugars. Compliant with antihyperglycemic regimen which includes: diet control. Denies low sugars or hypoglycemic symptoms. Denies paresthesias, blurry vision. Last diabetic eye exam - unsure, with Dr Dione Booze. Glucometer brand: doesn't have meter. Last foot exam: DUE. DSME: has not completed. Lab Results  Component Value Date   HGBA1C 6.5 (A) 12/20/2023   Diabetic Foot Exam - Simple   Simple Foot Form Diabetic Foot exam was performed with the following findings: Yes 12/20/2023  4:10 PM  Visual Inspection No deformities, no ulcerations, no other skin breakdown bilaterally: Yes Sensation Testing Intact to touch and monofilament testing bilaterally: Yes Pulse Check Posterior Tibialis and Dorsalis pulse intact bilaterally: Yes Comments No significant claudication    Lab Results  Component Value Date   MICROALBUR 2.2 (H) 06/15/2023        Relevant  past medical, surgical, family and social history reviewed and updated as indicated. Interim medical history since our last visit reviewed. Allergies and medications reviewed and updated. Outpatient Medications Prior to Visit  Medication Sig Dispense Refill   atorvastatin (LIPITOR) 40 MG tablet TAKE 1 TABLET BY MOUTH DAILY AT 6 PM. 90 tablet 4   Biotin 5000 MCG CAPS Take 5,000 mcg by mouth 2 (two) times a week. As needed     Calcium Carbonate (CALCIUM 600 PO) Take 1 tablet by mouth daily.     cetirizine (ZYRTEC) 10 MG tablet Take 10 mg by mouth at bedtime.      Cholecalciferol (VITAMIN D3) 25 MCG (1000 UT) CAPS Take 1 capsule (1,000 Units total) by mouth daily. 30 capsule    cyanocobalamin (VITAMIN B12) 1000 MCG tablet Take 1 tablet (1,000 mcg total) by mouth every other day.     donepezil (ARICEPT) 10 MG tablet TAKE 1 TABLET BY MOUTH EVERYDAY AT BEDTIME 90 tablet 3   fluorometholone (FML) 0.1 % ophthalmic suspension Place 1 drop into the right eye daily.     HYDROcodone-acetaminophen (NORCO/VICODIN) 5-325 MG tablet TAKE 1 TABLET BY MOUTH EVERY 6 HOURS AS NEEDED FOR PAIN. 30 tablet 0   Lactase 9000 units TABS Take 1 tablet (9,000 Units total) by mouth 2 (two) times daily as needed.  0   loperamide (IMODIUM A-D) 2 MG tablet Take 1 tablet (2 mg total)  by mouth daily as needed for diarrhea or loose stools.     losartan (COZAAR) 50 MG tablet Take 1 tablet (50 mg total) by mouth daily. 90 tablet 4   METAMUCIL FIBER PO Take by mouth as needed.     MISC NATURAL PRODUCTS PO Take by mouth as needed. Fast Acting Dairy Digestive supplement     Omega-3 Fatty Acids (FISH OIL PO) Take 1 capsule by mouth daily. OmegaXL - 2 capsule daily     omeprazole (PRILOSEC) 20 MG capsule TAKE 1 CAPSULE BY MOUTH EVERY DAY 90 capsule 4   Polyethyl Glycol-Propyl Glycol 0.4-0.3 % SOLN Place 1 drop into both eyes 3 (three) times daily as needed (for dry eyes).     valACYclovir (VALTREX) 1000 MG tablet Take 1 tablet (1,000 mg  total) by mouth at bedtime. 90 tablet 4   sertraline (ZOLOFT) 100 MG tablet Take 1 tablet (100 mg total) by mouth daily. TAKE 1 TABLET BY MOUTH EVERY DAY     escitalopram (LEXAPRO) 20 MG tablet Take 1 tablet (20 mg total) by mouth daily.     sertraline (ZOLOFT) 100 MG tablet Take 1 tablet (100 mg total) by mouth daily.     escitalopram (LEXAPRO) 20 MG tablet Take 1 tablet (20 mg total) by mouth daily. (Patient not taking: Reported on 12/20/2023) 90 tablet 3   No facility-administered medications prior to visit.     Per HPI unless specifically indicated in ROS section below Review of Systems  Objective:  BP 122/64   Pulse 68   Temp 98.2 F (36.8 C) (Oral)   Ht 4\' 9"  (1.448 m)   Wt 116 lb (52.6 kg)   SpO2 93%   BMI 25.10 kg/m   Wt Readings from Last 3 Encounters:  12/20/23 116 lb (52.6 kg)  09/05/23 115 lb 6 oz (52.3 kg)  06/22/23 116 lb 4 oz (52.7 kg)      Physical Exam Vitals and nursing note reviewed.  Constitutional:      Appearance: Normal appearance. She is not ill-appearing.  HENT:     Head: Normocephalic and atraumatic.     Mouth/Throat:     Mouth: Mucous membranes are moist.     Pharynx: Oropharynx is clear. No oropharyngeal exudate or posterior oropharyngeal erythema.  Eyes:     Extraocular Movements: Extraocular movements intact.     Conjunctiva/sclera: Conjunctivae normal.     Pupils: Pupils are equal, round, and reactive to light.  Cardiovascular:     Rate and Rhythm: Normal rate and regular rhythm.     Pulses: Normal pulses.     Heart sounds: Normal heart sounds. No murmur heard. Pulmonary:     Effort: Pulmonary effort is normal. No respiratory distress.     Breath sounds: Normal breath sounds. No wheezing, rhonchi or rales.  Musculoskeletal:     Right lower leg: No edema.     Left lower leg: No edema.  Skin:    General: Skin is warm and dry.     Findings: No rash.  Neurological:     Mental Status: She is alert.  Psychiatric:        Mood and Affect:  Mood normal.        Behavior: Behavior normal.       Results for orders placed or performed in visit on 12/20/23  POCT glycosylated hemoglobin (Hb A1C)   Collection Time: 12/20/23  3:44 PM  Result Value Ref Range   Hemoglobin A1C 6.5 (A) 4.0 - 5.6 %  HbA1c POC (<> result, manual entry)     HbA1c, POC (prediabetic range)     HbA1c, POC (controlled diabetic range)     Lab Results  Component Value Date   VITAMINB12 1,134 (H) 09/05/2023    Assessment & Plan:   Problem List Items Addressed This Visit     Diet-controlled diabetes mellitus (HCC) - Primary   Chronic, remains diet controlled off medication. Will request latest diabetic eye exam ([facility]).  Foot exam performed today.       Relevant Orders   POCT glycosylated hemoglobin (Hb A1C) (Completed)   GERD   She continues omeprazole 20mg  daily with benefit.       IBS (irritable bowel syndrome)   Will retry IBgard - unsure if this was helpful.       Depression with anxiety   On sertraline since 2015, on 100mg  daily since 2020.  Concern this may be contributing to GI symptoms - loose stools, GI upset.  She previously decided not to try lexapro - due to concerns over possible side effects She agrees to try lexapro.       Relevant Medications   escitalopram (LEXAPRO) 20 MG tablet   sertraline (ZOLOFT) 100 MG tablet   Amnestic MCI (mild cognitive impairment with memory loss)   Continue aricept 10mg  nightly.       Loose stools   In h/o IBS-D Also on SSRI - will trial different SSRI.       Vitamin B12 deficiency   Continues vit B12 every other day.         No orders of the defined types were placed in this encounter.   Orders Placed This Encounter  Procedures   POCT glycosylated hemoglobin (Hb A1C)    Patient Instructions  We will request latest diabetic eye exam from Dr Dione Booze.  Try IBGard for GI symptoms.  Trial lexapro in place of sertraline - update me with effect.  Sugars are doing well.   Good to see you today. Return in 6 months for physical/wellness visit  Return in 6 months for physical/wellness visit   Follow up plan: Return in about 6 months (around 06/21/2024), or if symptoms worsen or fail to improve, for annual exam, prior fasting for blood work, medicare wellness visit.  Eustaquio Boyden, MD

## 2023-12-20 NOTE — Assessment & Plan Note (Signed)
 In h/o IBS-D Also on SSRI - will trial different SSRI.

## 2023-12-20 NOTE — Assessment & Plan Note (Addendum)
 Will retry IBgard - unsure if this was helpful.

## 2023-12-20 NOTE — Assessment & Plan Note (Signed)
 She continues omeprazole 20mg  daily with benefit.

## 2023-12-20 NOTE — Assessment & Plan Note (Addendum)
 Chronic, remains diet controlled off medication. Will request latest diabetic eye exam ([facility]).  Foot exam performed today.

## 2023-12-20 NOTE — Assessment & Plan Note (Signed)
 On sertraline since 2015, on 100mg  daily since 2020.  Concern this may be contributing to GI symptoms - loose stools, GI upset.  She previously decided not to try lexapro - due to concerns over possible side effects She agrees to try lexapro.

## 2023-12-20 NOTE — Assessment & Plan Note (Signed)
 Continue aricept 10mg  nightly.

## 2024-04-16 ENCOUNTER — Encounter: Payer: Self-pay | Admitting: Family Medicine

## 2024-04-16 DIAGNOSIS — F418 Other specified anxiety disorders: Secondary | ICD-10-CM

## 2024-04-16 MED ORDER — SERTRALINE HCL 100 MG PO TABS: 100.0000 mg | ORAL_TABLET | Freq: Every day | ORAL | 0 refills | Status: DC

## 2024-04-16 NOTE — Telephone Encounter (Signed)
 E-scribed refill

## 2024-04-17 ENCOUNTER — Other Ambulatory Visit: Payer: Self-pay | Admitting: Family Medicine

## 2024-04-17 NOTE — Telephone Encounter (Signed)
 Lexapro  Last OV:  12/20/23, 6 mo DM f/u Next OV:  06/26/24, CPE

## 2024-04-17 NOTE — Telephone Encounter (Unsigned)
 Copied from CRM [id number]. Topic: Clinical - Medication Refill >> Apr 17, 2024 10:11 AM Sophia H wrote: Medication: escitalopram  (LEXAPRO ) 20 MG tablet  Has the patient contacted their pharmacy? Yes, told to contact office   This is the patient's preferred pharmacy:  [id number] [place] - [address] [place] [address] Phone: [phone] Fax: [phone]  Is this the correct pharmacy for this prescription? Yes If no, delete pharmacy and type the correct one.   Has the prescription been filled recently? Yes  Is the patient out of the medication? No, has 2 left.   Has the patient been seen for an appointment in the last year OR does the patient have an upcoming appointment? Yes, has appt scheduled for 09/16.   Can we respond through MyChart? Yes  Agent: Please be advised that Rx refills may take up to 3 business days. We ask that you follow-up with your pharmacy.

## 2024-04-18 NOTE — Telephone Encounter (Signed)
 Duplicate request (see other 04/17/24 refill note).

## 2024-04-19 MED ORDER — ESCITALOPRAM OXALATE 20 MG PO TABS: 20.0000 mg | ORAL_TABLET | Freq: Every day | ORAL | 0 refills | Status: DC

## 2024-04-19 NOTE — Addendum Note (Signed)
 Addended by: RILLA BALLER on: 04/19/2024 12:47 PM   Modules accepted: Orders

## 2024-04-19 NOTE — Telephone Encounter (Signed)
 Please clarify with patient - she has both escitalopram  and sertraline  on her med list. Last visit we switched sertraline  to escitalopram . She should only be on escitalopram  but sertraline  was last refilled.  I have sent in 90d supply of escitalopram  but ensure she's not taking both.

## 2024-04-19 NOTE — Telephone Encounter (Addendum)
 Spoke with pt to clarify which med she's taking. Pt states she not sure but thinks it's the Lexapro . Says she and her daughter, Rosaline, helps her with her meds and they were talking about Lexapro . I relayed Dr Talmadge message. Pt verbalizes understanding, will confirm with Rosaline and let us  know for sure. Fyi to Dr KANDICE.

## 2024-04-19 NOTE — Telephone Encounter (Signed)
 See my chart message

## 2024-06-16 ENCOUNTER — Other Ambulatory Visit: Payer: Self-pay | Admitting: Family Medicine

## 2024-06-16 DIAGNOSIS — E119 Type 2 diabetes mellitus without complications: Secondary | ICD-10-CM

## 2024-06-16 DIAGNOSIS — G3184 Mild cognitive impairment, so stated: Secondary | ICD-10-CM

## 2024-06-16 DIAGNOSIS — E7849 Other hyperlipidemia: Secondary | ICD-10-CM

## 2024-06-16 DIAGNOSIS — M85852 Other specified disorders of bone density and structure, left thigh: Secondary | ICD-10-CM

## 2024-06-16 DIAGNOSIS — K760 Fatty (change of) liver, not elsewhere classified: Secondary | ICD-10-CM

## 2024-06-16 DIAGNOSIS — E538 Deficiency of other specified B group vitamins: Secondary | ICD-10-CM

## 2024-06-19 ENCOUNTER — Other Ambulatory Visit (INDEPENDENT_AMBULATORY_CARE_PROVIDER_SITE_OTHER)

## 2024-06-19 DIAGNOSIS — E119 Type 2 diabetes mellitus without complications: Secondary | ICD-10-CM

## 2024-06-19 DIAGNOSIS — M85852 Other specified disorders of bone density and structure, left thigh: Secondary | ICD-10-CM | POA: Diagnosis not present

## 2024-06-19 DIAGNOSIS — K760 Fatty (change of) liver, not elsewhere classified: Secondary | ICD-10-CM

## 2024-06-19 DIAGNOSIS — G3184 Mild cognitive impairment, so stated: Secondary | ICD-10-CM | POA: Diagnosis not present

## 2024-06-19 DIAGNOSIS — E538 Deficiency of other specified B group vitamins: Secondary | ICD-10-CM | POA: Diagnosis not present

## 2024-06-19 DIAGNOSIS — E7849 Other hyperlipidemia: Secondary | ICD-10-CM | POA: Diagnosis not present

## 2024-06-19 LAB — CBC WITH DIFFERENTIAL/PLATELET
Basophils Absolute: 0.1 K/uL (ref 0.0–0.1)
Basophils Relative: 1.1 % (ref 0.0–3.0)
Eosinophils Absolute: 0.2 K/uL (ref 0.0–0.7)
Eosinophils Relative: 3.9 % (ref 0.0–5.0)
HCT: 38.6 % (ref 36.0–46.0)
Hemoglobin: 12.7 g/dL (ref 12.0–15.0)
Lymphocytes Relative: 37.6 % (ref 12.0–46.0)
Lymphs Abs: 1.9 K/uL (ref 0.7–4.0)
MCHC: 32.8 g/dL (ref 30.0–36.0)
MCV: 89.3 fl (ref 78.0–100.0)
Monocytes Absolute: 0.4 K/uL (ref 0.1–1.0)
Monocytes Relative: 7.9 % (ref 3.0–12.0)
Neutro Abs: 2.5 K/uL (ref 1.4–7.7)
Neutrophils Relative %: 49.5 % (ref 43.0–77.0)
Platelets: 146 K/uL — ABNORMAL LOW (ref 150.0–400.0)
RBC: 4.33 Mil/uL (ref 3.87–5.11)
RDW: 14.8 % (ref 11.5–15.5)
WBC: 5 K/uL (ref 4.0–10.5)

## 2024-06-19 LAB — LIPID PANEL
Cholesterol: 159 mg/dL (ref 0–200)
HDL: 53.5 mg/dL (ref >39.00)
LDL Cholesterol: 83 mg/dL (ref 0–99)
NonHDL: 105.72
Total CHOL/HDL Ratio: 3
Triglycerides: 115 mg/dL (ref 0.0–149.0)
VLDL: 23 mg/dL (ref 0.0–40.0)

## 2024-06-19 LAB — COMPREHENSIVE METABOLIC PANEL WITH GFR
ALT: 13 U/L (ref 0–35)
AST: 20 U/L (ref 0–37)
Albumin: 4.1 g/dL (ref 3.5–5.2)
Alkaline Phosphatase: 76 U/L (ref 39–117)
BUN: 13 mg/dL (ref 6–23)
CO2: 32 meq/L (ref 19–32)
Calcium: 9.4 mg/dL (ref 8.4–10.5)
Chloride: 102 meq/L (ref 96–112)
Creatinine, Ser: 0.76 mg/dL (ref 0.40–1.20)
GFR: 74.99 mL/min (ref >60.00)
Glucose, Bld: 104 mg/dL — ABNORMAL HIGH (ref 70–99)
Potassium: 4.2 meq/L (ref 3.5–5.1)
Sodium: 140 meq/L (ref 135–145)
Total Bilirubin: 0.7 mg/dL (ref 0.2–1.2)
Total Protein: 6.1 g/dL (ref 6.0–8.3)

## 2024-06-19 LAB — VITAMIN B12: Vitamin B-12: 1202 pg/mL — ABNORMAL HIGH (ref 211–911)

## 2024-06-19 LAB — MICROALBUMIN / CREATININE URINE RATIO
Creatinine,U: 112.8 mg/dL
Microalb Creat Ratio: 86.7 mg/g — ABNORMAL HIGH (ref 0.0–30.0)
Microalb, Ur: 9.8 mg/dL — ABNORMAL HIGH (ref 0.0–1.9)

## 2024-06-19 LAB — VITAMIN D 25 HYDROXY (VIT D DEFICIENCY, FRACTURES): VITD: 70.25 ng/mL (ref 30.00–100.00)

## 2024-06-19 LAB — TSH: TSH: 3.19 u[IU]/mL (ref 0.35–5.50)

## 2024-06-19 LAB — HEMOGLOBIN A1C: Hgb A1c MFr Bld: 7 % — ABNORMAL HIGH (ref 4.6–6.5)

## 2024-06-20 ENCOUNTER — Ambulatory Visit: Payer: Self-pay | Admitting: Family Medicine

## 2024-06-26 ENCOUNTER — Ambulatory Visit (INDEPENDENT_AMBULATORY_CARE_PROVIDER_SITE_OTHER): Admitting: Family Medicine

## 2024-06-26 ENCOUNTER — Encounter: Payer: Self-pay | Admitting: Family Medicine

## 2024-06-26 VITALS — BP 138/82 | HR 64 | Temp 98.3°F | Ht <= 58 in | Wt 120.4 lb

## 2024-06-26 DIAGNOSIS — K580 Irritable bowel syndrome with diarrhea: Secondary | ICD-10-CM | POA: Diagnosis not present

## 2024-06-26 DIAGNOSIS — Z7189 Other specified counseling: Secondary | ICD-10-CM

## 2024-06-26 DIAGNOSIS — F418 Other specified anxiety disorders: Secondary | ICD-10-CM

## 2024-06-26 DIAGNOSIS — E538 Deficiency of other specified B group vitamins: Secondary | ICD-10-CM

## 2024-06-26 DIAGNOSIS — G3184 Mild cognitive impairment, so stated: Secondary | ICD-10-CM | POA: Diagnosis not present

## 2024-06-26 DIAGNOSIS — E7849 Other hyperlipidemia: Secondary | ICD-10-CM

## 2024-06-26 DIAGNOSIS — M85852 Other specified disorders of bone density and structure, left thigh: Secondary | ICD-10-CM

## 2024-06-26 DIAGNOSIS — E119 Type 2 diabetes mellitus without complications: Secondary | ICD-10-CM

## 2024-06-26 DIAGNOSIS — B0050 Herpesviral ocular disease, unspecified: Secondary | ICD-10-CM | POA: Diagnosis not present

## 2024-06-26 DIAGNOSIS — K219 Gastro-esophageal reflux disease without esophagitis: Secondary | ICD-10-CM

## 2024-06-26 DIAGNOSIS — D696 Thrombocytopenia, unspecified: Secondary | ICD-10-CM

## 2024-06-26 DIAGNOSIS — Z0001 Encounter for general adult medical examination with abnormal findings: Secondary | ICD-10-CM

## 2024-06-26 DIAGNOSIS — E1169 Type 2 diabetes mellitus with other specified complication: Secondary | ICD-10-CM

## 2024-06-26 DIAGNOSIS — I10 Essential (primary) hypertension: Secondary | ICD-10-CM | POA: Diagnosis not present

## 2024-06-26 DIAGNOSIS — Z0000 Encounter for general adult medical examination without abnormal findings: Secondary | ICD-10-CM

## 2024-06-26 DIAGNOSIS — M150 Primary generalized (osteo)arthritis: Secondary | ICD-10-CM | POA: Diagnosis not present

## 2024-06-26 DIAGNOSIS — Z1211 Encounter for screening for malignant neoplasm of colon: Secondary | ICD-10-CM

## 2024-06-26 DIAGNOSIS — Z85820 Personal history of malignant melanoma of skin: Secondary | ICD-10-CM

## 2024-06-26 NOTE — Progress Notes (Unsigned)
 Ph: [phone] Fax: [phone]   Patient ID: Beth Bailey, female    DOB: [birth year], 78 y.o.   MRN: [id number]  This visit was conducted in person.  BP 138/82   Pulse 64   Temp 98.3 F (36.8 C) (Oral)   Ht 4' 9 (1.448 m)   Wt 120 lb 6 oz (54.6 kg)   SpO2 95%   BMI 26.05 kg/m    CC: CPE/AMW Subjective:   HPI: Beth Bailey is a 78 y.o. female presenting on 06/26/2024 for Annual Exam Here with daughter Beth who lives next to her.   Pending AMW scheduled tomorrow am phone call - they request this be completed today.   No results found.  Flowsheet Row Office Visit from 06/26/2024 in [facility] [name]  PHQ-2 Total Score 0       06/26/2024    4:14 PM 12/20/2023    4:19 PM 09/05/2023   11:48 AM 06/17/2023    2:03 PM 03/11/2022    3:26 PM  Fall Risk   Falls in the past year? 0 1 1 0 1  Comment     tripped over things  Number falls in past yr: 0 1 1 0 1  Injury with Fall? 0  0 0 0  Risk for fall due to : Other (Comment)   No Fall Risks Medication side effect  Follow up Falls evaluation completed   Falls prevention discussed Falls evaluation completed;Education provided;Falls prevention discussed      Data saved with a previous flowsheet row definition  No falls since last seen.   Husband passed away 2019-12-30. Mother passed away Dec 15, 2020 after her 98th birthday. First cousin passed away 2024-01-13.   Recent dx amnestic MCI vs dementia - on aricept  10mg  daily since 08/12/2022.   05/2022: Mental Status Exam (MMSE): 22/30 (value/max value), CDT Score:1/4    Sees Dr Avanell PM&R for chronic back pain. She is on gabapentin  300mg  bid, norco 5/325mg  BID PRN, flexeril  5mg  PRN.    She also takes valtrex  1000mg  nightly preventatively after R corneal transplant surgery at [facility] (2012). Planning to re establish with Dr Octavia or [facility].    Poor diet, likes sugar/candy. Notes frequent loose stools.    Preventative: COLONOSCOPY  10/2010 - polyp, mild divertic, int hem, rec rpt 5 yrs given fmhx Ollen)  COLONOSCOPY 04/2021 - 3 TAs, diverticulosis, rpt 3 yrs Ollen).  Discussed options - will start with Cologuard this year.  Last well woman with prior PCP 2013, s/p hysterectomy 1985, ovaries remain. Denies pelvic pain or pressure. No vaginal/vulvar skin changes.  [facility]  Dexa - January 13, 2015 osteopenia T -1.5 at hip.  DEXA 01-12-2022 - T -1.8 LFN, elevated fracture risk (any 23%, hip 13%) - will order repeat  Lung cancer screening - not eligible  Flu - declines  COVID vaccine Pfizer 2019-12-16, Jan 13, 2020, prevnar-20 - discussed, declined Prevnar-13 08/2014. Pneumovax 05/2016   Pfizer covid vaccine series completed Jan 13, 2020  Tetanus - unsure  Shingrix - 03/2021, 06/2022  Advanced directive - scanned into chart 05/2022. Son Beth and daughter Beth Bailey are HCPOA. Does not want prolonged life support if terminal condition. Advanced directive overrides HCPOA.  Seat belt use discussed.  Sunscreen use discussed. No changing moles on skin. Overdue for derm f/u in h/o MM.  Non smoker  Alcohol - none  Dentist - yearly - has upper dentures  Eye exam yearly ([facility]) - h/o R corneal transplant  Bowel - no constipation  Bladder - no incontinence   Widow - husband with dementia passed away 01-16-20 One daughter - neighbor Occupation: retired, worked at [employer]   Activity: no regular exercise, but walking regularly - has membership to [employer] Diet: fruits/vegetables daily, good water     Relevant past medical, surgical, family and social history reviewed and updated as indicated. Interim medical history since our last visit reviewed. Allergies and medications reviewed and updated. Outpatient Medications Prior to Visit  Medication Sig Dispense Refill   atorvastatin  (LIPITOR) 40 MG tablet TAKE 1 TABLET BY MOUTH DAILY AT 6 PM. 90 tablet 4   Calcium  Carbonate (CALCIUM  600 PO) Take 1 tablet by  mouth daily.     cetirizine (ZYRTEC) 10 MG tablet Take 10 mg by mouth at bedtime.      Cholecalciferol (VITAMIN D3) 25 MCG (1000 UT) CAPS Take 1 capsule (1,000 Units total) by mouth daily. 30 capsule    cyanocobalamin  (VITAMIN B12) 1000 MCG tablet Take 1 tablet (1,000 mcg total) by mouth every other day.     donepezil  (ARICEPT ) 10 MG tablet TAKE 1 TABLET BY MOUTH EVERYDAY AT BEDTIME 90 tablet 3   escitalopram  (LEXAPRO ) 20 MG tablet Take 1 tablet (20 mg total) by mouth daily. 90 tablet 0   fluorometholone (FML) 0.1 % ophthalmic suspension Place 1 drop into the right eye daily.     HYDROcodone -acetaminophen  (NORCO/VICODIN) 5-325 MG tablet TAKE 1 TABLET BY MOUTH EVERY 6 HOURS AS NEEDED FOR PAIN. 30 tablet 0   Lactase 9000 units TABS Take 1 tablet (9,000 Units total) by mouth 2 (two) times daily as needed.  0   loperamide (IMODIUM A-D) 2 MG tablet Take 1 tablet (2 mg total) by mouth daily as needed for diarrhea or loose stools.     losartan  (COZAAR ) 50 MG tablet Take 1 tablet (50 mg total) by mouth daily. 90 tablet 4   METAMUCIL FIBER PO Take by mouth as needed.     MISC NATURAL PRODUCTS PO Take by mouth as needed. Fast Acting Dairy Digestive supplement     Omega-3 Fatty Acids (FISH OIL PO) Take 1 capsule by mouth daily. OmegaXL - 2 capsule daily     omeprazole  (PRILOSEC) 20 MG capsule TAKE 1 CAPSULE BY MOUTH EVERY DAY 90 capsule 4   Polyethyl Glycol-Propyl Glycol 0.4-0.3 % SOLN Place 1 drop into both eyes 3 (three) times daily as needed (for dry eyes).     valACYclovir  (VALTREX ) 1000 MG tablet Take 1 tablet (1,000 mg total) by mouth at bedtime. 90 tablet 4   Biotin 5000 MCG CAPS Take 5,000 mcg by mouth 2 (two) times a week. As needed (Patient not taking: Reported on 06/26/2024)     No facility-administered medications prior to visit.     Per HPI unless specifically indicated in ROS section below Review of Systems  Constitutional:  Negative for activity change, appetite change, chills, fatigue,  fever and unexpected weight change.  HENT:  Negative for hearing loss.   Eyes:  Negative for visual disturbance.  Respiratory:  Negative for cough, chest tightness, shortness of breath and wheezing.   Cardiovascular:  Negative for chest pain, palpitations and leg swelling.  Gastrointestinal:  Positive for abdominal pain and diarrhea. Negative for abdominal distention, blood in stool, constipation, nausea and vomiting.  Genitourinary:  Negative for difficulty urinating and hematuria.  Musculoskeletal:  Negative for arthralgias, myalgias and neck pain.  Skin:  Negative for rash.  Neurological:  Negative for dizziness,  seizures, syncope and headaches.  Hematological:  Negative for adenopathy. Does not bruise/bleed easily.  Psychiatric/Behavioral:  Negative for dysphoric mood. The patient is not nervous/anxious.     Objective:  BP 138/82   Pulse 64   Temp 98.3 F (36.8 C) (Oral)   Ht 4' 9 (1.448 m)   Wt 120 lb 6 oz (54.6 kg)   SpO2 95%   BMI 26.05 kg/m   Wt Readings from Last 3 Encounters:  06/26/24 120 lb 6 oz (54.6 kg)  12/20/23 116 lb (52.6 kg)  09/05/23 115 lb 6 oz (52.3 kg)      Physical Exam Vitals and nursing note reviewed.  Constitutional:      Appearance: Normal appearance. She is not ill-appearing.  HENT:     Head: Normocephalic and atraumatic.     Right Ear: Tympanic membrane, ear canal and external ear normal. There is no impacted cerumen.     Left Ear: Tympanic membrane, ear canal and external ear normal. There is no impacted cerumen.     Mouth/Throat:     Mouth: Mucous membranes are moist.     Pharynx: Oropharynx is clear. No oropharyngeal exudate or posterior oropharyngeal erythema.  Eyes:     General:        Right eye: No discharge.        Left eye: No discharge.     Extraocular Movements: Extraocular movements intact.     Conjunctiva/sclera: Conjunctivae normal.     Pupils: Pupils are equal, round, and reactive to light.  Neck:     Thyroid : No thyroid   mass or thyromegaly.     Vascular: No carotid bruit.  Cardiovascular:     Rate and Rhythm: Normal rate and regular rhythm.     Pulses: Normal pulses.     Heart sounds: Normal heart sounds. No murmur heard. Pulmonary:     Effort: Pulmonary effort is normal. No respiratory distress.     Breath sounds: Normal breath sounds. No wheezing, rhonchi or rales.  Abdominal:     General: Bowel sounds are normal. There is no distension.     Palpations: Abdomen is soft. There is no mass.     Tenderness: There is no abdominal tenderness. There is no guarding or rebound.     Hernia: No hernia is present.  Musculoskeletal:        General: Deformity present.     Cervical back: Normal range of motion and neck supple. No rigidity.     Right lower leg: No edema.     Left lower leg: No edema.     Comments:  Nodules to DIPs predominantly L index Positive finkelstein on left - with tender swelling present to radial wrist   Lymphadenopathy:     Cervical: No cervical adenopathy.  Skin:    General: Skin is warm and dry.     Findings: No rash.  Neurological:     General: No focal deficit present.     Mental Status: She is alert. Mental status is at baseline.  Psychiatric:        Mood and Affect: Mood normal.        Behavior: Behavior normal.       Results for orders placed or performed in visit on 06/19/24  TSH   Collection Time: 06/19/24  8:58 AM  Result Value Ref Range   TSH 3.19 0.35 - 5.50 uIU/mL  CBC with Differential/Platelet   Collection Time: 06/19/24  8:58 AM  Result Value Ref Range  WBC 5.0 4.0 - 10.5 K/uL   RBC 4.33 3.87 - 5.11 Mil/uL   Hemoglobin 12.7 12.0 - 15.0 g/dL   HCT 61.3 63.9 - 53.9 %   MCV 89.3 78.0 - 100.0 fl   MCHC 32.8 30.0 - 36.0 g/dL   RDW 85.1 88.4 - 84.4 %   Platelets 146.0 (L) 150.0 - 400.0 K/uL   Neutrophils Relative % 49.5 43.0 - 77.0 %   Lymphocytes Relative 37.6 12.0 - 46.0 %   Monocytes Relative 7.9 3.0 - 12.0 %   Eosinophils Relative 3.9 0.0 - 5.0 %    Basophils Relative 1.1 0.0 - 3.0 %   Neutro Abs 2.5 1.4 - 7.7 K/uL   Lymphs Abs 1.9 0.7 - 4.0 K/uL   Monocytes Absolute 0.4 0.1 - 1.0 K/uL   Eosinophils Absolute 0.2 0.0 - 0.7 K/uL   Basophils Absolute 0.1 0.0 - 0.1 K/uL  VITAMIN D  25 Hydroxy (Vit-D Deficiency, Fractures)   Collection Time: 06/19/24  8:58 AM  Result Value Ref Range   VITD 70.25 30.00 - 100.00 ng/mL  Vitamin B12   Collection Time: 06/19/24  8:58 AM  Result Value Ref Range   Vitamin B-12 1,202 (H) 211 - 911 pg/mL  Comprehensive metabolic panel with GFR   Collection Time: 06/19/24  8:58 AM  Result Value Ref Range   Sodium 140 135 - 145 mEq/L   Potassium 4.2 3.5 - 5.1 mEq/L   Chloride 102 96 - 112 mEq/L   CO2 32 19 - 32 mEq/L   Glucose, Bld 104 (H) 70 - 99 mg/dL   BUN 13 6 - 23 mg/dL   Creatinine, Ser 9.23 0.40 - 1.20 mg/dL   Total Bilirubin 0.7 0.2 - 1.2 mg/dL   Alkaline Phosphatase 76 39 - 117 U/L   AST 20 0 - 37 U/L   ALT 13 0 - 35 U/L   Total Protein 6.1 6.0 - 8.3 g/dL   Albumin 4.1 3.5 - 5.2 g/dL   GFR 25.00 >39.99 mL/min   Calcium  9.4 8.4 - 10.5 mg/dL  Lipid panel   Collection Time: 06/19/24  8:58 AM  Result Value Ref Range   Cholesterol 159 0 - 200 mg/dL   Triglycerides 884.9 0.0 - 149.0 mg/dL   HDL 46.49 >60.99 mg/dL   VLDL 76.9 0.0 - 59.9 mg/dL   LDL Cholesterol 83 0 - 99 mg/dL   Total CHOL/HDL Ratio 3    NonHDL 105.72   Microalbumin / creatinine urine ratio   Collection Time: 06/19/24  8:58 AM  Result Value Ref Range   Microalb, Ur 9.8 (H) 0.0 - 1.9 mg/dL   Creatinine,U 887.1 mg/dL   Microalb Creat Ratio 86.7 (H) 0.0 - 30.0 mg/g  Hemoglobin A1c   Collection Time: 06/19/24  8:58 AM  Result Value Ref Range   Hgb A1c MFr Bld 7.0 (H) 4.6 - 6.5 %    Assessment & Plan:   Problem List Items Addressed This Visit     Medicare annual wellness visit, subsequent - Primary (Chronic)   Advanced care planning/counseling discussion (Chronic)   Encounter for general adult medical examination with  abnormal findings (Chronic)   Osteopenia   Relevant Orders   DG Bone Density   History of malignant melanoma of skin   Relevant Orders   Ambulatory referral to Dermatology   Other Visit Diagnoses       Special screening for malignant neoplasms, colon       Relevant Orders   Cologuard  No orders of the defined types were placed in this encounter.   Orders Placed This Encounter  Procedures   DG Bone Density    Standing Status:   Future    Expiration Date:   06/26/2025    Reason for Exam (SYMPTOM  OR DIAGNOSIS REQUIRED):   osteopenia, at increased fracture risk    Preferred imaging location?:   [address]   Cologuard   Ambulatory referral to Dermatology    Referral Priority:   Routine    Referral Type:   Consultation    Referral Reason:   Specialty Services Required    Requested Specialty:   Dermatology    Number of Visits Requested:   1    Patient Instructions  We will cancel medicare wellness appointment tomorrow.  I will order cologuard  Call to schedule mammogram at your convenience: [facility] [phone] Call to to schedule bone density scan: [facility] [phone] We will refer you to dermatology in history of melanoma.  May use topical voltaren gel for wrist pain - may use wrist brace to keep wrist in neutral position.  Good to see you today Return in 4-6 months for follow up visit with labs   Follow up plan: Return in about 4 months (around 10/26/2024) for follow up visit.  Anton Blas, MD

## 2024-06-26 NOTE — Patient Instructions (Addendum)
 We will cancel medicare wellness appointment tomorrow.  I will order cologuard  Call to schedule mammogram at your convenience: [facility] [phone] Call to to schedule bone density scan: [facility] [phone] We will refer you to dermatology in history of melanoma.  May use topical voltaren gel for wrist pain - may use wrist brace to keep wrist in neutral position.  Good to see you today Return in 4-6 months for follow up visit with labs

## 2024-06-27 DIAGNOSIS — D696 Thrombocytopenia, unspecified: Secondary | ICD-10-CM | POA: Insufficient documentation

## 2024-06-27 MED ORDER — VITAMIN B-12 1000 MCG PO TABS: 1000.0000 ug | ORAL_TABLET | ORAL | Status: AC

## 2024-06-27 MED ORDER — VALACYCLOVIR HCL 1 G PO TABS: 1000.0000 mg | ORAL_TABLET | Freq: Every day | ORAL | 3 refills | Status: AC

## 2024-06-27 MED ORDER — LOSARTAN POTASSIUM 50 MG PO TABS: 50.0000 mg | ORAL_TABLET | Freq: Every day | ORAL | 3 refills | Status: AC

## 2024-06-27 MED ORDER — OMEPRAZOLE 20 MG PO CPDR: 20.0000 mg | DELAYED_RELEASE_CAPSULE | Freq: Every day | ORAL | 3 refills | Status: AC

## 2024-06-27 MED ORDER — ESCITALOPRAM OXALATE 20 MG PO TABS: 20.0000 mg | ORAL_TABLET | Freq: Every day | ORAL | 3 refills | Status: AC

## 2024-06-27 MED ORDER — DONEPEZIL HCL 10 MG PO TABS: 10.0000 mg | ORAL_TABLET | Freq: Every day | ORAL | 3 refills | Status: AC

## 2024-06-27 MED ORDER — ATORVASTATIN CALCIUM 40 MG PO TABS: ORAL_TABLET | ORAL | 3 refills | Status: AC

## 2024-06-27 NOTE — Assessment & Plan Note (Signed)
 Rec vit B12 MWF as levels were high.

## 2024-06-27 NOTE — Assessment & Plan Note (Addendum)
 Previously discussed.

## 2024-06-27 NOTE — Assessment & Plan Note (Signed)
 Chronic, remains diet controlled. Reviewed limiting added sugar/ carbs in diet.  RTC 6 mo DM f/u.

## 2024-06-27 NOTE — Assessment & Plan Note (Addendum)
 Preventative protocols reviewed and updated unless pt declined. Discussed healthy diet and lifestyle.  # provided to schedule mammogram and dexa scan. Discussed colonoscopy - will start with Cologuard this year.

## 2024-06-27 NOTE — Assessment & Plan Note (Signed)
 [place] nodule present Discussed topical voltaren use

## 2024-06-27 NOTE — Assessment & Plan Note (Signed)
 Chronic, continue atorvastatin  40mg  daily. The 10-year ASCVD risk score (Arnett DK, et al., 2019) is: 52.8%   Values used to calculate the score:     Age: 78 years     Clincally relevant sex: Female     Is Non-Hispanic African American: No     Diabetic: Yes     Tobacco smoker: No     Systolic Blood Pressure: 138 mmHg     Is BP treated: Yes     HDL Cholesterol: 53.5 mg/dL     Total Cholesterol: 159 mg/dL

## 2024-06-27 NOTE — Assessment & Plan Note (Addendum)
 IBS-D with ongoing symptoms managed with OTC imodium.

## 2024-06-27 NOTE — Assessment & Plan Note (Addendum)
 Reassess at 6 mo f/u visit

## 2024-06-27 NOTE — Assessment & Plan Note (Signed)
 Chronic, overall stable period on donepezil  10mg  nightly, tolerating well

## 2024-06-27 NOTE — Assessment & Plan Note (Signed)
 Continues escitalopram  20mg  daily with benefit - overall improved since transition off sertraline .  GI issues have not really changed.

## 2024-06-27 NOTE — Assessment & Plan Note (Signed)
 Chronic, stable. Continue current regimen.

## 2024-06-27 NOTE — Assessment & Plan Note (Signed)
Chronic, stable on omeprazole 20mg daily.  °

## 2024-06-27 NOTE — Assessment & Plan Note (Signed)

## 2024-06-27 NOTE — Assessment & Plan Note (Signed)
 Refer to dermatology. Overdue for f/u.

## 2024-06-27 NOTE — Assessment & Plan Note (Signed)
 Reviewed dietary calcium  intake and vit D replacement.  Ordered updated DEXA.

## 2024-07-16 LAB — COLOGUARD

## 2024-07-18 ENCOUNTER — Ambulatory Visit: Payer: Self-pay | Admitting: Family Medicine

## 2024-07-28 LAB — COLOGUARD

## 2024-08-20 ENCOUNTER — Encounter: Payer: Self-pay | Admitting: *Deleted

## 2025-06-27 ENCOUNTER — Ambulatory Visit

## 2025-06-28 ENCOUNTER — Ambulatory Visit
